# Patient Record
Sex: Male | Born: 1935 | Race: White | Hispanic: No | Smoking: Former smoker
Health system: Southern US, Community
[De-identification: ages and names within clinical notes are randomized; demographics above are authoritative.]

## PROBLEM LIST (undated history)

## (undated) DIAGNOSIS — IMO0002 Reserved for concepts with insufficient information to code with codable children: Secondary | ICD-10-CM

## (undated) DIAGNOSIS — I714 Abdominal aortic aneurysm, without rupture, unspecified: Secondary | ICD-10-CM

## (undated) DIAGNOSIS — C679 Malignant neoplasm of bladder, unspecified: Secondary | ICD-10-CM

## (undated) DIAGNOSIS — M7550 Bursitis of unspecified shoulder: Secondary | ICD-10-CM

## (undated) DIAGNOSIS — E785 Hyperlipidemia, unspecified: Secondary | ICD-10-CM

## (undated) DIAGNOSIS — R0609 Other forms of dyspnea: Secondary | ICD-10-CM

## (undated) DIAGNOSIS — R058 Other specified cough: Secondary | ICD-10-CM

## (undated) DIAGNOSIS — Z8719 Personal history of other diseases of the digestive system: Secondary | ICD-10-CM

## (undated) DIAGNOSIS — J479 Bronchiectasis, uncomplicated: Secondary | ICD-10-CM

## (undated) DIAGNOSIS — J449 Chronic obstructive pulmonary disease, unspecified: Secondary | ICD-10-CM

## (undated) DIAGNOSIS — R05 Cough: Secondary | ICD-10-CM

## (undated) DIAGNOSIS — E119 Type 2 diabetes mellitus without complications: Secondary | ICD-10-CM

## (undated) DIAGNOSIS — R351 Nocturia: Secondary | ICD-10-CM

## (undated) DIAGNOSIS — R0600 Dyspnea, unspecified: Secondary | ICD-10-CM

## (undated) DIAGNOSIS — R350 Frequency of micturition: Secondary | ICD-10-CM

## (undated) DIAGNOSIS — Z8711 Personal history of peptic ulcer disease: Secondary | ICD-10-CM

## (undated) DIAGNOSIS — M1990 Unspecified osteoarthritis, unspecified site: Secondary | ICD-10-CM

## (undated) DIAGNOSIS — Z973 Presence of spectacles and contact lenses: Secondary | ICD-10-CM

## (undated) DIAGNOSIS — R3915 Urgency of urination: Secondary | ICD-10-CM

## (undated) DIAGNOSIS — K219 Gastro-esophageal reflux disease without esophagitis: Secondary | ICD-10-CM

## (undated) DIAGNOSIS — L97529 Non-pressure chronic ulcer of other part of left foot with unspecified severity: Secondary | ICD-10-CM

## (undated) DIAGNOSIS — Z8679 Personal history of other diseases of the circulatory system: Secondary | ICD-10-CM

## (undated) HISTORY — PX: CATARACT EXTRACTION W/ INTRAOCULAR LENS  IMPLANT, BILATERAL: SHX1307

## (undated) HISTORY — DX: Chronic obstructive pulmonary disease, unspecified: J44.9

## (undated) HISTORY — PX: APPENDECTOMY: SHX54

---

## 1969-03-19 HISTORY — PX: OTHER SURGICAL HISTORY: SHX169

## 1979-03-20 HISTORY — PX: RETINAL DETACHMENT SURGERY: SHX105

## 1999-05-29 ENCOUNTER — Ambulatory Visit (HOSPITAL_COMMUNITY): Admission: RE | Admit: 1999-05-29 | Discharge: 1999-05-29 | Payer: Self-pay | Admitting: Gastroenterology

## 2000-01-22 ENCOUNTER — Encounter: Payer: Self-pay | Admitting: Family Medicine

## 2000-01-22 ENCOUNTER — Encounter: Admission: RE | Admit: 2000-01-22 | Discharge: 2000-01-22 | Payer: Self-pay | Admitting: Family Medicine

## 2000-06-01 ENCOUNTER — Encounter: Admission: RE | Admit: 2000-06-01 | Discharge: 2000-06-01 | Payer: Self-pay | Admitting: Family Medicine

## 2000-06-01 ENCOUNTER — Encounter: Payer: Self-pay | Admitting: Family Medicine

## 2001-07-28 ENCOUNTER — Emergency Department (HOSPITAL_COMMUNITY): Admission: EM | Admit: 2001-07-28 | Discharge: 2001-07-29 | Payer: Self-pay | Admitting: Emergency Medicine

## 2001-10-27 ENCOUNTER — Encounter: Admission: RE | Admit: 2001-10-27 | Discharge: 2001-10-27 | Payer: Self-pay | Admitting: Family Medicine

## 2001-10-27 ENCOUNTER — Encounter: Payer: Self-pay | Admitting: Family Medicine

## 2002-11-27 ENCOUNTER — Encounter: Payer: Self-pay | Admitting: Specialist

## 2002-12-05 ENCOUNTER — Inpatient Hospital Stay (HOSPITAL_COMMUNITY): Admission: RE | Admit: 2002-12-05 | Discharge: 2002-12-11 | Payer: Self-pay | Admitting: Specialist

## 2002-12-05 HISTORY — PX: TOTAL KNEE ARTHROPLASTY: SHX125

## 2002-12-07 ENCOUNTER — Encounter: Payer: Self-pay | Admitting: Specialist

## 2002-12-07 ENCOUNTER — Encounter: Payer: Self-pay | Admitting: Cardiology

## 2002-12-07 ENCOUNTER — Encounter: Payer: Self-pay | Admitting: Internal Medicine

## 2002-12-07 HISTORY — PX: TRANSTHORACIC ECHOCARDIOGRAM: SHX275

## 2002-12-11 ENCOUNTER — Inpatient Hospital Stay (HOSPITAL_COMMUNITY)
Admission: RE | Admit: 2002-12-11 | Discharge: 2002-12-19 | Payer: Self-pay | Admitting: Physical Medicine & Rehabilitation

## 2003-05-29 ENCOUNTER — Observation Stay (HOSPITAL_COMMUNITY): Admission: RE | Admit: 2003-05-29 | Discharge: 2003-05-30 | Payer: Self-pay | Admitting: Orthopedic Surgery

## 2003-05-29 ENCOUNTER — Encounter (INDEPENDENT_AMBULATORY_CARE_PROVIDER_SITE_OTHER): Payer: Self-pay | Admitting: Specialist

## 2003-05-29 HISTORY — PX: OTHER SURGICAL HISTORY: SHX169

## 2005-02-15 ENCOUNTER — Encounter: Admission: RE | Admit: 2005-02-15 | Discharge: 2005-02-15 | Payer: Self-pay | Admitting: Family Medicine

## 2005-03-02 ENCOUNTER — Ambulatory Visit (HOSPITAL_COMMUNITY): Admission: RE | Admit: 2005-03-02 | Discharge: 2005-03-03 | Payer: Self-pay | Admitting: Orthopedic Surgery

## 2005-03-02 HISTORY — PX: SHOULDER ARTHROSCOPY WITH ROTATOR CUFF REPAIR AND SUBACROMIAL DECOMPRESSION: SHX5686

## 2005-03-05 ENCOUNTER — Inpatient Hospital Stay (HOSPITAL_COMMUNITY): Admission: EM | Admit: 2005-03-05 | Discharge: 2005-03-12 | Payer: Self-pay | Admitting: Emergency Medicine

## 2005-07-15 ENCOUNTER — Encounter: Admission: RE | Admit: 2005-07-15 | Discharge: 2005-07-15 | Payer: Self-pay | Admitting: Family Medicine

## 2007-01-30 ENCOUNTER — Encounter: Admission: RE | Admit: 2007-01-30 | Discharge: 2007-01-30 | Payer: Self-pay | Admitting: Family Medicine

## 2007-05-04 ENCOUNTER — Encounter: Admission: RE | Admit: 2007-05-04 | Discharge: 2007-05-04 | Payer: Self-pay | Admitting: Family Medicine

## 2007-08-15 ENCOUNTER — Ambulatory Visit: Payer: Self-pay | Admitting: Vascular Surgery

## 2008-05-21 ENCOUNTER — Ambulatory Visit: Payer: Self-pay | Admitting: Vascular Surgery

## 2009-06-10 ENCOUNTER — Ambulatory Visit: Payer: Self-pay | Admitting: Vascular Surgery

## 2009-10-10 ENCOUNTER — Emergency Department (HOSPITAL_COMMUNITY): Admission: EM | Admit: 2009-10-10 | Discharge: 2009-10-10 | Payer: Self-pay | Admitting: Emergency Medicine

## 2010-01-23 ENCOUNTER — Ambulatory Visit (HOSPITAL_COMMUNITY): Admission: RE | Admit: 2010-01-23 | Discharge: 2010-01-23 | Payer: Self-pay | Admitting: Orthopedic Surgery

## 2010-06-22 ENCOUNTER — Ambulatory Visit: Payer: Self-pay | Admitting: Vascular Surgery

## 2010-08-09 ENCOUNTER — Encounter: Payer: Self-pay | Admitting: Orthopedic Surgery

## 2010-10-11 LAB — CBC
Hemoglobin: 14.6 g/dL (ref 13.0–17.0)
MCV: 95.4 fL (ref 78.0–100.0)
RBC: 4.51 MIL/uL (ref 4.22–5.81)
RDW: 13.9 % (ref 11.5–15.5)

## 2010-10-11 LAB — COMPREHENSIVE METABOLIC PANEL
AST: 36 U/L (ref 0–37)
Alkaline Phosphatase: 61 U/L (ref 39–117)
CO2: 28 mEq/L (ref 19–32)
Calcium: 9.7 mg/dL (ref 8.4–10.5)
GFR calc non Af Amer: >60 mL/min (ref >60)
Glucose, Bld: 122 mg/dL — ABNORMAL HIGH (ref 70–99)
Potassium: 4.6 mEq/L (ref 3.5–5.1)
Total Protein: 6.9 g/dL (ref 6.0–8.3)

## 2010-10-11 LAB — PROTIME-INR: Prothrombin Time: 14 seconds (ref 11.6–15.2)

## 2010-10-11 LAB — APTT: aPTT: 28 seconds (ref 24–37)

## 2010-11-19 ENCOUNTER — Other Ambulatory Visit: Payer: Self-pay | Admitting: Gastroenterology

## 2010-12-01 NOTE — Procedures (Signed)
DUPLEX ULTRASOUND OF ABDOMINAL AORTA   INDICATION:  Followup, abdominal aortic aneurysm.   HISTORY:  Diabetes:  No.  Cardiac:  Arrhythmia.  Hypertension:  No.  Smoking:  Quit about 10 years ago.  Connective Tissue Disorder:  Family History:  Previous Surgery:   DUPLEX EXAM:         AP (cm)                   TRANSVERSE (cm)  Proximal             2.93 cm                   2.95 cm  Mid                  3.37 cm                   3.51 cm  Distal               2.60 cm                   2.38 cm  Right Iliac          1.54 cm                   1.50 cm  Left Iliac           1.41 cm                   1.52 cm   PREVIOUS:  Date:  AP:  3.36  TRANSVERSE:  3.28   IMPRESSION:  Abdominal aortic aneurysm noted with the largest  measurement of (3.37 cm X 3.51 cm).   ___________________________________________  Quita Skye Hart Rochester, M.D.   MG/MEDQ  D:  08/15/2007  T:  08/16/2007  Job:  [id number]

## 2010-12-01 NOTE — Assessment & Plan Note (Signed)
OFFICE VISIT   Patrick Lane, Patrick Lane  DOB:  05/07/1928                                       05/21/2008  [id number]#:[id number]   The patient returns today for continued followup regarding his abdominal  aortic aneurysm which was discovered by Dr. Lajoyce Corners a few years ago.  He has had no abdominal or back symptoms and the duplex scan in our  office today reveals the aneurysm to be 3.6 x 3.5 cm maximum diameter  which is only slightly larger than the previous study done in January of  2009.  He continues to have no active cardiac symptoms but does have  emphysema which is a chronic problem.  He has a history of cardiac  arrhythmias in 2004 after his right knee replacement but this has not  recurred.  He denies any active chest pain at this point.  He does not  ambulate long distances because of his right knee but denies  claudication or rest pain.   PHYSICAL EXAMINATION:  Vital signs:  On physical exam today his blood  pressure is 163/77, heart rate 92, respirations 14.  Carotid pulses 3+,  no audible bruits.  Neurologic:  Exam is normal.  No palpable adenopathy  in the neck.  Chest:  Reveals some expiratory wheezing.  Cardiovascular:  Regular rhythm with no murmurs.  Abdomen:  Soft, nontender with no  pulsatile mass noted on exam today.  He has 3+ femoral pulses  bilaterally with well-perfused lower extremities.   I reassured him regarding these findings and we will follow him on an  annual basis on the aneurysm protocol to be certain that this does not  enlarge.  If he has any abdominal or back symptoms he will be in touch  with [place].   Quita Skye Hart Rochester, M.D.  Electronically Signed   JDL/MEDQ  D:  05/21/2008  T:  05/22/2008  Job:  [id number]

## 2010-12-01 NOTE — Procedures (Signed)
DUPLEX ULTRASOUND OF ABDOMINAL AORTA   INDICATION:  Abdominal aortic aneurysm.   HISTORY:  Diabetes:  No.  Cardiac:  Arrhythmia.  Hypertension:  No.  Smoking:  Previous.  Connective Tissue Disorder:  Family History:  No.  Previous Surgery:  No.   DUPLEX EXAM:         AP (cm)                   TRANSVERSE (cm)  Proximal             2.6 cm                    2.6 cm  Mid                  2.1 cm                    2.0 cm  Distal               3.7 cm                    3.7 cm  Right Iliac          1.4 cm                    1.5 cm  Left Iliac           1.5 cm                    1.8 cm   PREVIOUS:  Date:  06/10/2009  AP:  3.7  TRANSVERSE:  3.6   IMPRESSION:  Aneurysmal dilatation of the mid to distal abdominal aorta  with no significant change in the maximal diameter when compared to the  previous exam.  Decreased visualization of the bilateral common iliac  arteries noted due to overlying bowel gas patterns and patient body  habitus.   ___________________________________________  Patrick Lane, M.D.   CH/MEDQ  D:  06/22/2010  T:  06/22/2010  Job:  [id number]

## 2010-12-01 NOTE — Procedures (Signed)
DUPLEX ULTRASOUND OF ABDOMINAL AORTA   INDICATION:  Follow up abdominal aortic aneurysm.   HISTORY:  Diabetes:  No.  Cardiac:  Arrhythmia.  Hypertension:  No.  Smoking:  Previous.  Connective Tissue Disorder:  Family History:  No.  Previous Surgery:  No.   DUPLEX EXAM:         AP (cm)                   TRANSVERSE (cm)  Proximal             2.9 cm                    2.95 cm  Mid                  3.6 cm                    3.5 cm  Distal               2.6 cm                    2.8 cm  Right Iliac          1.3 cm                    1.4 cm  Left Iliac           1.5 cm                    1.5 cm   PREVIOUS:  Date: 08/15/2007  AP:  3.37  TRANSVERSE:  3.51   IMPRESSION:  Aneurysm of the mid to distal abdominal aorta with no  significant change in the maximum diameter measurement noted when  compared to the previous exam.   ___________________________________________  Quita Skye. Hart Rochester, M.D.   CH/MEDQ  D:  05/21/2008  T:  05/21/2008  Job:  [id number]

## 2010-12-01 NOTE — Procedures (Signed)
DUPLEX ULTRASOUND OF ABDOMINAL AORTA   INDICATION:  Followup of abdominal aortic aneurysm.   HISTORY:  Diabetes:  no  Cardiac:  Arrhythmia  Hypertension:  no  Smoking:  previous  Connective Tissue Disorder:  Family History:  no  Previous Surgery:  No   DUPLEX EXAM:         AP (cm)                   TRANSVERSE (cm)  Proximal             3.7 cm                    3.6 cm  Mid                  3.5 cm                    3.5 cm  Distal               2.4 cm                    2.5 cm  Right Iliac          1.5 cm                    1.5 cm  Left Iliac           1.02 cm                   1.6 cm   PREVIOUS:  Date:  AP:  3.6  TRANSVERSE:  3.5   IMPRESSION:  1. Abdominal aortic aneurysm with largest measurement of 3.5 x 3.5 cm.  2. Abdominal aortic aneurysm remaining stable with previous studies.   ___________________________________________  Quita Skye Hart Rochester, M.D.   CJ/MEDQ  D:  06/10/2009  T:  06/10/2009  Job:  [id number]

## 2010-12-04 NOTE — Discharge Summary (Signed)
NAME:  Patrick Lane, Patrick Lane NO.:  [id number]   MEDICAL RECORD NO.:  [id number]                   PATIENT TYPE:  IPS   LOCATION:  4145                                 FACILITY:  [facility]   PHYSICIAN:  Ranelle Oyster, M.D.             DATE OF BIRTH:  08/26/1927   DATE OF ADMISSION:  12/11/2002  DATE OF DISCHARGE:  12/19/2002                                 DISCHARGE SUMMARY   DISCHARGE DIAGNOSES:  1. Status post right total knee arthroplasty secondary to degenerative joint     disease.  2. History of chronic obstructive pulmonary disease.  3. History of new diagnosis of atrial fibrillation.  4. Anemia.  5. Insomnia.   HISTORY OF PRESENT ILLNESS:  The patient is a 77 year old white male with a  past medical history of severe right knee pain and emphysema, admitted on  Dec 05, 2002, for right total knee arthroplasty secondary to degenerative  joint disease by Dr. Ronnell Guadalajara.  The patient was placed on Lovenox for  deep venous thrombosis prophylaxis.  Physical therapy report at this time  states that the patient is ambulating moderate assistance 10 feet with a  rolling walker, is touchdown weightbearing, and has a knee immobilizer.   Hospital course was significant for anemia, chronic obstructive pulmonary  disease exacerbation, newly diagnosed atrial fibrillation, and Coumadin for  deep venous thrombosis prophylaxis.  The patient was placed on Coumadin for  deep venous thrombosis prophylaxis and cerebrovascular accident prophylaxis,  started on prednisone for chronic obstructive pulmonary disease  exacerbation.  The patient was transferred to the [facility]  Department on Dec 11, 2002.   PAST MEDICAL HISTORY:  Significant for as above.  Denies any coronary artery  disease, hypertension, cerebrovascular accident, diabetes mellitus.   ALLERGIES:  CELEBREX.   REVIEW OF SYMPTOMS:  Significant for chest pain and shortness of breath.   PAST SURGICAL HISTORY:  1. Bilateral cataracts.  2. Appendectomy.  3. Detached retina.   PRIMARY CARE PHYSICIAN:  L. Lupe Carney, M.D.   ADMISSION MEDICATIONS:  1. Neurontin 400 mg at bedtime.  2. Lodine 40 mg b.i.d.   SOCIAL HISTORY:  The patient lives with wife in an one level home in  [place], with 3-4 steps at entry, independent prior to admission,  still driving.  Wife able to assist.  Quit smoking.  Retired Psychologist, forensic.  Occasional alcohol use.   HOSPITAL COURSE:  Patrick Lane was admitted to [facility] Department on Dec 11, 2002, for a comprehensive inpatient  rehabilitation where he received more then three hours of therapy daily.  Hospital course was significant for the following:   Problem #1.  Status post right total knee arthroplasty:  Overall, Patrick Lane  made great progress during his eight day stay in rehabilitation.  He  remained touchdown weightbearing throughout his entire stay, and Dr. Montez Morita  advised the patient to  keep the knee immobilizer on at all times with no  active range of motion, and to follow up with him.  The patient's pain was  controlled on OxyContin with oxycodone.  The surgical incision healed well.  Staples were removed prior to discharge, and showed no signs of infection.   The patient was discharged at modified independent level, was ambulating 100  feet with standard walker.  The patient was overall able to tolerate therapy  very well and made good progress.   Problem #2.  Histor of chronic obstructive pulmonary disease with recent  exacerbation:  The patient was continued on his prednisone taper and, on  admission, was started on Atrovent, albuterol, as well as Humibid LA.  The  patient had significant shortness of breath and coughing while in  rehabilitation.  Atrovent and albuterol were discontinued on Dec 13, 2002,  and he was started on Combivent two puffs t.i.d.  Humibid was also   discontinued on Dec 17, 2002.   Problem #3.  Deep venous thrombosis/cerebrovascular accident prophylaxis:  The patient remained on Coumadin throughout his entire stay in  rehabilitation.  Unsure if the patient will need to be on Coumadin  indefinitely or for a long period of time due to new diagnosis of atrial  fibrillation.  Dr. Clovis Riley can made a decision on how long the patient  should be on Coumadin or if the patient is to be on Coumadin chronically.   Problem #4.  Anemia:  The patient remained on Trinsicon one tablet p.o.  b.i.d.  The patient had an initial hemoglobin of 10.8, and hematocrit of  31.2.  No bleeding complications were noted while the patient was on  Coumadin.   Problem #5.  New diagnosis of atrial fibrillation:  The patient's heart  remained in regular rate and rhythm.  He remained on Cardizem 180 mg p.o.  daily.  The patient is to follow up with Dr. Lupe Carney at time of  discharge.   Problem #6.  Insomnia:  The patient did complain of inability to sleep.  He  remained on Desyrel 100 mg p.o. at bedtime throughout his entire stay in  rehabilitation.  The patient was able to sleep after Desyrel was increased  from 50 mg to 100 mg.   Of note, the patient did receive Kayexalate by accident on December 18, 2002.  Stat potassium was performed on December 18, 2002, and was 4.5.  The patient did  not experience any significant bowel movement or diarrhea.   LABORATORY DATA:  Latest laboratories indicate the patient's hemoglobin is  10.8, hematocrit 31.2, white blood cell count 9.7, platelet count 400.  Latest sodium 140, potassium 3.9, chloride 102, CO2 31, glucose 95, BUN 17,  creatinine 0.9.  The patient had an urine culture sent on Dec 11, 2002, no  growth x1 day.  The patient's latest AST was 22, ALT 29.   At time of discharge, all vitals were stable.  Physical therapy report  indicated that the patient was ambulating approximately 150 feet with modified independence  with a standard walker, he could transfer sit-to-stand  modified independent.  Bed mobility was modified independent.  The patient  could perform most activities of daily living modified independent.  The  patient is discharged home with his family.   DISCHARGE MEDICATIONS:  1. Cardizem 180 mg one tablet daily.  2. Neurontin 400 mg at bedtime.  3. Prednisone follow taper.  4. Trinsicon one tablet b.i.d.  5. Combivent two puffs t.i.d.  6. Desyrel 100 mg one tablet q.p.m.  7. OxyContin 10 mg q.12 h.  8. Oxycodone 5-10 mg 1-2 tablets q.4-6 h. p.r.n.  9. Coumadin 7.5 mg daily.  10.      No Lodine, no ice, no ibuprofen or Aleve while on Coumadin.   PAIN MANAGEMENT:  OxyContin, oxycodone, and Tylenol.   DISCHARGE INSTRUCTIONS:  No drinking alcohol, no smoking, no driving.   ACTIVITY:  He is to use a walker.  He is touchdown weightbearing.  Needs  knee immobilizer at all times.  The patient will have [facility]  for physical therapy and occupational therapy, R.N.  R.N. to monitor  Coumadin and INR, and to draw it on Thursday, December 21, 2002, and call results  to Dr. Lupe Carney.   FOLLOWUP:  1. He is to follow up with Dr. Lupe Carney in three weeks.  Call for an     appointment.  2. Follow up with Dr. Myrtie Neither this week.  3. Follow up with Dr. Riley Kill p.r.n.     Loyce L. Manson Passey, P.A.-C                    Ranelle Oyster, M.D.    Joya San  D:  12/19/2002  T:  12/19/2002  Job:  [id number]   cc:   L. Lupe Carney, M.D.  [address] [place]  [place] [address]  Fax: [phone]   Ronnell Guadalajara, M.D.  [address]  [place]  [place] [address]  Fax: [phone]

## 2010-12-04 NOTE — H&P (Signed)
NAME:  ADIT, RIDDLES NO.:  [id number]   MEDICAL RECORD NO.:  [id number]                   PATIENT TYPE:  INP   LOCATION:  NA                                   FACILITY:  [facility]   PHYSICIAN:  Ronnell Guadalajara, M.D.                DATE OF BIRTH:  1928/01/09   DATE OF ADMISSION:  12/05/2002  DATE OF DISCHARGE:                                HISTORY & PHYSICAL   CHIEF COMPLAINT:  Right knee pain.   HISTORY OF PRESENT ILLNESS:  The patient is a 77 year old male with a long  history of severe right knee pain.  He complains of popping and giving way  over the lateral joint and cannot hardly get around.  Back in the 1970s he  had a compound fracture of the distal femur treated by Dr. Fannie Knee.  He said  that the pin kept slipping in and out and eventually he had the pin taken  out.  He had a patellectomy on that side.  He is to the point now where the  pain is interfering with his daily life and he wishes to have something  definitively done.  Dr. Montez Morita felt it was best to go ahead and undergo  right total knee replacement.  Risks and benefits of the surgery are  discussed with the patient and the patient wishes to proceed.   PAST MEDICAL HISTORY:  Emphysema.   PAST SURGICAL HISTORY:  1. Right knee surgery.  2. Appendectomy.   ALLERGIES:  CELEBREX causes a rash.   SOCIAL HISTORY:  Quit smoking two years ago.  Positive social ETOH.  Positive social alcohol.  He is married and lives in a one story house with  three steps entering the house.   FAMILY HISTORY:  Mother deceased with breast cancer.  Father unremarkable.   REVIEW OF SYSTEMS:  GENERAL:  Denies fevers, chills, night sweats, bleeding  tendencies.  CNS:  Denies blurry or double vision, seizures, headaches,  paralysis.  RESPIRATORY:  Positive shortness of breath with exertion.  Denies productive cough, hemoptysis.  CARDIOVASCULAR:  Denies chest pain,  angina, or orthopnea.  GASTROINTESTINAL:  Denies  nausea, vomiting, diarrhea,  constipation, melena, bloody stools.  GENITOURINARY:  Denies dysuria or  discharge.  MUSCULOSKELETAL:  Pertinent to HPI.   PHYSICAL EXAMINATION:  VITAL SIGNS:  Blood pressure 150/70, pulse 96,  respirations 16.  GENERAL:  Well-developed, well-nourished, 77 year old male.  HEENT:  Normocephalic, atraumatic.  Pupils equal, round, and react to light.  NECK:  Supple.  No carotid bruit noted.  CHEST:  Equal breath sounds bilaterally.  Positive wheezes in lung bases.  HEART:  Regular rate and rhythm.  No murmurs, rubs, or gallops.  ABDOMEN:  Mild, nontender, nondistended.  Positive bowel sounds x4.  EXTREMITIES:  Tender to palpation in bilateral joint lines.  Decreased range  of motion.  Pain on range of motion.  Range of motion is from  0-9 degrees.  He is neurovascularly intact distally.  SKIN:  No rashes or lesions.  X-rays reveal severe bone-on-bone contact with  the right knee.    IMPRESSION:  1. Osteoarthritis, right knee.  2. Emphysema.   PLAN:  The patient will be admitted to the [facility] on Dec 05, 2002, and undergo right total knee arthroplasty by Dr. Debria Garret.        Clarene Reamer, P.A.-C.                   Ronnell Guadalajara, M.D.    SW/MEDQ  D:  11/29/2002  T:  11/29/2002  Job:  [id number]

## 2010-12-04 NOTE — Op Note (Signed)
NAME:  Patrick Lane, Patrick Lane NO.:  [id number]   MEDICAL RECORD NO.:  [id number]                   PATIENT TYPE:  AMB   LOCATION:  DAY                                  FACILITY:  [facility]   PHYSICIAN:  Madlyn Frankel. Charlann Boxer, M.D.               DATE OF BIRTH:  [birth year]   DATE OF PROCEDURE:  05/29/2003  DATE OF DISCHARGE:                                 OPERATIVE REPORT   PREOPERATIVE DIAGNOSES:  Arthrofibrosis, status post right total knee  replacement, associated with painful saphenous neuroma.   POSTOPERATIVE DIAGNOSES:  Arthrofibrosis, status post right total knee  replacement, associated with painful saphenous neuroma.   OPERATION/PROCEDURE:  1. Excision of right saphenous neuroma.  2. Arthroscopic lysis of adhesions with manipulation under anesthesia.   SURGEON:  Madlyn Frankel. Charlann Boxer, M.D.   ANESTHESIA:  Epidural plus MAC.   ESTIMATED BLOOD LOSS:  Minimal.   COMPLICATIONS:  None apparent.   DISPOSITION:  Stable to recovery room.   INDICATIONS FOR PROCEDURE:  Patrick Lane is a pleasant 77 year old gentleman  who is status post right total knee replacement in May 2004.  The patient  presented to the clinic after the procedure with complaints of stiff range  of motion and pain with maximum range of motion to 85 degrees of flexion and  pain over the medial femoral condyle.  Note that the patient's case is  interesting in that he has had a patellectomy performed in 1970.  He went on  to develop osteoarthritis and requested a total knee replacement by Dr.  Aneta Mins _____.  After discussing with the patient the fact that he has  probably reached maximum benefit and his maximum gained range of motion, his  major concern was the pain with the range of motion.  Based on this, it was  indicated to try to get some range of motion.  His preoperative range of  motion from his preoperative range of motion for the patient was about 120  degrees.  Notes from physical therapy  have revealed that he had maxed out at  90 degrees, but then slowly had been subsiding to about 80-85 degrees.   DESCRIPTION OF PROCEDURE:  The patient was brought to the operating theater.  Once adequate anesthesia and preoperative antibiotics were administered, the  patient was positioned supine on the operating table.  Right lower extremity  was then prepped and draped in the sterile fashion.   Attention was first directed to the neuroma.  He had a palpable nodule over  the medial femoral condyle and it looked grossly painful.  The patient  reported that prior to the surgical procedure, in that at night he had  intense throbbing pain right over this area.  Shelf dissection was carried  down and this nodule was identified and dissected back in its course.  It  was excised and then retipped and buried into the vastus medialis; umbilicus  was  present.  Following this, this wound was irrigated with normal saline  solution and wound was reapproximated with 3-0 Vicryl and 3-0 nylon.   At this point, an attempted at manipulation under anesthesia was carried  out.  The patient had pretty rigid block at 85 degrees of flexion.  Based on  this and his previous history of patellectomy, it was opted to do go  directly to arthroscopic lysis of adhesions.   Arthroscopic portals were created in a standard fashion with inferolateral  and inferolateral cannula and inflow and outflow superolateral and working  portal inferomedial.  Evaluation of the knee revealed not an overabundance  amount of scar tissue but enough present in the anterior and medial aspects  of the knee as well as in the suprapatellar pouch to warrant my debridement.  A _2 shaver was introduced to debride and lyse adhesions present in the  suprapatellar pouch.  The medial and lateral gutters as well as the anterior  aspect of the knee.  Following this careful debridement to avoid damage to  knee replacement component, a second  manipulation was carried out.  With  this manipulation, give of the knee with some lysis of further adhesions  allowing passive flexion to 110 degrees in the operating room.  With this to  prevent further damage or potential damage to the extension mechanism, the  wound was drained and the portal reapproximated using 3-0 nylon.  Following  this, the wounds dried and cleaned and dressed with Adaptic dressing,  dressing sponges, bulky dressing, and ice pack.  The patient was taken to  the recovery room.   PLAN:  The plan will require the patient to be on epidural anesthesia for  CPM for one to two days followed by discharge with CPM at home.  We will put  home health and outpatient physical therapy to try to improve his range of  motion, hopefully with excision of the neuroma, his pain on the medial side  will dissipate and we will be able to maximize his range of motion and be  satisfied. Given what was seen, it is very unlikely that he will get maximum  range of motion of this knee, but I feel that if we can eliminate his source  of pain, he would be satisfied.                                               Madlyn Frankel Charlann Boxer, M.D.    MDO/MEDQ  D:  05/29/2003  T:  05/29/2003  Job:  [id number]

## 2010-12-04 NOTE — H&P (Signed)
NAME:  Patrick Lane, Patrick Lane NO.:  [id number]   MEDICAL RECORD NO.:  [id number]          PATIENT TYPE:  INP   LOCATION:  1844                         FACILITY:  [facility]   PHYSICIAN:  Theone Stanley, MD   DATE OF BIRTH:  [birth year]   DATE OF ADMISSION:  03/05/2005  DATE OF DISCHARGE:                                HISTORY & PHYSICAL   CHIEF COMPLAINT:  Fever, chills, cough and chest tightness.   HISTORY OF PRESENT ILLNESS:  Patrick Lane is a very pleasant 77 year old  gentleman who recently had a left shoulder arthroscopy and debridement of  rotator cuff. Arthroscopic subthoracmal decompression and open resection  distal left clavicle by Dr. Simonne Come on March 02, 2005.  The patient went  through surgery without any difficulty. Postoperatively he had some  frequency and he was seen by Dr. Early Osmond. According to the wife and patient  he was actually doing quite well on his discharge. The patient normally does  have cough with some sputum, however since his discharge (which is the only  thing he noted), he had increased sputum production. Friday he had a sudden  change in his health including fever, chills, right leg numbness which has  resolved. A tight chest which when he coughs up his sputum he feels it  improves. The color of his sputum has now changed to a yellow-green and  there is increased sputum production. The patient was brought to the  emergency room and on evaluation a CT angiogram of the chest was performed  which did not show any pulmonary embolism. However it did show bilateral  pneumonia in the lower lobes with a possible cavitary component on the left.  Blood cultures were obtained and patient was started on antibiotics. At this  point in time the patient will be admitted.   PAST MEDICAL HISTORY:  1.  Significant for COPD. He normally only takes a Combivent inhaler at      home.  2.  Hypercholesterolemia.  3.  Recent diagnosis of benign prostatic  hypertrophy.   MEDICATIONS:  1.  Combivent.  2.  Lipitor 10 mg daily.  3.  Discharged on a multivitamin.  4.  Tylox.  5.  Robaxin.  6.  Flomax.   ALLERGIES:  CELEBREX - CAUSED HIVES AND ITCHING ABOUT 2 HOURS AFTER TAKING  THE MEDICATION.   PAST SURGICAL HISTORY:  A total knee in 2004 which is followed by Dr. Charlann Boxer.  Recent rotator cuff surgery by Dr. Simonne Come.   FAMILY HISTORY:  None.   SOCIAL HISTORY:  The patient lives in [place]. He is married and they had  children but they died in an auto accident. The patient quit smoking 5 years  ago. Prior to that he smoked one pack per day for about 50 years. He  occasionally drinks alcohol. No illicit drug use.   REVIEW OF SYSTEMS:  Please see HPI. In addition patient said his right ankle  is causing him pain. He has had this before, many years ago and was given  Prednisone and it resolved.   PHYSICAL EXAMINATION:  VITAL SIGNS:  Original temperature  of 102.9, repeat  99.6. Blood pressure originally was 150/70, repeat 132/62, pulse of 140,  repeat 107, respirations 22, repeat of 18. Saturating 92% on 2 liters.  HEENT:  Of note, the patient had some ptosis on the right side, however,  this is now new. 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 His pupil was irregular and nonreactive, however, I think this is  secondary to surgery. The left eye was equal, reactive to light and 3 mm.  Extraocular movements are intact. Ears without discharge. Throat clear,  mucosa appeared slightly dry.  NECK:  Supple, no lymphadenopathy.  HEART:  Regular rate and rhythm, no murmurs or gallops appreciated.  LUNGS:  Rhonchi bilaterally. Upper respiratory sounds.  ABDOMEN:  Soft, nontender, nondistended.  EXTREMITIES:  Right ankle appeared slightly more swollen than the left, pain  on palpation and slightly warm to the touch, however was not erythematous  and without evidence of cellulitis.  NEURO:  The patient is alert and  oriented x3, nonfocal.  GU:  Deferred.   LABS/RADIOLOGY:  White count 9 thousand, hemoglobin 13, hematocrit 40,  platelets of 222,000. INR of 1.2. Sodium 138, potassium 4.2, chloride 99,  CO2 of 29, glucose 183, BUN 9, creatinine 1.  Cardiac enzymes x3 were negative. Urinalysis showed a specific gravity of  1.035, pH of 5.5, small amount of bilirubin, wbc's 0-2/hpf, few bacteria. CT  report as indicated in the HPI.   ASSESSMENT/PLAN:  1.  Bilateral pneumonia. There is a suspicion for aspiration. The patient      denies any cough or any evidence of aspiration. I suspect that this      could be secondary to status post surgery. However to be on the safe      side I have written for a speech therapy consult for aspiration. In the      meantime I will start him on Imipenem and azithromycin. Nebulizers,      oxygen and flutter-valve. I emphasized both to the wife and the patient      that it would be very important to use his flutter-valve and try to      bring up as much sputum as possible. Blood culture have been sent, this      will need to be followed up on.   1.  Status post left rotator cuff surgery. The patient will be started on MS      Contin to provide a longer acting pain control and morphine p.r.n. Will      continue the Robaxin 500 q.6h p.r.n. and will try to inform Dr.      Simonne Come of patient's admission out of courtesy.   1.  Benign prostatic hypertrophy. I suspect this is what the patient had on      his last admission. He was started on Flomax and currently his frequency      problems have resolved. Will continue this while he is here in the      hospital.   1.  Chronic obstructive pulmonary disease. Continue on his nebulizers,      oxygen p.r.n.   1.  Hyperlipidemia. Continue on his Lipitor 10 mg one p.o. q.h.s.   1.  For prophylaxis the patient will be started on Protonix and Lovenox      subcutaneously.      Theone Stanley, MD  Electronically  Signed     AEJ/MEDQ  D:  03/05/2005  T:  03/05/2005  Job:  [id number]   cc:   L. Lupe Carney, M.D.  [address] [place]  [place] [address]  Fax: [phone]   Marlowe Kays, M.D.  [address]  [place]  [place] [address]  Fax: [phone]

## 2010-12-04 NOTE — Op Note (Signed)
NAME:  Patrick Lane, Patrick Lane NO.:  [id number]   MEDICAL RECORD NO.:  [id number]          PATIENT TYPE:  AMB   LOCATION:  DAY                          FACILITY:  [facility]   PHYSICIAN:  Marlowe Kays, M.D.  DATE OF BIRTH:  10-Oct-1927   DATE OF PROCEDURE:  03/02/2005  DATE OF DISCHARGE:                                 OPERATIVE REPORT   PREOPERATIVE DIAGNOSES:  1.  Degenerative arthritis acromioclavicular joint.  2.  Chronic impingement syndrome with rotator cuff tendinopathy, left      shoulder.   POSTOPERATIVE DIAGNOSIS:  1.  Degenerative arthritis acromioclavicular joint.  2.  Chronic impingement syndrome with rotator cuff tendinopathy, left      shoulder.   OPERATION:  1.  Left shoulder arthroscopy with debridement of rotator cuff, glenoid      labrum.  2.  Arthroscopic subacromial decompression.  3.  Open resection distal left clavicle.   SURGEON:  Marlowe Kays, M.D.   ASSISTANTDruscilla Brownie. Idolina Primer, P.A.-C.   ANESTHESIA:  General.   JUSTIFICATION FOR PROCEDURE:  He is having severe pain and both shoulders,  the left worse than the right with an MRI of the left shoulder on February 24, 2005, demonstrating tendinopathy of the biceps, subscapularis and rotator  cuff tendons with a joint effusion, subacromial bursitis and a fairly  extensive AC joint degenerative arthritis, which was visible on x-ray.  This  was also associated type 2 acromion.   DESCRIPTION OF PROCEDURE:  Prophylactic antibiotics.  He has a knee  replacement.  Satisfactory general anesthesia, beach-chair position on this  spine frame.  Left shoulder girdle was prepped with DuraPrep and draped in  sterile field.  The anatomy of the shoulder joint was marked out and  prospective incision for the distal clavicle excision, the lateral posterior  portal sites and subacromial decompression were all infiltrated with 0.5%  Marcaine with Adrenaline.  Through the posterior soft spot portal, I  atraumatically entered glenohumeral joint.  On  inspection there was some  fraying of the rotator cuff with a disruption of the labrum, particularly  the biceps tendon attachment and what appeared to be a piece of suture  material in the joint.  This was very thin and probably was several  centimeters long.  He has never had a prior shoulder surgery.  We documented  this with pictures.  I then advanced the scope between the biceps and  subscapularis anteriorly.  I made an anterior incision over a switching  stick and then placed a metal cannula followed by 4.2 shaver which I  debrided down the rotator cuff, the labrum, and we searched extensively for  this foreign body, what appeared to be foreign body material, and the joint  appeared to be free, so we assumed that it had irrigated out or evacuated  with the suction shaver.  Then we redirected the scope in the subacromial  area anterolateral portal.  I placed a 4.2 shaver. He had a very extensive  subacromial bursitis and after cleaning this out I was able to get enough  visualization to bring in the 90  degree [employer] vaporizer, and I then  began removing some soft tissue from these undersurface of the acromion.  I  then brought in the 4 mm oval bur and began burring down the acromion, and I  went back and forth between these three instruments until we had a wide  decompression.  This was documented with pictures with his arm to his side  and arm abducted.  I then made an open incision over the distal clavicle.  The [facility] joint was badly arthritic.  I measured off the distal 1.5 cm, marked  the clavicle at this point and then protecting the underneath clavicle with  Baby Bennett's, used a micro saw to amputate the clavicle.  I then removed  the cut fragment with towel clip and cautery.  I made sure there was no  remaining bony spicules on the parent clavicle which I then covered with  bone wax.  The wound was irrigated well with sterile  saline and the gap  filled with Gelfoam.  I then closed the interval with interrupted #1 Vicryl  and the fascia with 2-0 Vicryl and the subcu tissue, Steri-Strips on the  skin and 4-0 nylon in the portals.  All of portals in the incision and  subacromial space were likewise reinjected with 0.5% plain Marcaine,  Marcaine with Adrenaline prior to closure.  Betadine and  Adaptic were  applied over the portals. Dry sterile dressing over the clavicle excision  site.  Shoulder immobilizer applied.  He tolerated the procedure well and  was taken to the recovery room in satisfactory condition with no known  complications.           ______________________________  Marlowe Kays, M.D.     JA/MEDQ  D:  03/02/2005  T:  03/02/2005  Job:  [id number]

## 2010-12-04 NOTE — Discharge Summary (Signed)
NAME:  Patrick Lane, Patrick Lane NO.:  [id number]   MEDICAL RECORD NO.:  [id number]                   PATIENT TYPE:  INP   LOCATION:  0377                                 FACILITY:  [facility]   PHYSICIAN:  Ronnell Guadalajara, M.D.                DATE OF BIRTH:  1927-11-24   DATE OF ADMISSION:  12/05/2002  DATE OF DISCHARGE:  12/11/2002                                 DISCHARGE SUMMARY   ADMISSION DIAGNOSES:  1. Osteoarthritis right knee.  2. Emphysema.  3. Chronic obstructive pulmonary disease.   DISCHARGE DIAGNOSES:  1. Osteoarthritis right knee.  2. Emphysema.  3. Chronic obstructive pulmonary disease.  4. Mild postoperative anemia.  5. Hypoxic episode secondary to the chronic obstructive pulmonary disease     and emphysema postoperatively.  6. New onset atrial fibrillation, resolved.   OPERATION:  On Dec 05, 2002 the patient underwent right total knee  replacement arthroplasty with tibial tubercle osteotomy.   ASSISTANT:  Dr. Simonne Come assisted.   CONSULTS:  1. Rehabilitation Medicine, [facility].  2. Dr. Greggory Stallion Osei-Bonsu of internal medicine for Dr. Lupe Carney.   BRIEF HISTORY:  This 77 year old white male with progressive problems  concerning his right knee.  He has popping, giving way of the joint.  He has  severe pain with any weightbearing and range of motion of the knee.  X-rays  have showed deteriorating joint cartilage as well as tricompartmental  arthritis of the knee.  After much discussion and taking into consideration  the patient's inability to really get about despite the fact that he is a  very active fellow it was felt that he would benefit with surgical  intervention and he was admitted for the above procedure.   HOSPITAL COURSE:  Problem 1.  The patient tolerated the surgical procedure  quite well.  Unfortunately he became hypoxic during the night.  We had to  keep his oxygen about 6 liters to keep his O2 sats at 94.  We asked  for and  received the medical consult and medical management from the hospitalists of  [facility] at [facility].  Dr. Julio Sicks continued to follow the  patient throughout this hospitalization and adjusting his medication.   Problem 2.  On the third post-op day the patient became quite uncomfortable,  difficult in maintaining a awareness.  Cardiology was asked to see the  patient.  Atrial fibrillation was noted on his electrocardiogram.  An echo  was done which was essentially normal.  The medical physicians adjusted his  medications and he eventually was able to become more alert and cooperative  and working with physical therapy.  It was discussed with the patient might  be able to go home with his home health however, with the respiratory  problems as well as the fact that he had missed a considerable amount of  postoperative physical therapy that he would need an inpatient  rehabilitation.  He was seen by Dr. Thomasena Edis of [facility] and felt  that he would be an excellent candidate for the program.  Arrangements were  made for that final transfer.   Problem 3.  Orthopedically the patient progressed very nicely up until his  episode with hypoxia, etcetera and atrial fib.  Once he was able to get back  on track as far as total knee protocol was concerned we began the CTM.  The  patient able to do minimal amount of straight leg raise.  The wound was dry.  Neurovascular was intact to the left lower extremity .   Problem 4.  Medical and cardiology felt the patient was stable enough for  transfer and arrangements were made for that inpatient status.   LABORATORY VALUES IN THE HOSPITAL:  Hematologic showed a CBC with  differential completely within normal limits preoperatively.  The patient's  final hemoglobin was 10.2, hematocrit was 29.7.  Blood chemistry remained  essentially normal other then a very minimally depressed sodium 134.  The  cardiac markers showed an  elevated troponin I, the highest on these records  at 0.13.  Urinalysis essentially negative for urinary tract infection when  repeated.   The echocardiogram showed normal left ventricle, overall left ventricle  systolic function was normal.  Left ventricular ejection fraction was  estimated between 55-65%.  The electrocardiogram preoperatively showed  normal sinus rhythm, left axis deviation however, on May 21 there was atrial  fibrillation with rapid ventricular response.  After reviewing these strips  while he was on monitoring it was a sinus rhythm was seen.   The spiral CT with contrast was negative for acute pulmonary embolus.   Chest x-ray on May 11 showed heart size is normal, central pulmonary artery  is prominent, hilar and mediastinal contours are otherwise unremarkable.  Lungs remained clear.  Degenerative changes were noted to the thoracic  spine.  The chest x-ray done during his hypoxic episode showed no acute  abnormalities.   CONDITION ON DISCHARGE:  Improved and stable.   PLAN:  The patient will be transferred to [facility] for an  intensive inpatient rehabilitation program, to continue with his total knee  protocol.  We will certainly continue to ask the hospitalists of the [facility] to follow along with this patient.  When transferred he  was awake, alert, sitting in the chair very conversant and fully oriented.  He was having no dyspnea at the time of transfer.  Recommend he continue  with the  protocol as outline above.  Weightbearing as tolerated.  Sutures out in two  and a half weeks.  Return to see Dr. Montez Morita after his rehab stay.  We will  certainly follow along with the other physicians during this patient's  hospitalization at [facility].      Dooley L. Cherlynn June.                 Ronnell Guadalajara, M.D.    DLU/MEDQ  D:  12/26/2002  T:  12/26/2002  Job:  [id number]   cc:   L. Lupe Carney, M.D. [address] [place]  [place] [address]  Fax: [phone]

## 2010-12-04 NOTE — Discharge Summary (Signed)
NAME:  RAMSAY, BOGNAR NO.:  [id number]   MEDICAL RECORD NO.:  [id number]          PATIENT TYPE:  INP   LOCATION:  3703                         FACILITY:  [facility]   PHYSICIAN:  Sherin Quarry, MD      DATE OF BIRTH:  10/10/1927   DATE OF ADMISSION:  03/05/2005  DATE OF DISCHARGE:  03/12/2005                                 DISCHARGE SUMMARY   Patrick Lane is a 77 year old man who had recently undergone left  shoulder arthroscopy and debridement of his rotator cuff. This procedure had  been perform on March 02, 2005. After his discharge, he began to experience  increased cough with sputum production and then on March 04, 2005 he began  to experience fever, chills, increase chest tightness, and a cough  productive of yellowish phlegm. The patient was brought to the emergency  room. In light of his recent surgery, he underwent a CT of the chest which  showed no signs of pulmonary embolus. However, the CT of the chest did show  evidence of bilateral pneumonia involving the lower lobes. For this reason,  it was felt prudent to admit the patient to the hospital for further  treatment.   Physical exam at time of admission as described by Dr. Theone Stanley.  Temperature was 102.9, blood pressure was 150/70, pulse was initially 140  but at the time that she saw the patient was 107, respirations were 22, O2  saturation was 92% 2 liters. HEENT exam was remarkable for previous retinal  detachment involving the right eye with irregular pupil. The chest was  notable for bibasilar rhonchi with upper respiratory mild expiratory  wheezing. Cardiovascular revealed normal S1-S2 and no rubs, murmurs or  gallops. The abdomen was soft. It was nontender. There was no guarding or  rebound. Neurologic testing was within normal limits. The patient was alert  and oriented. On examination of the extremities, the patient complained of  mild discomfort in his feet which were diffusely  somewhat sore to palpation.   On admission, his white count was 9000, hemoglobin 13, platelet count was  222,000. Sodium was 138, potassium 4.2, glucose was 183, creatinine is 1,  BUN was 9. Serial cardiac enzymes were negative.   On admission, Dr. Jomarie Longs placed the patient on imipenem 500 milligrams every  6 hours and azithromycin 500 milligrams IV daily. This combination of  antibiotics was chosen because of concern about possible aspiration.  Subsequently, on March 06, 2005, Dr. Nehemiah Settle changed the patient's  antibiotic therapy to Avelox 400 milligrams IV daily and clindamycin 60  milligrams IV every 6 hours. The patient did remarkably well during his  hospitalization from the standpoint of his respiratory status. His breathing  improved and productive cough gradually resolved. He was having no  respiratory difficulty during the latter part of hospitalization.   His main problem was persistent increased foot pain. Foot pain gradually  became so severe that he had difficulty walking. There was no evidence  impairment of circulation. The foot pain was initially confined to the right  foot then the pain in the right foot resolved  and the patient complained of  pain in his left foot. Relevant laboratory studies obtained included a uric  acid level 5.2. Sedimentation rate of 94. I initially tried to treat the  patient's foot pain symptomatically; however, because the pain was extremely  troublesome and disabling, on March 10, 2005, I elected to a treated  empirically as if it represented gout even though the uric acid level was  normal. I gave him prednisone 30 milligrams x1 and then 20 milligrams the  next day. I also placed him on colchicine 0.6 milligrams b.i.d. On this  regimen, his foot pain rapidly got better. By March 11, 2005, he was  walking just fine and had very minimal pain. He wanted very strongly to go  home and therefore on March 11, 2005 the patient was discharged.    DISCHARGE DIAGNOSES:  1.  Postoperative pneumonia, possibly secondary to aspiration.  2.  Recent history of left rotator cuff surgery.  3.  Benign prostatic hypertrophy.  4.  Chronic obstructive pulmonary disease.  5.  Hyperlipidemia.  6.  Migratory foot pain possibly representing gout or pseudogout.   On discharge, the patient will continue his usual medicines, i.e. Lipitor 10  milligrams daily and a Combivent inhaler. He will also take Avelox 400  milligrams daily for five additional days. He is given prednisone 10  milligrams x 1 day, 5 milligrams x 1 day and then stop. He is also given  colchicine 0.6 milligrams b.i.d. with instructions to take this medicine for  a total of 7 days. I advised him to follow up with Dr. Lupe Carney in his  office when it was convenient next week. At the time of the patient's  discharge, he had very minimal foot pain and no objective joint swelling in  the feet. Condition at time of discharge was fair.           ______________________________  Sherin Quarry, MD     SY/MEDQ  D:  03/12/2005  T:  03/13/2005  Job:  [id number]   cc:   L. Lupe Carney, M.D.  [address] [place]  [place] [address]  Fax: [phone]   Marlowe Kays, M.D.  [address]  [place]  [place] [address]  Fax: [phone]

## 2010-12-04 NOTE — Consult Note (Signed)
NAME:  Patrick Lane, Patrick Lane NO.:  [id number]   MEDICAL RECORD NO.:  [id number]          PATIENT TYPE:  OIB   LOCATION:  1510                         FACILITY:  [facility]   PHYSICIAN:  Valetta Fuller, M.D.  DATE OF BIRTH:  [birth year]   DATE OF CONSULTATION:  03/03/2005  DATE OF DISCHARGE:                                   CONSULTATION   REASON FOR CONSULTATION:  Increased voiding symptoms x1 month.   HISTORY OF PRESENT ILLNESS:  Patrick Lane is a 77 year old male. He denies any  previous urologic history or urologic evaluation. The patient tells me that  for about 4-6 weeks he has had some increasing voiding symptoms. The patient  as a baseline appeared to have some mild to moderate obstructive and  irritative voiding symptoms but nothing terribly problematic. Approximately  a month ago he began noticing increased voiding symptoms. He currently  complains of nocturia three to four times per evening with daytime frequency  every 1-2 hours. He has urinary urgency with occasional urge incontinence,  increased hesitancy with a very weak and dribbling, stream, and some  questionable incomplete bladder emptying. He denies any dysuria or pain with  voiding and denies any hematuria. He has had no abdominal or flank pain.  Yesterday, the patient underwent a left shoulder arthroscopy with some  debridement of the rotator cuff. He apparently has done reasonably well from  an orthopedic standpoint and is to be discharged later this afternoon.  Because of these voiding symptoms, consultation was requested. The patient  is uncertain whether he has had routine PSA testing through his primary care  doctor or not.   PAST MEDICAL HISTORY:  Apparently significant for hyperlipidemia for which  he takes Lipitor. There is a question of some cardiac issues in the past  with possible atrial fibrillation, although he is on no current cardiac  medications. Apparently he did have atrial fibrillation  with a normal  echocardiogram and that was a previous problem that apparently has resolved.  The patient does have an allergy to CELEBREX. He does have a very minimal  previous tobacco use history but quit in the very distant past.   FAMILY HISTORY:  Is negative for prostate cancer.   PHYSICAL EXAMINATION TODAY:  GENERAL:  He is a somewhat elderly-appearing  male. He is alert and oriented. He is lying in bed with a left sling for his  upper extremity.  VITAL SIGNS:  He is afebrile with a blood pressure of 151/78, pulse of 90.  Respiratory effort is normal.  ABDOMEN:  Soft and nontender without obvious hepatosplenomegaly. There are  no obvious masses or tenderness. ladder was not grossly distended. There is  no evidence of obvious hernia.  GENITOURINARY:  Penis shows normal meatus. Testes are slightly atrophic  bilaterally with normal adnexal structures. There are no perineal or anal  abnormalities. Sphincter tone was normal. Rectum showed no obvious masses.  Prostate was 1-2+ with smooth landmarks. There were o other rectal masses  and seminal vesicles were nonpalpable.  EXTREMITIES:  Did not show any significant edema.   DATA:  Systemic renal  function was normal with a creatinine of 0.9. I did  not find any urinalysis data on the patient.   ASSESSMENT:  Longstanding mild voiding symptoms, now worse for the last 4-6  weeks. He has no dysuria or hematuria and does not appear to have clinical  evidence of obvious prostatitis or cystitis. A urine will be checked today,  however, as well as a culture to rule out those possibilities. I suspect he  probably has some outlet obstruction which has worsened. We will empirically  give him a dose of Flomax today and I have written him a prescription for  him to stay on medication. We will establish follow-up for him in a week in  our office. If things are markedly better will make sure his PSA is normal  and continue him on medication. If his  urination has not improved  significantly then he will potentially need cystoscopy, flow rate, and  additional evaluation, also pending his urinalysis results.           ______________________________  Valetta Fuller, M.D.     DSG/MEDQ  D:  03/03/2005  T:  03/03/2005  Job:  [id number]   cc:   L. Lupe Carney, M.D.  [address] [place]  [place] [address]  Fax: [phone]

## 2010-12-04 NOTE — Op Note (Signed)
NAME:  Patrick Lane, Patrick Lane NO.:  [id number]   MEDICAL RECORD NO.:  [id number]                   PATIENT TYPE:  INP   LOCATION:  Z610                                 FACILITY:  [facility]   PHYSICIAN:  Ronnell Guadalajara, M.D.                DATE OF BIRTH:  13-May-1928   DATE OF PROCEDURE:  12/05/2002  DATE OF DISCHARGE:                                 OPERATIVE REPORT   PREOPERATIVE DIAGNOSIS:  Degenerative arthritis, right knee.   POSTOPERATIVE DIAGNOSIS:  Degenerative arthritis, right knee.   PROCEDURE:  Right total knee replacement arthroplasty using an [employer]  system with a size 11 posterior cruciate-sacrificing femur, a size 11  standard tibial tray with a 10 mm insert.   HISTORY:  The man had a past history of fracture about the knee and  patellectomy about 20 years ago.  He had a long lateral incision proximally,  going transversely across the knee and then medially along the inside with a  second incision over that area as well.  There was more wear on the valgus  side of the knee.  He had a slight flexion contracture, and he had about 90  degrees of flexion lax.  There was more wear on the lateral side than on the  medial.   DESCRIPTION OF PROCEDURE:  These previous skin incisions were marked out.  The area was then prepped and draped routinely and after exsanguination,  upper thigh tourniquet inflated to 350 mmHg.  We elected to use a midline  incision, angling it more toward the medial side to stay away from the upper  lateral incision and extending it over to the tibial tubercle.  We elected  to use a sub-vastus approach to stay out of the quadriceps.  We opened the  capsule medially and then freed up the vastus at its posterior border.  A  nice thick flap was created even though the man has no subcutaneous tissue  with the fascia, and a bit undermined laterally.  We attempted to cut the  femur with visualization without taking loose the tibial  tubercle and it  just did not seem adequate, so we went onto a tibial tubercle osteotomy from  medial to lateral with the lateral side attached, and this facilitated  exposure.  We then did a central hole, guide pin, elected to cut 12 mm off  the distal femur with a 5 degree valgus cut, and that was accomplished.  We  then removed some of the menisci and the spine of the tibia and spurs within  the central area, and sized the femur at a size 11 using a new sizing guide  set at about 4 degrees of external rotation and lined up with the  epicondyles.  The cuts were made anterior and posterior and chamfering for  the size 11 femur, after which we cut off the remaining bits of cruciate and  fascia  menisci, brought the tibia forward.  A size 11 looked good.  I  drilled a central hole, elected to take 4 mm off the lateral side, which was  10 mm off the medial, dropped the line to the center of the ankle.  Even  though we were going to do a 0 degree cut, in anticipation we had to go back  and slope it 5 degrees.  The tibial cut was made, after which we went to the  back and came out the back of the femoral areas on both medial and lateral  side with the aid of a lamina spreader.  The remaining cruciates were  resected and bovied.  Significant bony overgrowth and spurs on the mid  central area were removed.  The femur was put on.  A trial was made.  It was  too tight.  Elected to cut 2 more of the tibia.  It was still a bit tight  and cut 2 more after that.  It came out nicely into full extension and just  fine in flexion without any lift-off.  The tibial tray was marked.  This  lined up just medial to the tibial tubercle and was stable in both flexion  and extension; however, the guide pin showed that we were way medial to the  medial malleolus.   On placing the intramedullary guide, we ran into a good stop on the tibia  about three-quarters of the way down.  I did not appreciate that he had a   tibial fracture as part of his overall injury preoperatively as it was all  the upper thigh and a knee injury, but it was felt that this stop and this  abnormality was the result of an old fracture there and that he was lined up  perfectly with the tibial tubercle and stable in both flexion and extension.  We elected to lock the tray and use that as our guide and cut the flanges  for the tibia.  We then used the water pic on everything and cement was  mixed, and then cemented the tibia first and the femur second and after the  cement was set we removed the tray, got a little bit of cement off the back  of one femur, let the tourniquet down, this was at an hour and a half, and  there was no major bleeding posteriorly.  We then inserted the 10 mm regular  tray.  After doing that, two Techmatic staples were used to reattach the  tibial tubercle.  The capsule was closed with interrupted #1 PDS suture.  The vastus medialis was not sutured as it just flopped back into position.  No lateral release was felt to be necessary.  A longer incision than usual  was done to free up proximally in hopes of not doing a tibial tubercle and  then a longer one distally so we could have a good tibial tubercle  osteotomy.  Staples were used to close that incision after the subcu tissue  was closed meticulously.  One drain was left in place.  Marcaine 0.5% was  injected into the tissues about the knee.  Ice, compression dressing, and he  goes to recovery in good condition.  Ronnell Guadalajara, M.D.    PC/MEDQ  D:  12/05/2002  T:  12/05/2002  Job:  [id number]

## 2011-10-29 ENCOUNTER — Ambulatory Visit (INDEPENDENT_AMBULATORY_CARE_PROVIDER_SITE_OTHER)
Admission: RE | Admit: 2011-10-29 | Discharge: 2011-10-29 | Disposition: A | Discharge status: Home / Self Care | Payer: Medicare Other | Source: Ambulatory Visit | Attending: Pulmonary Disease | Admitting: Pulmonary Disease

## 2011-10-29 ENCOUNTER — Ambulatory Visit (INDEPENDENT_AMBULATORY_CARE_PROVIDER_SITE_OTHER): Payer: Medicare Other | Admitting: Pulmonary Disease

## 2011-10-29 ENCOUNTER — Encounter: Payer: Self-pay | Admitting: Pulmonary Disease

## 2011-10-29 VITALS — BP 130/80 | HR 105 | Temp 98.1°F | Ht 73.0 in | Wt 216.8 lb

## 2011-10-29 DIAGNOSIS — J449 Chronic obstructive pulmonary disease, unspecified: Secondary | ICD-10-CM

## 2011-10-29 DIAGNOSIS — J479 Bronchiectasis, uncomplicated: Secondary | ICD-10-CM

## 2011-10-29 MED ORDER — MOXIFLOXACIN HCL 400 MG PO TABS: 400.0000 mg | ORAL_TABLET | Freq: Every day | ORAL | Status: AC

## 2011-10-29 NOTE — Patient Instructions (Addendum)
You have COPD - lung capacity is at 47% Trial of symbicort 160 2 puffs twice daily - RINSE MOUTH after use Chest xray shows changes of COPD OK to take cough syrup -DELSYM 2 tsp thre time daily as needed Take ZYRTEC once daily for allergies Pl also make appt for your wife with TP or me in 1- 2 weeks Avelox x 7 days

## 2011-10-29 NOTE — Progress Notes (Signed)
  Subjective:    Patient ID: Patrick Lane, male    DOB: [birth year], 78 y.o.   MRN: [id number]  HPI PCP - Clovis Riley  78 year old heavy ex-smoker, presents for evaluation of shortness breathing and cough. He smoked a pack and a half per day for 60 years before quitting in 2001. His wife sleeps in a hospital bed and he sleeps in the recliner. He reports dyspnea on walking up a hill or climbing stairs. He reports a chronic cough which is worse with green phlegm or the last 4 weeks. He's tried Mucinex DM and an over-the-counter cough syrup. He denies nocturnal wheezing, orthopnea or paroxysmal nocturnal dyspnea or pedal edema. CXR showed hyperinflation & bibasal opacities Ct chest from 5/06 on my review shows bibasal pneumonia & some evidence of bronchiectasis. SPirometry showed FEv1 of 47, FVC 61% & ratio of 57 c/w moderate-severe airway obstruction.  Past Medical History  Diagnosis Date  . COPD (chronic obstructive pulmonary disease)   . Bursitis   . High cholesterol   . Diabetes mellitus       Review of Systems  Constitutional: Negative for fever and unexpected weight change.  HENT: Positive for congestion and trouble swallowing. Negative for ear pain, nosebleeds, sore throat, rhinorrhea, sneezing, dental problem, postnasal drip and sinus pressure.   Eyes: Negative for redness and itching.  Respiratory: Positive for cough and shortness of breath. Negative for chest tightness and wheezing.   Cardiovascular: Negative for palpitations and leg swelling.  Gastrointestinal: Negative for nausea and vomiting.  Genitourinary: Negative for dysuria.  Musculoskeletal: Positive for joint swelling.  Skin: Negative for rash.  Neurological: Negative for headaches.  Hematological: Does not bruise/bleed easily.  Psychiatric/Behavioral: Negative for dysphoric mood. The patient is not nervous/anxious.        Objective:   Physical Exam  Gen. Pleasant, well-nourished, in no distress, normal  affect ENT - no lesions, no post nasal drip Neck: No JVD, no thyromegaly, no carotid bruits Lungs: no use of accessory muscles, no dullness to percussion, bibasal rales or rhonchi  Cardiovascular: Rhythm regular, heart sounds  normal, no murmurs or gallops, no peripheral edema Abdomen: soft and non-tender, no hepatosplenomegaly, BS normal. Musculoskeletal: No deformities, no cyanosis or clubbing Neuro:  alert, non focal       Assessment & Plan:

## 2011-11-05 DIAGNOSIS — J479 Bronchiectasis, uncomplicated: Secondary | ICD-10-CM | POA: Insufficient documentation

## 2011-11-05 NOTE — Assessment & Plan Note (Signed)
Trial of symbicort 160 2 puffs twice daily - RINSE MOUTH after use Chest xray shows changes of COPD OK to take cough syrup -DELSYM 2 tsp thre time daily as needed Take ZYRTEC once daily for allergies

## 2011-11-05 NOTE — Assessment & Plan Note (Signed)
Avelox x 7 days for bronchitic flare

## 2011-11-29 ENCOUNTER — Ambulatory Visit: Payer: Medicare Other | Admitting: Adult Health

## 2011-12-06 ENCOUNTER — Other Ambulatory Visit: Payer: Self-pay | Admitting: Family Medicine

## 2012-04-06 ENCOUNTER — Telehealth: Payer: Self-pay | Admitting: Pulmonary Disease

## 2012-04-06 MED ORDER — AZITHROMYCIN 250 MG PO TABS: ORAL_TABLET | ORAL | Status: DC

## 2012-04-06 NOTE — Telephone Encounter (Signed)
Spoke with pt. He is c/o prod cough with large amounts of yellow sputum, increased SOB, and wheezing x 2 days. Denies any CP, chest tightness, f/c/s. I offered appt and he declined b/c he has to stay home and care for his spouse. He states she is also a patient here and RA prescribed her azithromycin and she is doing so much better he would like this called in for him.  RA, please advise, thanks!

## 2012-04-06 NOTE — Telephone Encounter (Signed)
zpak ok 

## 2012-04-06 NOTE — Telephone Encounter (Signed)
Pt aware of RA recs. Needed nothing further was needed

## 2012-07-24 ENCOUNTER — Encounter: Payer: Self-pay | Admitting: Vascular Surgery

## 2013-04-27 ENCOUNTER — Other Ambulatory Visit: Payer: Self-pay | Admitting: Urology

## 2013-05-07 ENCOUNTER — Encounter (HOSPITAL_BASED_OUTPATIENT_CLINIC_OR_DEPARTMENT_OTHER): Payer: Self-pay | Admitting: *Deleted

## 2013-05-08 ENCOUNTER — Encounter (HOSPITAL_BASED_OUTPATIENT_CLINIC_OR_DEPARTMENT_OTHER): Payer: Self-pay | Admitting: *Deleted

## 2013-05-08 NOTE — Progress Notes (Addendum)
NPO AFTER MN. ARRIVE AT 1610. NEEDS ISTAT, EKG, AND CXR. WILL TAKE PREDNISONE AM DOS W/ SIPS OF WATER. REVIEWED [facility] GUIDELINES, WILL BRING MEDS AND INHALER.  REVIEWED CHART W/ DR FORTUNE MDA, DUE TO COPD , PFT'S MOD.-SEV. OBSTRUCTIVE AIRWAY, AND SOB W/ EXCERTION AND WHEN TALKING VIA PHONE W/ ME.  DR FORTUNE STATES HAVE PT DO COMBIVENT INHALER OVER THE WEEKEND BID.  SPOKE W/ PT ABOUT DOING COMBIVENT INHALER STARTING TONIGHT  AND BID SAT. AND SUN.  PT VERBALIZED UNDERSTANDING.

## 2013-05-08 NOTE — H&P (Signed)
History of Present Illness  Patrick Lane was sent to me recently to discuss the possibility of penile implant. We felt there were other issues. The patient was noted to have considerable microhematuria. He also a considerable phimosis with a number of voiding complaints. There was no evidence of cystitis or prostatitis. We did do urine testing with NMP 22 and that was negative. He presents today for renal ultrasound and flexible cystoscopy to further assess these issues. Voiding much improved on Rapaflo samples.   Urinalysis today shows a combination of pyuria and microhematuria.   Past Medical History Problems  1. History of  Hypercholesterolemia 272.0 2. Tobacco Use 305.1  Surgical History Problems  1. History of  Knee Surgery  Current Meds 1. Avodart 0.5 MG Oral Capsule; Therapy: 06Aug2013 to 2. MetFORMIN HCl ER 500 MG Oral Tablet Extended Release 24 Hour; Therapy: 23Jul2014 to 3. Mucinex 600 MG TBCR; Therapy: (Recorded:11Sep2014) to  Allergies Medication  1. CeleBREX CAPS  Family History Problems  1. Family history of  Family Health Status Number Of Children no children 2. Family history of  Father Deceased At Age ____ 3. Family history of  Mother Deceased At Age ____  Social History Problems  1. Caffeine Use 2 2. Occupation: Retired Denied  3. History of  Alcohol Use  Review of Systems Genitourinary, constitutional, skin, eye, otolaryngeal, hematologic/lymphatic, cardiovascular, pulmonary, endocrine, musculoskeletal, gastrointestinal, neurological and psychiatric system(s) were reviewed and pertinent findings if present are noted.  Genitourinary: urinary frequency, feelings of urinary urgency, nocturia and incontinence, but no dysuria and urinary stream does not start and stop.  Constitutional: feeling tired (fatigue).  Respiratory: shortness of breath and cough.  Musculoskeletal: joint pain.    Results/Data  renal ultrasound: Right kidney 10.6 cm. Left kidney also  10.6 cm. No evidence of hydronephrosis no evidence of solid renal mass stones or significant cysts. Postvoid residual was negligible. Normal renal ultrasound.  Physical exam  Multiple well-nourished male in no acute distress Respiratory: Normal effort Cardiac: Regular rate and rhythm Abdomen: Soft nontender no palpable masses Genitalia: Normal external genitalia Extremities: No tenderness or edema   Procedure  Procedure: Cystoscopy  Chaperone Present: Patrick Lane.  Indication: Hematuria. Lower Urinary Tract Symptoms.  Informed Consent: Risks, benefits, and potential adverse events were discussed and informed consent was obtained from the patient . Specific risks including, but not limited to bleeding, infection, pain, allergic reaction etc. were explained.  Prep: The patient was prepped with betadine.  Anesthesia:. Local anesthesia was administered intraurethrally with 2% lidocaine jelly.  Procedure Note:  Urethral meatus:. A stricture was present at the urethral meatus and was dilated.  Anterior urethra: No abnormalities.  Prostatic urethra:. There was visual obstruction of the prostatic urethra. The lateral and median prostatic lobes were enlarged.  Bladder: Visulization was clear. A solitary tumor was visualized in the bladder. A nodular tumor was seen in the bladder measuring approximately 5 cm in size. This tumor was located on the left side of the bladder. The patient tolerated the procedure well.  Complications: None.    Assessment Assessed  1. Benign Prostatic Hypertrophy With Urinary Obstruction 600.01 2. Microscopic Hematuria 599.72 3. 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 Bladder Cancer 188.9 5. Meatal Stenosis 598.9  Plan Health Maintenance (V70.0)  1. UA With REFLEX  Done: 10Oct2014 09:23AM Male Erectile Disorder Due To Physical Condition (607.84)  2. Rapaflo 8 MG Oral Capsule; TAKE 1 CAPSULE Daily; Therapy: 10Oct2014 to  (Evaluate:08May2015)   Requested for: 10Oct2014; Last Rx:10Oct2014;  Edited 3. Follow-up Month x 6 Office  Follow-up  Requested for: 10Apr2015 Microscopic Hematuria (599.72)  4. Cysto 5. Cysto w/Dilation  Done: 10Oct2014 6. Follow-up Schedule Surgery Office  Follow-up  Done: 10Oct2014  Discussion/Summary  There're number significant issues with Patrick Lane. Most importantly on cystoscopy today he did have what appears to be a nodular growth on the lateral wall of his bladder. While this may be focal inflammatory changes extremely likely to be a poorly differentiated and probably invasive transitional cell carcinoma. He deathly requires resection/biopsy. Inserted the resectoscope may be difficult on him given the degree of phimosis and meatal stenosis he'll certainly require dilation in the operating room.  Before putting him to sleep for that it may make sense to do a limited circumcision to try to improve this tissue since he really does have severe phimosis. The erectile dysfunction issues are obviously on the back burner. His voiding seems to be markedly better on alpha-blocker therapy and hopefully that in combination with the meatal dilation were really helped his urination.     Amendment  CC Patrick Lane, M.D.   Signatures Electronically signed by : Patrick Lane, M.D.; Apr 30 2013  1:24PM

## 2013-05-14 ENCOUNTER — Ambulatory Visit (HOSPITAL_COMMUNITY): Payer: Medicare Other

## 2013-05-14 ENCOUNTER — Encounter (HOSPITAL_BASED_OUTPATIENT_CLINIC_OR_DEPARTMENT_OTHER): Payer: Self-pay | Admitting: Anesthesiology

## 2013-05-14 ENCOUNTER — Encounter (HOSPITAL_BASED_OUTPATIENT_CLINIC_OR_DEPARTMENT_OTHER)
Admission: RE | Disposition: A | Discharge status: Home / Self Care | Payer: Self-pay | Source: Ambulatory Visit | Attending: Urology

## 2013-05-14 ENCOUNTER — Encounter (HOSPITAL_BASED_OUTPATIENT_CLINIC_OR_DEPARTMENT_OTHER): Payer: Medicare Other | Admitting: Anesthesiology

## 2013-05-14 ENCOUNTER — Ambulatory Visit (HOSPITAL_BASED_OUTPATIENT_CLINIC_OR_DEPARTMENT_OTHER)
Admission: RE | Admit: 2013-05-14 | Discharge: 2013-05-15 | Disposition: A | Discharge status: Home / Self Care | Payer: Medicare Other | Source: Ambulatory Visit | Attending: Urology | Admitting: Urology

## 2013-05-14 ENCOUNTER — Ambulatory Visit (HOSPITAL_BASED_OUTPATIENT_CLINIC_OR_DEPARTMENT_OTHER): Payer: Medicare Other | Admitting: Anesthesiology

## 2013-05-14 DIAGNOSIS — Z87891 Personal history of nicotine dependence: Secondary | ICD-10-CM | POA: Insufficient documentation

## 2013-05-14 DIAGNOSIS — Z79899 Other long term (current) drug therapy: Secondary | ICD-10-CM | POA: Insufficient documentation

## 2013-05-14 DIAGNOSIS — N139 Obstructive and reflux uropathy, unspecified: Secondary | ICD-10-CM | POA: Insufficient documentation

## 2013-05-14 DIAGNOSIS — N35919 Unspecified urethral stricture, male, unspecified site: Secondary | ICD-10-CM | POA: Insufficient documentation

## 2013-05-14 DIAGNOSIS — E119 Type 2 diabetes mellitus without complications: Secondary | ICD-10-CM | POA: Insufficient documentation

## 2013-05-14 DIAGNOSIS — I714 Abdominal aortic aneurysm, without rupture, unspecified: Secondary | ICD-10-CM | POA: Insufficient documentation

## 2013-05-14 DIAGNOSIS — N138 Other obstructive and reflux uropathy: Secondary | ICD-10-CM | POA: Insufficient documentation

## 2013-05-14 DIAGNOSIS — N471 Phimosis: Secondary | ICD-10-CM

## 2013-05-14 DIAGNOSIS — E7800 Pure hypercholesterolemia, unspecified: Secondary | ICD-10-CM | POA: Insufficient documentation

## 2013-05-14 DIAGNOSIS — C679 Malignant neoplasm of bladder, unspecified: Secondary | ICD-10-CM | POA: Insufficient documentation

## 2013-05-14 DIAGNOSIS — N529 Male erectile dysfunction, unspecified: Secondary | ICD-10-CM | POA: Insufficient documentation

## 2013-05-14 DIAGNOSIS — N401 Enlarged prostate with lower urinary tract symptoms: Secondary | ICD-10-CM | POA: Insufficient documentation

## 2013-05-14 DIAGNOSIS — N478 Other disorders of prepuce: Secondary | ICD-10-CM | POA: Insufficient documentation

## 2013-05-14 DIAGNOSIS — I739 Peripheral vascular disease, unspecified: Secondary | ICD-10-CM | POA: Insufficient documentation

## 2013-05-14 DIAGNOSIS — J449 Chronic obstructive pulmonary disease, unspecified: Secondary | ICD-10-CM | POA: Insufficient documentation

## 2013-05-14 DIAGNOSIS — C672 Malignant neoplasm of lateral wall of bladder: Secondary | ICD-10-CM

## 2013-05-14 DIAGNOSIS — J4489 Other specified chronic obstructive pulmonary disease: Secondary | ICD-10-CM | POA: Insufficient documentation

## 2013-05-14 HISTORY — PX: CIRCUMCISION: SHX1350

## 2013-05-14 HISTORY — DX: Dyspnea, unspecified: R06.00

## 2013-05-14 HISTORY — DX: Cough: R05

## 2013-05-14 HISTORY — DX: Hyperlipidemia, unspecified: E78.5

## 2013-05-14 HISTORY — DX: Personal history of peptic ulcer disease: Z87.11

## 2013-05-14 HISTORY — DX: Abdominal aortic aneurysm, without rupture: I71.4

## 2013-05-14 HISTORY — DX: Unspecified osteoarthritis, unspecified site: M19.90

## 2013-05-14 HISTORY — DX: Other specified cough: R05.8

## 2013-05-14 HISTORY — PX: CYSTOSCOPY WITH URETHRAL DILATATION: SHX5125

## 2013-05-14 HISTORY — DX: Non-pressure chronic ulcer of other part of left foot with unspecified severity: L97.529

## 2013-05-14 HISTORY — DX: Personal history of other diseases of the digestive system: Z87.19

## 2013-05-14 HISTORY — DX: Bursitis of unspecified shoulder: M75.50

## 2013-05-14 HISTORY — DX: Reserved for concepts with insufficient information to code with codable children: IMO0002

## 2013-05-14 HISTORY — DX: Gastro-esophageal reflux disease without esophagitis: K21.9

## 2013-05-14 HISTORY — DX: Frequency of micturition: R35.0

## 2013-05-14 HISTORY — DX: Nocturia: R35.1

## 2013-05-14 HISTORY — DX: Other forms of dyspnea: R06.09

## 2013-05-14 HISTORY — DX: Bronchiectasis, uncomplicated: J47.9

## 2013-05-14 HISTORY — DX: Type 2 diabetes mellitus without complications: E11.9

## 2013-05-14 HISTORY — DX: Personal history of other diseases of the circulatory system: Z86.79

## 2013-05-14 HISTORY — DX: Presence of spectacles and contact lenses: Z97.3

## 2013-05-14 HISTORY — PX: TRANSURETHRAL RESECTION OF BLADDER TUMOR: SHX2575

## 2013-05-14 HISTORY — DX: Urgency of urination: R39.15

## 2013-05-14 HISTORY — DX: Malignant neoplasm of bladder, unspecified: C67.9

## 2013-05-14 HISTORY — DX: Abdominal aortic aneurysm, without rupture, unspecified: I71.40

## 2013-05-14 LAB — GLUCOSE, CAPILLARY: Glucose-Capillary: 113 mg/dL — ABNORMAL HIGH (ref 70–99)

## 2013-05-14 LAB — POCT I-STAT 4, (NA,K, GLUC, HGB,HCT)
Glucose, Bld: 132 mg/dL — ABNORMAL HIGH (ref 70–99)
HCT: 42 % (ref 39.0–52.0)
Hemoglobin: 14.3 g/dL (ref 13.0–17.0)
Potassium: 4.5 mEq/L (ref 3.5–5.1)
Sodium: 142 mEq/L (ref 135–145)

## 2013-05-14 SURGERY — CYSTOSCOPY, WITH URETHRAL DILATION
Anesthesia: General | Site: Ureter | Wound class: Clean Contaminated

## 2013-05-14 MED ORDER — PROMETHAZINE HCL 25 MG/ML IJ SOLN
6.2500 mg | INTRAMUSCULAR | Status: DC | PRN
  Filled 2013-05-14: qty 1

## 2013-05-14 MED ORDER — IPRATROPIUM-ALBUTEROL 20-100 MCG/ACT IN AERS
2.0000 | INHALATION_SPRAY | Freq: Four times a day (QID) | RESPIRATORY_TRACT | Status: DC | PRN
  Filled 2013-05-14: qty 4

## 2013-05-14 MED ORDER — KETOROLAC TROMETHAMINE 30 MG/ML IJ SOLN
INTRAMUSCULAR | Status: DC | PRN
  Administered 2013-05-14: 15 mg via INTRAVENOUS

## 2013-05-14 MED ORDER — LIDOCAINE HCL (CARDIAC) 20 MG/ML IV SOLN
INTRAVENOUS | Status: DC | PRN
  Administered 2013-05-14: 60 mg via INTRAVENOUS

## 2013-05-14 MED ORDER — PREDNISONE 20 MG PO TABS
20.0000 mg | ORAL_TABLET | Freq: Every morning | ORAL | Status: DC
  Filled 2013-05-14: qty 1

## 2013-05-14 MED ORDER — HYDROCODONE-ACETAMINOPHEN 5-325 MG PO TABS
1.0000 | ORAL_TABLET | ORAL | Status: DC | PRN
  Administered 2013-05-14 – 2013-05-15 (×5): 1 via ORAL
  Filled 2013-05-14: qty 2

## 2013-05-14 MED ORDER — KCL IN DEXTROSE-NACL 20-5-0.45 MEQ/L-%-% IV SOLN
INTRAVENOUS | Status: DC
  Administered 2013-05-14: 13:00:00 via INTRAVENOUS
  Filled 2013-05-14: qty 1000

## 2013-05-14 MED ORDER — LACTATED RINGERS IV SOLN
INTRAVENOUS | Status: DC
  Administered 2013-05-14: 09:00:00 via INTRAVENOUS
  Filled 2013-05-14: qty 1000

## 2013-05-14 MED ORDER — BACITRACIN ZINC 500 UNIT/GM EX OINT
TOPICAL_OINTMENT | CUTANEOUS | Status: DC | PRN
  Administered 2013-05-14: 1 via TOPICAL

## 2013-05-14 MED ORDER — CEFAZOLIN SODIUM 1-5 GM-% IV SOLN
INTRAVENOUS | Status: DC | PRN
  Administered 2013-05-14: 1 g via INTRAVENOUS

## 2013-05-14 MED ORDER — OXYBUTYNIN CHLORIDE 5 MG PO TABS
5.0000 mg | ORAL_TABLET | Freq: Three times a day (TID) | ORAL | Status: DC | PRN
  Administered 2013-05-14: 5 mg via ORAL
  Filled 2013-05-14: qty 1

## 2013-05-14 MED ORDER — MORPHINE SULFATE 2 MG/ML IJ SOLN
2.0000 mg | INTRAMUSCULAR | Status: DC | PRN
  Filled 2013-05-14: qty 2

## 2013-05-14 MED ORDER — FENTANYL CITRATE 0.05 MG/ML IJ SOLN
25.0000 ug | INTRAMUSCULAR | Status: DC | PRN
  Filled 2013-05-14: qty 1

## 2013-05-14 MED ORDER — FENTANYL CITRATE 0.05 MG/ML IJ SOLN
INTRAMUSCULAR | Status: DC | PRN
  Administered 2013-05-14 (×3): 12.5 ug via INTRAVENOUS
  Administered 2013-05-14: 25 ug via INTRAVENOUS
  Administered 2013-05-14 (×3): 12.5 ug via INTRAVENOUS

## 2013-05-14 MED ORDER — ONDANSETRON HCL 4 MG/2ML IJ SOLN
4.0000 mg | INTRAMUSCULAR | Status: DC | PRN
  Filled 2013-05-14: qty 2

## 2013-05-14 MED ORDER — CIPROFLOXACIN IN D5W 400 MG/200ML IV SOLN
400.0000 mg | INTRAVENOUS | Status: AC
  Administered 2013-05-14: 400 mg via INTRAVENOUS
  Filled 2013-05-14: qty 200

## 2013-05-14 MED ORDER — LACTATED RINGERS IV SOLN
INTRAVENOUS | Status: DC | PRN
  Administered 2013-05-14 (×2): via INTRAVENOUS

## 2013-05-14 MED ORDER — LIDOCAINE HCL 2 % EX GEL
CUTANEOUS | Status: DC | PRN
  Administered 2013-05-14: 1 via URETHRAL

## 2013-05-14 MED ORDER — METFORMIN HCL 500 MG PO TABS
1000.0000 mg | ORAL_TABLET | Freq: Two times a day (BID) | ORAL | Status: DC
  Filled 2013-05-14: qty 2

## 2013-05-14 MED ORDER — EPHEDRINE SULFATE 50 MG/ML IJ SOLN
INTRAMUSCULAR | Status: DC | PRN
  Administered 2013-05-14 (×2): 10 mg via INTRAVENOUS

## 2013-05-14 MED ORDER — SUCCINYLCHOLINE CHLORIDE 20 MG/ML IJ SOLN
INTRAMUSCULAR | Status: DC | PRN
  Administered 2013-05-14: 60 mg via INTRAVENOUS

## 2013-05-14 MED ORDER — DM-GUAIFENESIN ER 30-600 MG PO TB12
3.0000 | ORAL_TABLET | Freq: Every day | ORAL | Status: DC
  Filled 2013-05-14: qty 3

## 2013-05-14 MED ORDER — BUPIVACAINE HCL (PF) 0.25 % IJ SOLN
INTRAMUSCULAR | Status: DC | PRN
  Administered 2013-05-14: 10 mL

## 2013-05-14 MED ORDER — PROPOFOL 10 MG/ML IV BOLUS
INTRAVENOUS | Status: DC | PRN
  Administered 2013-05-14: 20 mg via INTRAVENOUS
  Administered 2013-05-14: 180 mg via INTRAVENOUS

## 2013-05-14 MED ORDER — ONDANSETRON HCL 4 MG/2ML IJ SOLN
INTRAMUSCULAR | Status: DC | PRN
  Administered 2013-05-14: 4 mg via INTRAVENOUS

## 2013-05-14 SURGICAL SUPPLY — 72 items
BAG DRAIN URO-CYSTO SKYTR STRL (DRAIN) ×4 IMPLANT
BAG DRN ANRFLXCHMBR STRAP LEK (BAG)
BAG DRN UROCATH (DRAIN) ×3
BAG URINE DRAINAGE (UROLOGICAL SUPPLIES) ×1 IMPLANT
BAG URINE LEG 19OZ MD ST LTX (BAG) IMPLANT
BAG URINE LEG 500ML (DRAIN) IMPLANT
BALLN NEPHROSTOMY (BALLOONS) ×4
BALLOON NEPHROSTOMY (BALLOONS) ×3 IMPLANT
BANDAGE COBAN STERILE 2 (GAUZE/BANDAGES/DRESSINGS) ×4 IMPLANT
BLADE SURG 15 STRL LF DISP TIS (BLADE) ×3 IMPLANT
BLADE SURG 15 STRL SS (BLADE) ×4
CANISTER SUCT LVC 12 LTR MEDI- (MISCELLANEOUS) ×2 IMPLANT
CATH FOLEY 2WAY SLVR  5CC 16FR (CATHETERS)
CATH FOLEY 2WAY SLVR  5CC 18FR (CATHETERS)
CATH FOLEY 2WAY SLVR  5CC 20FR (CATHETERS)
CATH FOLEY 2WAY SLVR  5CC 22FR (CATHETERS)
CATH FOLEY 2WAY SLVR 5CC 16FR (CATHETERS) IMPLANT
CATH FOLEY 2WAY SLVR 5CC 18FR (CATHETERS) IMPLANT
CATH FOLEY 2WAY SLVR 5CC 20FR (CATHETERS) IMPLANT
CATH FOLEY 2WAY SLVR 5CC 22FR (CATHETERS) IMPLANT
CATH FOLEY 3WAY 30CC 22F (CATHETERS) ×1 IMPLANT
CLOTH BEACON ORANGE TIMEOUT ST (SAFETY) ×4 IMPLANT
COVER MAYO STAND STRL (DRAPES) ×1 IMPLANT
COVER TABLE BACK 60X90 (DRAPES) IMPLANT
DRAPE CAMERA CLOSED 9X96 (DRAPES) ×4 IMPLANT
DRAPE PED LAPAROTOMY (DRAPES) ×4 IMPLANT
DRSG TEGADERM 2-3/8X2-3/4 SM (GAUZE/BANDAGES/DRESSINGS) IMPLANT
DRSG VASELINE 3X18 (GAUZE/BANDAGES/DRESSINGS) ×4 IMPLANT
ELECT BUTTON BIOP 24F 90D PLAS (MISCELLANEOUS) IMPLANT
ELECT LOOP HF 26F 30D .35MM (CUTTING LOOP) IMPLANT
ELECT NDL TIP 2.8 STRL (NEEDLE) ×3 IMPLANT
ELECT NEEDLE 45D HF 24-28F 12D (CUTTING LOOP) IMPLANT
ELECT NEEDLE TIP 2.8 STRL (NEEDLE) ×4 IMPLANT
ELECT REM PT RETURN 9FT ADLT (ELECTROSURGICAL) ×4
ELECTRODE REM PT RTRN 9FT ADLT (ELECTROSURGICAL) ×3 IMPLANT
EVACUATOR MICROVAS BLADDER (UROLOGICAL SUPPLIES) IMPLANT
GAUZE SPONGE 4X4 12PLY STRL LF (GAUZE/BANDAGES/DRESSINGS) IMPLANT
GAUZE VASELINE 1X8 (GAUZE/BANDAGES/DRESSINGS) ×1 IMPLANT
GLOVE BIO SURGEON STRL SZ7.5 (GLOVE) ×4 IMPLANT
GLOVE BIOGEL M 6.5 STRL (GLOVE) ×1 IMPLANT
GLOVE BIOGEL M STER SZ 6 (GLOVE) ×1 IMPLANT
GLOVE BIOGEL PI IND STRL 7.5 (GLOVE) IMPLANT
GLOVE BIOGEL PI INDICATOR 7.5 (GLOVE) ×2
GOWN STRL REIN XL XLG (GOWN DISPOSABLE) ×5 IMPLANT
GUIDEWIRE STR DUAL SENSOR (WIRE) IMPLANT
HOLDER FOLEY CATH W/STRAP (MISCELLANEOUS) IMPLANT
IV NS IRRIG 3000ML ARTHROMATIC (IV SOLUTION) ×2 IMPLANT
KIT ASPIRATION TUBING (SET/KITS/TRAYS/PACK) IMPLANT
LOOP CUTTING 24FR OLYMPUS (CUTTING LOOP) IMPLANT
NDL HYPO 25X1 1.5 SAFETY (NEEDLE) ×6 IMPLANT
NDL SAFETY ECLIPSE 18X1.5 (NEEDLE) IMPLANT
NEEDLE HYPO 18GX1.5 SHARP (NEEDLE)
NEEDLE HYPO 22GX1.5 SAFETY (NEEDLE) IMPLANT
NEEDLE HYPO 25X1 1.5 SAFETY (NEEDLE) ×8 IMPLANT
NS IRRIG 500ML POUR BTL (IV SOLUTION) ×1 IMPLANT
PACK BASIN DAY SURGERY FS (CUSTOM PROCEDURE TRAY) ×4 IMPLANT
PACK CYSTOSCOPY (CUSTOM PROCEDURE TRAY) ×4 IMPLANT
PENCIL BUTTON HOLSTER BLD 10FT (ELECTRODE) ×4 IMPLANT
PLUG CATH AND CAP STER (CATHETERS) IMPLANT
SET ASPIRATION TUBING (TUBING) IMPLANT
SPONGE GAUZE 4X4 12PLY (GAUZE/BANDAGES/DRESSINGS) ×1 IMPLANT
SUT VIC AB 5-0 P-3 18X BRD (SUTURE) IMPLANT
SUT VIC AB 5-0 P3 18 (SUTURE)
SUT VICRYL 4-0 PS2 18IN ABS (SUTURE) ×11 IMPLANT
SYR 20CC LL (SYRINGE) IMPLANT
SYR 30ML LL (SYRINGE) ×1 IMPLANT
SYR CONTROL 10ML LL (SYRINGE) ×8 IMPLANT
SYRINGE IRR TOOMEY STRL 70CC (SYRINGE) ×1 IMPLANT
TOWEL OR 17X24 6PK STRL BLUE (TOWEL DISPOSABLE) ×8 IMPLANT
TRAY DSU PREP LF (CUSTOM PROCEDURE TRAY) ×4 IMPLANT
WATER STERILE IRR 3000ML UROMA (IV SOLUTION) ×3 IMPLANT
WATER STERILE IRR 500ML POUR (IV SOLUTION) ×1 IMPLANT

## 2013-05-14 NOTE — Transfer of Care (Signed)
Immediate Anesthesia Transfer of Care Note  Patient: Patrick Lane  Procedure(s) Performed: Procedure(s) (LRB): CYSTOSCOPY WITH MEATAL DILATION  (N/A) CIRCUMCISION ADULT (N/A) TRANSURETHRAL RESECTION OF BLADDER TUMOR (TURBT) (N/A)  Patient Location: PACU  Anesthesia Type: General  Level of Consciousness: awake, sedated, patient cooperative and responds to stimulation  Airway & Oxygen Therapy: Patient Spontanous Breathing and Patient connected to face mask oxygen  Post-op Assessment: Report given to PACU RN, Post -op Vital signs reviewed and stable and Patient moving all extremities  Post vital signs: Reviewed and stable  Complications: No apparent anesthesia complications

## 2013-05-14 NOTE — Anesthesia Preprocedure Evaluation (Addendum)
Anesthesia Evaluation  Patient identified by MRN, date of birth, ID band Patient awake    Reviewed: Allergy & Precautions, H&P , NPO status , Patient's Chart, lab work & pertinent test results  Airway Mallampati: II TM Distance: >3 FB Neck ROM: Limited    Dental no notable dental hx.    Pulmonary shortness of breath and with exertion, COPD breath sounds clear to auscultation  + decreased breath sounds      Cardiovascular + Peripheral Vascular Disease Rhythm:Regular Rate:Tachycardia  AAA (abdominal aortic aneurysm)   LAST DUPLEX ULTRASOUND 06-22-2010  3.7x3.6    Neuro/Psych negative neurological ROS  negative psych ROS   GI/Hepatic negative GI ROS, Neg liver ROS,   Endo/Other  diabetes, Oral Hypoglycemic Agents  Renal/GU negative Renal ROS  negative genitourinary   Musculoskeletal negative musculoskeletal ROS (+)   Abdominal   Peds negative pediatric ROS (+)  Hematology negative hematology ROS (+)   Anesthesia Other Findings   Reproductive/Obstetrics negative OB ROS                         Anesthesia Physical Anesthesia Plan  ASA: III  Anesthesia Plan: General   Post-op Pain Management:    Induction: Intravenous  Airway Management Planned: LMA  Additional Equipment:   Intra-op Plan:   Post-operative Plan:   Informed Consent: I have reviewed the patients History and Physical, chart, labs and discussed the procedure including the risks, benefits and alternatives for the proposed anesthesia with the patient or authorized representative who has indicated his/her understanding and acceptance.   Dental advisory given  Plan Discussed with: CRNA and Surgeon  Anesthesia Plan Comments:         Anesthesia Quick Evaluation

## 2013-05-14 NOTE — Op Note (Signed)
Preoperative diagnosis: Bladder tumor, meatal stenosis, phimosis Postoperative diagnosis: Same  Procedure: Circumcision, meatal dilation, cystoscopy, TURBT   Surgeon: Valetta Fuller M.D.  Anesthesia: Gen.  Indications: [name] is currently 79 years of age. He actually presented with issues with regard to erectile dysfunction but I found that he had considerable microhematuria. On clinical exam the patient had significant phimosis with meatal stenosis. Cystoscopically the patient was felt to have a 5 cm tumor on the left side of his bladder. He presents now for resection of the tumor. Given the severity of his phimosis we feel that a limited circumcision along with meatal dilation will be required.     Technique and findings: Patient was brought to the operating room where he had successful induction general anesthesia. He was initially placed in supine position and prepped and draped in usual manner. He received both ciprofloxacin as well some Ancef. Attention was initially turned towards circumcision. We did not fill we would be able to place a cyst scope/resectoscope given the severity of phimosis until completion of the circumcision. Appropriate surgical timeout was performed. A circumferential incision was made behind the glans penis. A dorsal slit was made to allow for retraction of the foreskin and a separate incision in the mucosal collar was then performed. The thickened and fibrotic foreskin was then removed with electrocautery. Skin edges were reapproximated with 3-0 Vicryl suture. This was done in interrupted manner.   Meatal dilation was then performed given the stenosis from approximately 18-30 [place]. A 20 French continuous resectoscope was then inserted. Saline was used as irrigant and we used gyrus instrumentation. The bladder was again carefully panendoscope. The left lateral wall the bladder contained a erythematous and thickened mass consistent with an infiltrative invasive  transitional cell carcinoma. A focal inflammatory process is a possibility but unlikely. The area of involvement was approximately 7 x 4 x 5 cm. The majority of this area was resected but we did not feel that we would be a we'll to safely and completely resect all abnormality today. He wanted to certainly debulked the majority of the tumor and obtain adequate specimen to see if indeed this is a transitional cell carcinoma and 2-gauge the depth of invasion. At completion of the procedure the bladder wall specimen was sent for permanent pathologic analysis. A 22 French three-way Foley catheter was inserted with continuous bladder irrigation. A bacitracin soaked gauze was placed around the circumferential penile incision and very light placement Coban dressing was utilized. Patient was brought to recovery room stable condition having had no obvious complications or problems.

## 2013-05-14 NOTE — Interval H&P Note (Signed)
History and Physical Interval Note:  05/14/2013 9:57 AM  Patrick Lane  has presented today for surgery, with the diagnosis of BLADDER CANCER, MEATAL STENOSIS  The various methods of treatment have been discussed with the patient and family. After consideration of risks, benefits and other options for treatment, the patient has consented to  Procedure(s): CYSTOSCOPY WITH MEATAL DILATION  (N/A) CIRCUMCISION ADULT (N/A) TRANSURETHRAL RESECTION OF BLADDER TUMOR (TURBT) (N/A) as a surgical intervention .  The patient's history has been reviewed, patient examined, no change in status, stable for surgery.  I have reviewed the patient's chart and labs.  Questions were answered to the patient's satisfaction.     [name]

## 2013-05-15 ENCOUNTER — Encounter (HOSPITAL_BASED_OUTPATIENT_CLINIC_OR_DEPARTMENT_OTHER): Payer: Self-pay | Admitting: Urology

## 2013-05-15 LAB — BASIC METABOLIC PANEL
BUN: 11 mg/dL (ref 6–23)
Calcium: 9.2 mg/dL (ref 8.4–10.5)
GFR calc non Af Amer: 74 mL/min — ABNORMAL LOW (ref >90)
Glucose, Bld: 122 mg/dL — ABNORMAL HIGH (ref 70–99)
Potassium: 4.5 mEq/L (ref 3.5–5.1)

## 2013-05-15 MED ORDER — CIPROFLOXACIN HCL 250 MG PO TABS: 250.0000 mg | ORAL_TABLET | Freq: Two times a day (BID) | ORAL | Status: DC

## 2013-05-15 MED ORDER — HYDROCODONE-ACETAMINOPHEN 5-325 MG PO TABS: 1.0000 | ORAL_TABLET | Freq: Four times a day (QID) | ORAL | Status: DC | PRN

## 2013-05-15 NOTE — Discharge Summary (Signed)
Patient ID: Patrick Lane MRN: [id number] DOB/AGE: 03/14/1928 79 y.o.  Admit date: 05/14/2013 Discharge date: 05/15/2013    Discharge Diagnoses:   Present on Admission:  **None**  Consults:  None    Discharge Medications:   Medication List    STOP taking these medications       predniSONE 20 MG tablet  Commonly known as:  DELTASONE      TAKE these medications       AVODART 0.5 MG capsule  Generic drug:  dutasteride  Take 1 tablet by mouth Daily.     ciprofloxacin 250 MG tablet  Commonly known as:  CIPRO  Take 1 tablet (250 mg total) by mouth 2 (two) times daily.     COMBIVENT RESPIMAT 20-100 MCG/ACT Aers respimat  Generic drug:  Ipratropium-Albuterol  Take 2 puffs by mouth Every 4 hours as needed.     dextromethorphan-guaiFENesin 30-600 MG per 12 hr tablet  Commonly known as:  MUCINEX DM  Take 3 tablets by mouth daily.     HYDROcodone-acetaminophen 5-500 MG per tablet  Commonly known as:  VICODIN  Take 1 tablet by mouth every 6 (six) hours as needed.     HYDROcodone-acetaminophen 5-325 MG per tablet  Commonly known as:  NORCO/VICODIN  Take 1-2 tablets by mouth every 6 (six) hours as needed for pain.     metFORMIN 500 MG tablet  Commonly known as:  GLUCOPHAGE  Take 1,000 mg by mouth 2 (two) times daily with a meal.     multivitamin tablet  Take 1 tablet by mouth daily.         Significant Diagnostic Studies:  Dg Chest 2 View  05/14/2013   CLINICAL DATA:  Preoperative evaluation for bladder surgery  EXAM: CHEST  2 VIEW  COMPARISON:  05/24/2012  FINDINGS: The cardiac shadow is stable. The lungs are clear bilaterally. Mild degenerative changes of the thoracic spine are seen.  IMPRESSION: No acute abnormality noted.   Electronically Signed   By: Alcide Clever M.D.   On: 05/14/2013 08:45      Hospital Course:  Active Problems:   Bladder cancer   Phimosis  patient underwent a circumcision with meatal dilation and TURBT of bladder tumor. The  patient was kept overnight. He had uneventful night with no complications. Urine remained a light to medium pink color. We'll be discharged with his Foley catheter for an additional 24-48 hours.  Day of Discharge BP 133/68  Pulse 96  Temp(Src) 98.6 F (37 C) (Oral)  Resp 20  Ht 6\' 1"  (1.854 m)  Wt 95.255 kg (210 lb)  BMI 27.71 kg/m2  SpO2 94%  Multiple well-nourished male in no acute distress. Respiratory effort is normal. Abdomen soft and nontender. Genitourinary exam reveals some moderate penile edema status post circumcision. Foley catheter indwelling. Urine medium pink color. Extremities without tenderness or edema. Condition on discharge good.  Results for orders placed during the hospital encounter of 05/14/13 (from the past 24 hour(s))  POCT I-STAT 4, (NA,K, GLUC, HGB,HCT)     Status: Abnormal   Collection Time    05/14/13  9:55 AM      Result Value Range   Sodium 143  135 - 145 mEq/L   Potassium 4.2  3.5 - 5.1 mEq/L   Glucose, Bld 112 (*) 70 - 99 mg/dL   HCT 40.9 (*) 81.1 - 91.4 %   Hemoglobin 24.5 (*) 13.0 - 17.0 g/dL   Comment NOTIFIED PHYSICIAN    POCT I-STAT 4, (NA,K,  GLUC, HGB,HCT)     Status: Abnormal   Collection Time    05/14/13 10:08 AM      Result Value Range   Sodium 142  135 - 145 mEq/L   Potassium 4.5  3.5 - 5.1 mEq/L   Glucose, Bld 132 (*) 70 - 99 mg/dL   HCT 16.1  09.6 - 04.5 %   Hemoglobin 14.3  13.0 - 17.0 g/dL  GLUCOSE, CAPILLARY     Status: Abnormal   Collection Time    05/14/13 11:37 AM      Result Value Range   Glucose-Capillary 104 (*) 70 - 99 mg/dL  GLUCOSE, CAPILLARY     Status: Abnormal   Collection Time    05/14/13  4:34 PM      Result Value Range   Glucose-Capillary 113 (*) 70 - 99 mg/dL   Comment 1 Documented in Chart     Comment 2 Notify RN    GLUCOSE, CAPILLARY     Status: Abnormal   Collection Time    05/14/13  9:22 PM      Result Value Range   Glucose-Capillary 112 (*) 70 - 99 mg/dL   Comment 1 Documented in Chart      Comment 2 Notify RN    HEMOGLOBIN AND HEMATOCRIT, BLOOD     Status: Abnormal   Collection Time    05/15/13  5:38 AM      Result Value Range   Hemoglobin 12.0 (*) 13.0 - 17.0 g/dL   HCT 40.9 (*) 81.1 - 91.4 %

## 2013-05-16 LAB — POCT I-STAT 4, (NA,K, GLUC, HGB,HCT)
Glucose, Bld: 112 mg/dL — ABNORMAL HIGH (ref 70–99)
HCT: 72 % — ABNORMAL HIGH (ref 39.0–52.0)
Hemoglobin: 24.5 g/dL (ref 13.0–17.0)
Sodium: 143 mEq/L (ref 135–145)

## 2013-05-18 NOTE — Anesthesia Postprocedure Evaluation (Signed)
  Anesthesia Post-op Note  Patient: Patrick Lane  Procedure(s) Performed: Procedure(s) (LRB): CYSTOSCOPY WITH MEATAL DILATION  (N/A) CIRCUMCISION ADULT (N/A) TRANSURETHRAL RESECTION OF BLADDER TUMOR (TURBT) (N/A)  Patient Location: PACU  Anesthesia Type: General  Level of Consciousness: awake and alert   Airway and Oxygen Therapy: Patient Spontanous Breathing  Post-op Pain: mild  Post-op Assessment: Post-op Vital signs reviewed, Patient's Cardiovascular Status Stable, Respiratory Function Stable, Patent Airway and No signs of Nausea or vomiting  Last Vitals:  Filed Vitals:   05/15/13 0817  BP: 148/72  Pulse: 112  Temp: 37.1 C  Resp: 18    Post-op Vital Signs: stable   Complications: No apparent anesthesia complications

## 2013-06-01 ENCOUNTER — Encounter: Payer: Self-pay | Admitting: Radiation Oncology

## 2013-06-01 NOTE — Progress Notes (Signed)
GU Location of Tumor / Histology: high-grade muscle invasive bladder cancer  Patient presented originally with erectile dysfunction.   Biopsies of resected left lateral wall of his bladder (if applicable) revealed: high grade muscle invasive bladder cancer  Past/Anticipated interventions by urology, if any: TURBT with concomitant circumcision and meatal dilation with resection of left lateral wall bladder tumor  Past/Anticipated interventions by medical oncology, if any: plus or minus some radiosensitizing chemotherapy  Weight changes, if any: None noted  Bowel/Bladder complaints, if any: urinary frequency, feelings of urinary urgency, nocturia, incontinence, pelvic pain, suprapubic pain, penile and scrotal pain but, no dysuria and urinary stream does not start and stop.   Nausea/Vomiting, if any: None noted  Pain issues, if any:  Joint pain  SAFETY ISSUES:  Prior radiation? NO  Pacemaker/ICD? NO  Possible current pregnancy? NO  Is the patient on methotrexate? NO  Current Complaints / other details:  79 year old male.

## 2013-06-06 ENCOUNTER — Ambulatory Visit
Admission: RE | Admit: 2013-06-06 | Discharge: 2013-06-06 | Disposition: A | Discharge status: Home / Self Care | Payer: Medicare Other | Source: Ambulatory Visit | Attending: Radiation Oncology | Admitting: Radiation Oncology

## 2013-06-06 ENCOUNTER — Telehealth: Payer: Self-pay | Admitting: Oncology

## 2013-06-06 ENCOUNTER — Encounter: Payer: Self-pay | Admitting: Radiation Oncology

## 2013-06-06 VITALS — BP 121/55 | HR 115 | Temp 97.8°F | Resp 20 | Ht 73.0 in | Wt 208.2 lb

## 2013-06-06 DIAGNOSIS — C679 Malignant neoplasm of bladder, unspecified: Secondary | ICD-10-CM | POA: Insufficient documentation

## 2013-06-06 DIAGNOSIS — M810 Age-related osteoporosis without current pathological fracture: Secondary | ICD-10-CM | POA: Insufficient documentation

## 2013-06-06 DIAGNOSIS — E119 Type 2 diabetes mellitus without complications: Secondary | ICD-10-CM | POA: Insufficient documentation

## 2013-06-06 DIAGNOSIS — I2510 Atherosclerotic heart disease of native coronary artery without angina pectoris: Secondary | ICD-10-CM | POA: Insufficient documentation

## 2013-06-06 DIAGNOSIS — J4489 Other specified chronic obstructive pulmonary disease: Secondary | ICD-10-CM | POA: Insufficient documentation

## 2013-06-06 DIAGNOSIS — J449 Chronic obstructive pulmonary disease, unspecified: Secondary | ICD-10-CM | POA: Insufficient documentation

## 2013-06-06 DIAGNOSIS — I714 Abdominal aortic aneurysm, without rupture, unspecified: Secondary | ICD-10-CM | POA: Insufficient documentation

## 2013-06-06 DIAGNOSIS — I700 Atherosclerosis of aorta: Secondary | ICD-10-CM | POA: Insufficient documentation

## 2013-06-06 DIAGNOSIS — Z87891 Personal history of nicotine dependence: Secondary | ICD-10-CM | POA: Insufficient documentation

## 2013-06-06 NOTE — Progress Notes (Signed)
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 male who was referred to Dr. Isabel Caprice for evaluation regarding erectile dysfunction.  On presentation, the patient also described dysuria and inability to retract foreskin.  Also on routine urinalysis, he was noted to have 21-50 red blood cells per high power field reflecting significant microhematuria.  On exam, he was noted to have significant phimosis.  Consequently, he underwent renal ultrasound and in-office cystoscopy on 04/27/13.  Ultrasound was negative, but, on cystoscopy, Dr. Isabel Caprice described a 5 cm nodular tumor in the left side of the bladder.  On 05/14/13, the patient proceeded to the OR for circumcision to facilitate cystoscopic transurethral resection of bladder tumor.  Intraoperatively, the patient was noted to have a left lateral wall the bladder erythematous and thickened mass consistent with an infiltrative invasive transitional cell carcinoma.  The area of involvement was approximately 7 x 4 x 5 cm. The majority of this area was resected but Dr. Isabel Caprice did not feel that he would be able to safely and completely resect all abnormality. He debulked the majority of the tumor and obtain adequate specimen for pathology.  Pathology showed: -  INVASIVE HIGH GRADE UROTHELIAL CELL CARCINOMA. - TUMOR INVADES INTO MUSCULARIS PROPRIA. Staging CT of the abdomen and pelvis at [facility] showed thickening of the left bladder, but no lymphadenopathy or distant disease.  The patient is not felt to be a candidate for radical cystectomy and has kindly been referred today for  consideration of radiation therapy.  PREVIOUS RADIATION THERAPY: No  PAST MEDICAL HISTORY:  has a past medical history of COPD (chronic obstructive pulmonary disease); Hyperlipidemia; Type 2 diabetes mellitus; Adult bronchiectasis; Bladder cancer; Meatal stenosis; History of atrial fibrillation without current medication; AAA (abdominal aortic aneurysm); Productive cough; Dyspnea on exertion; GERD (gastroesophageal reflux disease); History of gastric ulcer; Foot ulcer, left; Arthritis; Bursitis of shoulder; Frequency of urination; Urgency of urination; Nocturia; and Wears glasses.    PAST SURGICAL HISTORY: Past Surgical History  Procedure Laterality Date  . Total knee arthroplasty Right 12-05-2002  . Excision right saphenous neuroma/ right knee arthroscopic lysis adhesions and manipulation  05-29-2003  . Shoulder arthroscopy with rotator cuff repair and subacromial decompression Left 03-02-2005    RESECTION OPEN DISTAL CLAVICLE   . Orif compound right femur fx  1970'S    AND RIGHT PATELLECTOMY /  HARDWARE REMOVED LATER  . Transthoracic echocardiogram  12-07-2002    NORMAL LV/ EF 55-65%  . Appendectomy  AGE 18  . Cataract extraction w/ intraocular lens  implant, bilateral    . Retinal detachment surgery Right 1980'S  . Cystoscopy with urethral dilatation N/A 05/14/2013    Procedure: CYSTOSCOPY WITH MEATAL DILATION ;  Surgeon: Valetta Fuller, MD;  [facility]  Service: Urology;  Laterality: N/A;  . Circumcision N/A 05/14/2013    Procedure: CIRCUMCISION ADULT;  Surgeon: Valetta Fuller, MD;  Location: [facility];  Service: Urology;  Laterality: N/A;  . Transurethral resection of bladder tumor N/A 05/14/2013    Procedure: TRANSURETHRAL RESECTION OF BLADDER TUMOR (TURBT);  Surgeon: Valetta Fuller, MD;  Location: [facility];  Service: Urology;  Laterality: N/A;    FAMILY HISTORY: family history includes Cancer in his mother; Heart disease  in his mother.  SOCIAL HISTORY:  reports that he quit smoking about 13 years ago. His smoking use included Cigarettes. He has a 105 pack-year smoking history. He has quit using smokeless tobacco. His smokeless tobacco use included Chew. He reports that he does not drink alcohol or use illicit drugs.  ALLERGIES: Celebrex  MEDICATIONS:  Current Outpatient Prescriptions  Medication Sig Dispense Refill  . AVODART 0.5 MG capsule Take 1 tablet by mouth Daily.      . COMBIVENT RESPIMAT 20-100 MCG/ACT AERS Take 2 puffs by mouth Every 4 hours as needed.      Marland Kitchen dextromethorphan-guaiFENesin (MUCINEX DM) 30-600 MG per 12 hr tablet Take 3 tablets by mouth daily.       Marland Kitchen HYDROcodone-acetaminophen (NORCO/VICODIN) 5-325 MG per tablet Take 1-2 tablets by mouth every 6 (six) hours as needed for pain.  20 tablet  0  . metFORMIN (GLUCOPHAGE) 500 MG tablet Take 1,000 mg by mouth 2 (two) times daily with a meal.      . Multiple Vitamin (MULTIVITAMIN) tablet Take 1 tablet by mouth daily.       No current facility-administered medications for this encounter.    REVIEW OF SYSTEMS:  A 15 point review of systems is documented in the electronic medical record. This was obtained by the nursing staff. However, I reviewed this with the patient to discuss relevant findings and make appropriate changes.  Pertinent items are noted in HPI.   PHYSICAL EXAM:  height is 6\' 1"  (1.854 m) and weight is 208 lb 3.2 oz (94.439 kg). His temperature is 97.8 F (36.6 C). His blood pressure is 121/55 and his pulse is 115. His respiration is 20 and oxygen saturation is 97%.   Patient was in no acute distress today. Is alert and oriented. According to Dr. Isabel Caprice, well-nourished male in no acute distress Respiratory: Normal effort Cardiac: Regular rate and rhythm Abdomen: Soft nontender no palpable masses  Genitalia: Normal external genitalia Extremities: No tenderness or edema  KPS = 90  LABORATORY DATA:  Lab Results  Component Value  Date   WBC 9.8 10/10/2009   HGB 12.0* 05/15/2013   HCT 37.3* 05/15/2013   MCV 95.4 10/10/2009   PLT 214 10/10/2009   Lab Results  Component Value Date   NA 136 05/15/2013   K 4.5 05/15/2013   CL 100 05/15/2013   CO2 29 05/15/2013   Lab Results  Component Value Date   ALT 31 10/10/2009   AST 36 10/10/2009   ALKPHOS 61 10/10/2009   BILITOT 0.7 10/10/2009     RADIOGRAPHY:  Diagnostic report text  CLINICAL DATA: Bladder cancer.  EXAM: CT ABDOMEN AND PELVIS WITH CONTRAST  TECHNIQUE: Multidetector CT imaging of the abdomen and pelvis was performed using the standard protocol following bolus administration of intravenous contrast.  CONTRAST: 100 cc Isovue-300  COMPARISON: None.  FINDINGS: The lung bases demonstrate bibasilar scarring changes. No pleural effusion or worrisome pulmonary nodule. The heart is normal in size. Dense coronary artery calcifications are noted. There is also advanced atherosclerotic calcification involving the distal descending thoracic aorta. The distal esophagus is grossly normal.  The liver is unremarkable. No focal hepatic lesions or intrahepatic ductal dilatation. The gallbladder is normal. No common bile duct dilatation. The pancreas is normal. Small celiac axis and periportal lymph nodes are noted. The spleen is normal in size.  No focal lesions. The adrenal glands and kidneys are normal. No renal mass, renal calculi or hydronephrosis. Both ureters are normal. No collecting system abnormalities are demonstrated. There is a small left-sided bladder mass and asymmetric wall thickening in the upper aspect of the bladder. This measures a maximum of 2.7 cm. No pelvic lymphadenopathy. The prostate gland and seminal vesicles are unremarkable. No inguinal mass or adenopathy.  The stomach, duodenum, small bowel and colon are unremarkable. No inflammatory changes, mass lesions or obstructive findings. Moderate diverticulosis and wall thickening  involving the sigmoid colon.  An infrarenal abdominal aortic aneurysm is noted. Maximum measurements are 4.2 x 3.5 cm. No dissection. This ends above the iliac artery bifurcation. Advanced atherosclerotic changes involving the aorta and branch vessels. No mesenteric or retroperitoneal mass or adenopathy.  The bony structures are intact. No destructive bone lesions. Moderate osteoporosis.  IMPRESSION: 2.7 cm left-sided bladder mass. No pelvic or abdominal lymphadenopathy or metastatic disease. Borderline celiac axis and periportal lymph nodes, likely benign but attention on future scans is suggested.  4.2 x 3.5 cm infrarenal abdominal aortic aneurysm and advanced atherosclerotic changes involving the aorta and branch vessels.   Electronically Signed By: Loralie Champagne M.D. 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  He is not a good candidate for cystectomy.  He would be a good candidate for definitive radiotherapy with or without radiosensitizing chemotherapy.  PLAN:Today, I talked to the patient and family about the findings and work-up thus far.  We discussed the natural history of disease and general treatment, highlighting the role or radiotherapy in the management.  We discussed the available radiation techniques, and focused on the details of logistics and delivery.  We reviewed the anticipated acute and late sequelae associated with radiation in this setting.  The patient was encouraged to ask questions that I answered to the best of my ability.  I filled out a patient counseling form during our discussion including treatment diagrams.  We retained a copy for our records.  The patient would like to proceed with radiation and will be scheduled for CT simulation.  Prior to starting radiation I will: 1. Refer to Dr. Clelia Croft about radiosensitizing chemo 2. Discuss repeat  TURBT for maximal resection with Dr. Isabel Caprice before radiation.  I spent 60 minutes minutes face to face with the patient and more than 50% of that time was spent in counseling and/or coordination of care.    ------------------------------------------------  Artist Pais. Kathrynn Running, M.D.

## 2013-06-06 NOTE — Telephone Encounter (Signed)
S/w pt and gve np appt 12/03 @ 10:30 w/Dr. Clelia Croft Referring Dr. Kathrynn Running  Dx-Opinion regarding radiosensitizing  Calendar mailed.

## 2013-06-06 NOTE — Progress Notes (Signed)
Patrick Lane here today for [place] visit for invasive bladder cancer.  He denies any pain, nor hematuria, but reports nocturia 1-1 1/2 hours.  He does not void this frequently during the day.  He also reports diarrhea at least 4-5 times daily ( large amounts)intermittently for the past 2-3 months. He denies any dizziness upon standing, but has an unsteady gait due to a prior injury of his right leg.  Orthostatic pressures obtained and his systolic change was 155 to 121 and Pulse change was 101 to 115 sitting to standing.   Reviewed change in diet to manage diarrhea - avoidance of high fiber foods, fatty/greasy foods, and caffeine with increase in water and gatorade.  Given information sheet on managing diarrhea.  He is currently using Pepto -Bismal to control diarrhea.  He admits to not having "much of an appetite".  He has family lives near him and check on often.

## 2013-06-07 ENCOUNTER — Telehealth: Payer: Self-pay | Admitting: Oncology

## 2013-06-07 NOTE — Telephone Encounter (Signed)
C/D 06/07/13 for appt. 06/20/13

## 2013-06-15 ENCOUNTER — Other Ambulatory Visit: Payer: Self-pay | Admitting: Oncology

## 2013-06-15 DIAGNOSIS — C679 Malignant neoplasm of bladder, unspecified: Secondary | ICD-10-CM

## 2013-06-20 ENCOUNTER — Telehealth: Payer: Self-pay | Admitting: Oncology

## 2013-06-20 ENCOUNTER — Encounter: Payer: Self-pay | Admitting: Oncology

## 2013-06-20 ENCOUNTER — Ambulatory Visit (HOSPITAL_BASED_OUTPATIENT_CLINIC_OR_DEPARTMENT_OTHER): Payer: Medicare Other | Admitting: Oncology

## 2013-06-20 ENCOUNTER — Other Ambulatory Visit (HOSPITAL_BASED_OUTPATIENT_CLINIC_OR_DEPARTMENT_OTHER): Payer: Medicare Other | Admitting: Lab

## 2013-06-20 ENCOUNTER — Other Ambulatory Visit: Payer: Self-pay | Admitting: Urology

## 2013-06-20 ENCOUNTER — Ambulatory Visit: Payer: Medicare Other

## 2013-06-20 VITALS — BP 129/62 | HR 112 | Temp 97.6°F | Resp 18 | Ht 73.0 in | Wt 205.9 lb

## 2013-06-20 DIAGNOSIS — C679 Malignant neoplasm of bladder, unspecified: Secondary | ICD-10-CM

## 2013-06-20 LAB — CBC WITH DIFFERENTIAL/PLATELET
Basophils Absolute: 0 10*3/uL (ref 0.0–0.1)
EOS%: 7 % (ref 0.0–7.0)
HCT: 43.5 % (ref 38.4–49.9)
HGB: 14.1 g/dL (ref 13.0–17.1)
MCH: 31.3 pg (ref 27.2–33.4)
MCV: 96.2 fL (ref 79.3–98.0)
NEUT%: 58 % (ref 39.0–75.0)
WBC: 8.9 10*3/uL (ref 4.0–10.3)
lymph#: 2.5 10*3/uL (ref 0.9–3.3)

## 2013-06-20 LAB — COMPREHENSIVE METABOLIC PANEL (CC13)
AST: 24 U/L (ref 5–34)
Anion Gap: 13 mEq/L — ABNORMAL HIGH (ref 3–11)
BUN: 12.6 mg/dL (ref 7.0–26.0)
CO2: 25 mEq/L (ref 22–29)
Calcium: 10.1 mg/dL (ref 8.4–10.4)
Chloride: 103 mEq/L (ref 98–109)
Creatinine: 1 mg/dL (ref 0.7–1.3)
Glucose: 127 mg/dl (ref 70–140)
Potassium: 4.5 mEq/L (ref 3.5–5.1)
Sodium: 141 mEq/L (ref 136–145)
Total Protein: 7.7 g/dL (ref 6.4–8.3)

## 2013-06-20 MED ORDER — CAPECITABINE 500 MG PO TABS: ORAL_TABLET | ORAL | Status: DC

## 2013-06-20 NOTE — Consult Note (Signed)
Reason for Referral: Bladder cancer.   HPI: This is a pleasant 79 year old gentleman currently of [place] referred to me for the evaluation of bladder cancer. He was initially complaining ofdysuria and inability to retract foreskin. He was referred to Dr. Isabel Lane for evaluation regarding erectile dysfunction. on routine urinalysis, he was noted to have 21-50 red blood cells per high power field reflecting significant microhematuria. On exam, he was noted to have significant phimosis. Consequently, he underwent renal ultrasound and in-office cystoscopy on 04/27/13. Ultrasound was negative, but, on cystoscopy, Dr. Isabel Lane described a 5 cm nodular tumor in the left side of the bladder. On 05/14/13, the patient proceeded to the OR for circumcision to facilitate cystoscopic transurethral resection of bladder tumor. Intraoperatively, the patient was noted to have a left lateral wall the bladder erythematous and thickened mass consistent with an infiltrative invasive transitional cell carcinoma. The area of involvement was approximately 7 x 4 x 5 cm. The majority of this area was resected but Dr. Isabel Lane did not feel that he would be able to safely and completely resect all abnormality. He debulked the majority of the tumor and obtain adequate specimen for pathology.   The Pathology showed:  - INVASIVE HIGH GRADE UROTHELIAL CELL CARCINOMA.  - TUMOR INVADES INTO MUSCULARIS PROPRIA.   Staging CT of the abdomen and pelvis at [facility] showed thickening of the left bladder, but no lymphadenopathy or distant disease.   He was not a candidate for radical cystectomy and was evaluated for combined modality treatment. He was evaluated by Dr. Kathrynn Lane on 06/06/2013 for radiation therapy and he is here to discuss the role of chemotherapy with radiation therapy.  Clinically, he is reporting little symptoms at this point. He does not report any abdominal pain or pelvic pain or hematuria. He does report loose stools  at times as well as diffuse arthralgias. He was then put any headaches blurred vision double vision. Did not report any seizure activity alteration of mental status her psychiatric issues. Does not report any fevers or chills or sweats or weight loss. No reports of  any chest pain shortness of breath cough hemoptysis or hematemesis. He does not report any nausea or vomiting abdominal pain but reports some occasional loose stools. Does not report any frequency urgency or hesitancy. Does report erectile dysfunction but no reports of arthralgias or bleeding. Does not report any thrombosis or heat or cold intolerance.    Past Medical History  Diagnosis Date  . COPD (chronic obstructive pulmonary disease)   . Hyperlipidemia   . Type 2 diabetes mellitus   . Adult bronchiectasis   . Bladder cancer   . Meatal stenosis   . History of atrial fibrillation without current medication     POST OP EPISODE 2004   . AAA (abdominal aortic aneurysm)     LAST DUPLEX ULTRASOUND 06-22-2010  3.7x3.6  . Productive cough   . Dyspnea on exertion   . GERD (gastroesophageal reflux disease)   . History of gastric ulcer   . Foot ulcer, left     BOTTOM OF LITTLE TOE , MONITORED BY PCP  DSG DAILY W/ ANTIBIOTIC OINTMENT AND EPSOM SALT SOAKS  . Arthritis   . Bursitis of shoulder     BOTH  . Frequency of urination   . Urgency of urination   . Nocturia   . Wears glasses   :  Past Surgical History  Procedure Laterality Date  . Total knee arthroplasty Right 12-05-2002  . Excision right saphenous neuroma/  right knee arthroscopic lysis adhesions and manipulation  05-29-2003  . Shoulder arthroscopy with rotator cuff repair and subacromial decompression Left 03-02-2005    RESECTION OPEN DISTAL CLAVICLE   . Orif compound right femur fx  1970'S    AND RIGHT PATELLECTOMY /  HARDWARE REMOVED LATER  . Transthoracic echocardiogram  12-07-2002    NORMAL LV/ EF 55-65%  . Appendectomy  AGE 27  . Cataract extraction w/  intraocular lens  implant, bilateral    . Retinal detachment surgery Right 1980'S  . Cystoscopy with urethral dilatation N/A 05/14/2013    Procedure: CYSTOSCOPY WITH MEATAL DILATION ;  Surgeon: Patrick Fuller, MD;  Location: [facility];  Service: Urology;  Laterality: N/A;  . Circumcision N/A 05/14/2013    Procedure: CIRCUMCISION ADULT;  Surgeon: Patrick Fuller, MD;  Location: [facility];  Service: Urology;  Laterality: N/A;  . Transurethral resection of bladder tumor N/A 05/14/2013    Procedure: TRANSURETHRAL RESECTION OF BLADDER TUMOR (TURBT);  Surgeon: Patrick Fuller, MD;  Location: [facility];  Service: Urology;  Laterality: N/A;  :  Current Outpatient Prescriptions  Medication Sig Dispense Refill  . AVODART 0.5 MG capsule Take 1 tablet by mouth Daily.      . COMBIVENT RESPIMAT 20-100 MCG/ACT AERS Take 2 puffs by mouth Every 4 hours as needed.      Marland Kitchen dextromethorphan-guaiFENesin (MUCINEX DM) 30-600 MG per 12 hr tablet Take 3 tablets by mouth daily.       Marland Kitchen HYDROcodone-acetaminophen (NORCO/VICODIN) 5-325 MG per tablet Take 1-2 tablets by mouth every 6 (six) hours as needed for pain.  20 tablet  0  . metFORMIN (GLUCOPHAGE) 500 MG tablet Take 1,000 mg by mouth 2 (two) times daily with a meal.      . Multiple Vitamin (MULTIVITAMIN) tablet Take 1 tablet by mouth daily.      Marland Kitchen     0   No current facility-administered medications for this visit.       Allergies  Allergen Reactions  . Celebrex [Celecoxib] Hives  :  Family History  Problem Relation Age of Onset  . Heart disease Mother   . Cancer Mother     and 3 sisters and 2 brothers but unsure of type.  :  History   Social History  . Marital Status: Widowed    Spouse Name: N/A    Number of Children: N/A  . Years of Education: N/A   Occupational History  . retired    Social History Main Topics  . Smoking status: Former Smoker -- 1.50 packs/day for 70 years    Types:  Cigarettes    Quit date: 07/20/1999  . Smokeless tobacco: Former Neurosurgeon    Types: Chew  . Alcohol Use: No  . Drug Use: No  . Sexual Activity: Not on file   Other Topics Concern  . Not on file   Social History Narrative  . No narrative on file  :  Pertinent items are noted in HPI.  Exam: Blood pressure 129/62, pulse 112, temperature 97.6 F (36.4 C), temperature source Oral, resp. rate 18, height 6\' 1"  (1.854 m), weight 205 lb 14.4 oz (93.396 kg). General appearance: alert, cooperative and appears stated age Head: Normocephalic, without obvious abnormality, atraumatic Eyes: conjunctivae/corneas clear. PERRL, EOM's intact. Fundi benign. Nose: Nares normal. Septum midline. Mucosa normal. No drainage or sinus tenderness. Throat: lips, mucosa, and tongue normal; teeth and gums normal Neck: no adenopathy, no carotid bruit, no  JVD, supple, symmetrical, trachea midline and thyroid not enlarged, symmetric, no tenderness/mass/nodules Resp: clear to auscultation bilaterally Chest wall: no tenderness Cardio: regular rate and rhythm, S1, S2 normal, no murmur, click, rub or gallop GI: soft, non-tender; bowel sounds normal; no masses,  no organomegaly Extremities: extremities normal, atraumatic, no cyanosis or edema Pulses: 2+ and symmetric Skin: Skin color, texture, turgor normal. No rashes or lesions Lymph nodes: Cervical, supraclavicular, and axillary nodes normal.   Recent Labs  06/20/13 1046  WBC 8.9  HGB 14.1  HCT 43.5  PLT 282        Assessment and Plan:   This is an 79 year old gentleman with the following issues:  1. 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 He does not have any evidence to suggest metastatic disease and multiple comorbid conditions to make him not a candidate for a radical cystectomy. I do agree with Dr. Kathrynn Lane and his assessment of the role of radiation therapy and  chemotherapy as a radiosensitizer. Options of treatments were discussed today with Patrick Lane these would include using 5-FU or platinum-based agents. I discussed with him the role of Xeloda as a possible oral 5-FU agents versus using single agent IV carboplatin. Risks and benefits of these approaches were discussed. Complications associated were both were discussed and he is favoring using Xeloda. Risks and benefits of this drug as well as complications that includes nausea, vomiting, hand foot syndrome, diarrhea and myelosuppression as were discussed and he is willing to proceed with this medicine if his insurance covers it. We are planning to give him a total of 1000 mg twice a day Monday to Friday with radiation therapy only. If we are and successful of obtaining this drug for him, he is willing to proceed with carboplatin IV. All his questions were answered today and I will also refer him for chemotherapy education class.  2. Possible residual tumor in the bladder: I do agree with another debulking surgery which is planned in the near future.

## 2013-06-20 NOTE — Progress Notes (Signed)
Please see consult note.  

## 2013-06-20 NOTE — Progress Notes (Signed)
Per dr Clelia Croft, Script for xeloda given to Axel Filler in managed care for financial assistance.

## 2013-06-20 NOTE — Progress Notes (Signed)
Checked in new pt with no financial concerns. °

## 2013-06-20 NOTE — Telephone Encounter (Signed)
appts made per 12/3 POF AVS and CAL given shh °

## 2013-06-22 ENCOUNTER — Encounter (HOSPITAL_BASED_OUTPATIENT_CLINIC_OR_DEPARTMENT_OTHER): Payer: Self-pay | Admitting: *Deleted

## 2013-06-22 NOTE — Progress Notes (Signed)
NPO AFTER MN. ARRIVE AT 1100. NEEDS ISTAT.  CURRENT EKG AND CXR IN EPIC AND CHART. WILL DO MAGNESIUM CITRATE AT NOON DAY BEFORE DOS AND ONE FLEET ENEMA HS BEFORE DOS.

## 2013-06-25 ENCOUNTER — Ambulatory Visit (HOSPITAL_BASED_OUTPATIENT_CLINIC_OR_DEPARTMENT_OTHER)
Admission: RE | Admit: 2013-06-25 | Discharge: 2013-06-25 | Disposition: A | Discharge status: Home / Self Care | Payer: Medicare Other | Source: Ambulatory Visit | Attending: Urology | Admitting: Urology

## 2013-06-25 ENCOUNTER — Encounter (HOSPITAL_BASED_OUTPATIENT_CLINIC_OR_DEPARTMENT_OTHER)
Admission: RE | Disposition: A | Discharge status: Home / Self Care | Payer: Self-pay | Source: Ambulatory Visit | Attending: Urology

## 2013-06-25 ENCOUNTER — Encounter (HOSPITAL_BASED_OUTPATIENT_CLINIC_OR_DEPARTMENT_OTHER): Payer: Medicare Other | Admitting: Anesthesiology

## 2013-06-25 ENCOUNTER — Encounter (HOSPITAL_BASED_OUTPATIENT_CLINIC_OR_DEPARTMENT_OTHER): Payer: Self-pay | Admitting: *Deleted

## 2013-06-25 ENCOUNTER — Ambulatory Visit (HOSPITAL_BASED_OUTPATIENT_CLINIC_OR_DEPARTMENT_OTHER): Payer: Medicare Other | Admitting: Anesthesiology

## 2013-06-25 ENCOUNTER — Other Ambulatory Visit: Payer: Medicare Other

## 2013-06-25 DIAGNOSIS — N35919 Unspecified urethral stricture, male, unspecified site: Secondary | ICD-10-CM | POA: Insufficient documentation

## 2013-06-25 DIAGNOSIS — Z79899 Other long term (current) drug therapy: Secondary | ICD-10-CM | POA: Diagnosis not present

## 2013-06-25 DIAGNOSIS — F17200 Nicotine dependence, unspecified, uncomplicated: Secondary | ICD-10-CM | POA: Diagnosis not present

## 2013-06-25 DIAGNOSIS — E7800 Pure hypercholesterolemia, unspecified: Secondary | ICD-10-CM | POA: Diagnosis not present

## 2013-06-25 DIAGNOSIS — N529 Male erectile dysfunction, unspecified: Secondary | ICD-10-CM | POA: Insufficient documentation

## 2013-06-25 DIAGNOSIS — C672 Malignant neoplasm of lateral wall of bladder: Secondary | ICD-10-CM | POA: Insufficient documentation

## 2013-06-25 DIAGNOSIS — N138 Other obstructive and reflux uropathy: Secondary | ICD-10-CM | POA: Insufficient documentation

## 2013-06-25 DIAGNOSIS — N401 Enlarged prostate with lower urinary tract symptoms: Secondary | ICD-10-CM | POA: Diagnosis not present

## 2013-06-25 HISTORY — PX: TRANSURETHRAL RESECTION OF BLADDER TUMOR: SHX2575

## 2013-06-25 LAB — GLUCOSE, CAPILLARY: Glucose-Capillary: 108 mg/dL — ABNORMAL HIGH (ref 70–99)

## 2013-06-25 LAB — POCT I-STAT 4, (NA,K, GLUC, HGB,HCT)
Glucose, Bld: 135 mg/dL — ABNORMAL HIGH (ref 70–99)
HCT: 43 % (ref 39.0–52.0)
Hemoglobin: 14.6 g/dL (ref 13.0–17.0)
Potassium: 4.2 mEq/L (ref 3.5–5.1)

## 2013-06-25 SURGERY — TURBT (TRANSURETHRAL RESECTION OF BLADDER TUMOR)
Anesthesia: General | Site: Bladder

## 2013-06-25 MED ORDER — LIDOCAINE HCL (CARDIAC) 20 MG/ML IV SOLN
INTRAVENOUS | Status: DC | PRN
  Administered 2013-06-25: 60 mg via INTRAVENOUS

## 2013-06-25 MED ORDER — ONDANSETRON HCL 4 MG/2ML IJ SOLN
INTRAMUSCULAR | Status: DC | PRN
  Administered 2013-06-25: 4 mg via INTRAVENOUS

## 2013-06-25 MED ORDER — SODIUM CHLORIDE 0.9 % IR SOLN
Status: DC | PRN
  Administered 2013-06-25: 12000 mL via INTRAVESICAL

## 2013-06-25 MED ORDER — BELLADONNA ALKALOIDS-OPIUM 16.2-60 MG RE SUPP
RECTAL | Status: DC | PRN
  Administered 2013-06-25: 1 via RECTAL

## 2013-06-25 MED ORDER — FENTANYL CITRATE 0.05 MG/ML IJ SOLN
INTRAMUSCULAR | Status: DC | PRN
  Administered 2013-06-25: 50 ug via INTRAVENOUS
  Administered 2013-06-25 (×2): 25 ug via INTRAVENOUS

## 2013-06-25 MED ORDER — LACTATED RINGERS IV SOLN
INTRAVENOUS | Status: DC
  Administered 2013-06-25 (×2): via INTRAVENOUS
  Filled 2013-06-25: qty 1000

## 2013-06-25 MED ORDER — DEXAMETHASONE SODIUM PHOSPHATE 4 MG/ML IJ SOLN
INTRAMUSCULAR | Status: DC | PRN
  Administered 2013-06-25: 8 mg via INTRAVENOUS

## 2013-06-25 MED ORDER — MAGNESIUM CITRATE PO SOLN
1.0000 | Freq: Once | ORAL | Status: DC
  Filled 2013-06-25: qty 296

## 2013-06-25 MED ORDER — STERILE WATER FOR IRRIGATION IR SOLN
Status: DC | PRN
  Administered 2013-06-25: 500 mL

## 2013-06-25 MED ORDER — FLEET ENEMA 7-19 GM/118ML RE ENEM
1.0000 | ENEMA | Freq: Once | RECTAL | Status: DC
  Filled 2013-06-25: qty 1

## 2013-06-25 MED ORDER — NEOSTIGMINE METHYLSULFATE 1 MG/ML IJ SOLN
INTRAMUSCULAR | Status: DC | PRN
  Administered 2013-06-25: 3 mg via INTRAVENOUS

## 2013-06-25 MED ORDER — GLYCOPYRROLATE 0.2 MG/ML IJ SOLN
INTRAMUSCULAR | Status: DC | PRN
  Administered 2013-06-25: 0.4 mg via INTRAVENOUS

## 2013-06-25 MED ORDER — BELLADONNA ALKALOIDS-OPIUM 16.2-60 MG RE SUPP
RECTAL | Status: AC
  Filled 2013-06-25: qty 1

## 2013-06-25 MED ORDER — ROCURONIUM BROMIDE 100 MG/10ML IV SOLN
INTRAVENOUS | Status: DC | PRN
  Administered 2013-06-25: 30 mg via INTRAVENOUS

## 2013-06-25 MED ORDER — HYDROCODONE-ACETAMINOPHEN 5-325 MG PO TABS: 1.0000 | ORAL_TABLET | Freq: Four times a day (QID) | ORAL | Status: DC | PRN

## 2013-06-25 MED ORDER — FENTANYL CITRATE 0.05 MG/ML IJ SOLN
INTRAMUSCULAR | Status: AC
  Filled 2013-06-25: qty 4

## 2013-06-25 MED ORDER — OXYBUTYNIN CHLORIDE ER 10 MG PO TB24: 10.0000 mg | ORAL_TABLET | Freq: Every day | ORAL | Status: DC

## 2013-06-25 MED ORDER — LIDOCAINE HCL 2 % EX GEL
CUTANEOUS | Status: DC | PRN
  Administered 2013-06-25: 1

## 2013-06-25 MED ORDER — PROPOFOL 10 MG/ML IV BOLUS
INTRAVENOUS | Status: DC | PRN
  Administered 2013-06-25: 160 mg via INTRAVENOUS

## 2013-06-25 MED ORDER — CIPROFLOXACIN IN D5W 400 MG/200ML IV SOLN
400.0000 mg | INTRAVENOUS | Status: AC
  Administered 2013-06-25: 400 mg via INTRAVENOUS
  Filled 2013-06-25: qty 200

## 2013-06-25 MED ORDER — CIPROFLOXACIN IN D5W 400 MG/200ML IV SOLN
INTRAVENOUS | Status: AC
  Filled 2013-06-25: qty 200

## 2013-06-25 SURGICAL SUPPLY — 33 items
BAG DRAIN URO-CYSTO SKYTR STRL (DRAIN) ×2 IMPLANT
BAG DRN ANRFLXCHMBR STRAP LEK (BAG)
BAG DRN UROCATH (DRAIN) ×1
BAG URINE DRAINAGE (UROLOGICAL SUPPLIES) ×1 IMPLANT
BAG URINE LEG 19OZ MD ST LTX (BAG) IMPLANT
CANISTER SUCT LVC 12 LTR MEDI- (MISCELLANEOUS) ×2 IMPLANT
CATH FOLEY 2WAY SLVR  5CC 20FR (CATHETERS) ×1
CATH FOLEY 2WAY SLVR  5CC 22FR (CATHETERS)
CATH FOLEY 2WAY SLVR 5CC 20FR (CATHETERS) IMPLANT
CATH FOLEY 2WAY SLVR 5CC 22FR (CATHETERS) IMPLANT
CLOTH BEACON ORANGE TIMEOUT ST (SAFETY) ×2 IMPLANT
DRAPE CAMERA CLOSED 9X96 (DRAPES) ×2 IMPLANT
ELECT BUTTON BIOP 24F 90D PLAS (MISCELLANEOUS) IMPLANT
ELECT LOOP HF 26F 30D .35MM (CUTTING LOOP) IMPLANT
ELECT LOOP MED HF 24F 12D CBL (CLIP) ×1 IMPLANT
ELECT NEEDLE 45D HF 24-28F 12D (CUTTING LOOP) IMPLANT
ELECT REM PT RETURN 9FT ADLT (ELECTROSURGICAL) ×2
ELECTRODE REM PT RTRN 9FT ADLT (ELECTROSURGICAL) ×1 IMPLANT
EVACUATOR MICROVAS BLADDER (UROLOGICAL SUPPLIES) ×1 IMPLANT
GLOVE BIO SURGEON STRL SZ 6.5 (GLOVE) ×1 IMPLANT
GLOVE BIO SURGEON STRL SZ7.5 (GLOVE) ×2 IMPLANT
GLOVE INDICATOR 7.0 STRL GRN (GLOVE) ×1 IMPLANT
GOWN STRL NON-REIN LRG LVL3 (GOWN DISPOSABLE) ×1 IMPLANT
GOWN STRL REIN XL XLG (GOWN DISPOSABLE) ×2 IMPLANT
HOLDER FOLEY CATH W/STRAP (MISCELLANEOUS) ×1 IMPLANT
IV NS 1000ML (IV SOLUTION) ×18
IV NS 1000ML BAXH (IV SOLUTION) IMPLANT
IV NS IRRIG 3000ML ARTHROMATIC (IV SOLUTION) ×1 IMPLANT
KIT ASPIRATION TUBING (SET/KITS/TRAYS/PACK) IMPLANT
LOOP CUTTING 24FR OLYMPUS (CUTTING LOOP) IMPLANT
PACK CYSTOSCOPY (CUSTOM PROCEDURE TRAY) ×2 IMPLANT
PLUG CATH AND CAP STER (CATHETERS) IMPLANT
SET ASPIRATION TUBING (TUBING) ×1 IMPLANT

## 2013-06-25 NOTE — H&P (Signed)
History of Present Illness  Patrick Lane presents today for repeat TURBT for further treatment of his muscle invasive transitional cell carcinoma. The patient was originally sent to me for erectile dysfunction. We noted significant hematuria. We went ahead with an evaluation which revealed a large tumor involving the left lateral wall of his bladder. The patient subsequently underwent resection which showed a poorly differentiated muscle invasive bladder cancer. The patient was not felt to be a good candidate for cystectomy given his age and medical core morbidities. He has been seen by radiation as well as medical oncology with plans to go ahead with chemoradiation for his muscle invasive disease. We felt that at the time of his initial resection we had done a significant debulking of the tumor but it would be beneficial to the patient to go in for reresection to try to resect as much tumor as we felt we could safely do to reduce the overall tumor burden and to improve his response to the chemotherapy and radiation. He presents now for the second procedure. His original resection was 4-5 weeks ago. Past Medical History  Problems  1. History of Hypercholesterolemia 272.0  2. Tobacco Use 305.1  Surgical History  Problems  1. History of Knee Surgery  Current Meds  1. Avodart 0.5 MG Oral Capsule; Therapy: 06Aug2013 to  2. MetFORMIN HCl ER 500 MG Oral Tablet Extended Release 24 Hour; Therapy: 23Jul2014 to  3. Mucinex 600 MG TBCR; Therapy: (Recorded:11Sep2014) to  Allergies  Medication  1. CeleBREX CAPS  Family History  Problems  1. Family history of Family Health Status Number Of Children  no children  2. Family history of Father Deceased At Age ____  3. Family history of Mother Deceased At Age ____  Social History  Problems  1. Caffeine Use  2  2. Occupation: Retired  Denied  3. History of Alcohol Use  Review of Systems  Genitourinary, constitutional, skin, eye, otolaryngeal,  hematologic/lymphatic, cardiovascular, pulmonary, endocrine, musculoskeletal, gastrointestinal, neurological and psychiatric system(s) were reviewed and pertinent findings if present are noted.  Genitourinary: urinary frequency, feelings of urinary urgency, nocturia and incontinence, but no dysuria and urinary stream does not start and stop.  Constitutional: feeling tired (fatigue).  Respiratory: shortness of breath and cough.  Musculoskeletal: joint pain.  Results/Data  renal ultrasound: Right kidney 10.6 cm. Left kidney also 10.6 cm. No evidence of hydronephrosis no evidence of solid renal mass stones or significant cysts. Postvoid residual was negligible. Normal renal ultrasound.  Physical exam  Multiple well-nourished male in no acute distress  Respiratory: Normal effort  Cardiac: Regular rate and rhythm  Abdomen: Soft nontender no palpable masses  Genitalia: Normal external genitalia  Extremities: No tenderness or edema   Assessment  Assessed  1. Benign Prostatic Hypertrophy With Urinary Obstruction 600.01  2. Microscopic Hematuria 599.72  3. Male Erectile Disorder Due To Physical Condition 607.84  4. Bladder Cancer 188.9  5. Meatal Stenosis 598.9  Plan  Health Maintenance (V70.0)  1. UA With REFLEX Done: 10Oct2014 09:23AM  Male Erectile Disorder Due To Physical Condition (607.84)  2. Rapaflo 8 MG Oral Capsule; TAKE 1 CAPSULE Daily; Therapy: 10Oct2014 to  (Evaluate:08May2015) Requested for: 10Oct2014; Last Rx:10Oct2014; Edited  3. Follow-up Month x 6 Office Follow-up Requested for: 10Apr2015  Microscopic Hematuria (599.72)  4. Cysto  5. Cysto w/Dilation Done: 10Oct2014  6. Follow-up Schedule Surgery Office Follow-up Done: 10Oct2014  Discussion/Summary  Muscle invasive bladder cancer. Patient now presents for repeat endoscopic assessment and attempted repeat resection to  try to debulk/completely resect as much tumor as possible to further improve our chances of response to  chemoradiation.

## 2013-06-25 NOTE — Anesthesia Procedure Notes (Signed)
Procedure Name: LMA Insertion Date/Time: 06/25/2013 12:40 PM Performed by: Renella Cunas D Pre-anesthesia Checklist: Patient identified, Emergency Drugs available, Suction available and Patient being monitored Patient Re-evaluated:Patient Re-evaluated prior to inductionOxygen Delivery Method: Circle System Utilized Preoxygenation: Pre-oxygenation with 100% oxygen Intubation Type: IV induction Ventilation: Mask ventilation without difficulty LMA: LMA inserted LMA Size: 5.0 Number of attempts: 1 Airway Equipment and Method: bite block Placement Confirmation: positive ETCO2 Tube secured with: Tape Dental Injury: Teeth and Oropharynx as per pre-operative assessment

## 2013-06-25 NOTE — Op Note (Signed)
Preoperative diagnosis: History of muscle invasive transitional cell carcinoma the bladder Postoperative diagnosis: Same  Procedure: TURBT   Surgeon: Valetta Fuller M.D.  Anesthesia: Gen.  Indications: Patrick Lane is 79 years of age. He was noted recently to have significant microscopic hematuria and further assessment revealed an extensive poorly differentiated muscle invasive transitional cell carcinoma involving the majority of the left lateral wall of his bladder. He underwent a resection/debulking of the tumor. Muscle invasive disease was confirmed. CT imaging revealed bladder wall thickening but no evidence of locally extended or metastatic disease. The patient is not felt to be a candidate for cystectomy given his age and medical core morbidities. He has undergone consultation with regard to radiation therapy with chemotherapy. The plan is to further debulk/resect his tumor to minimize residual disease going into alternative therapies.     Technique and findings: Patient was brought the operating room where he had successful induction general anesthesia. He was placed in lithotomy position and prepped and draped in usual manner. Again a 20 [place] continuous flow resectoscope was inserted. There were marked changes on the left lateral wall of his bladder consistent with his previously diagnosed tumor as well as some postoperative changes. His original resection was approximately 4-6 weeks ago. A continuous flow resectoscope was utilized with a resecting loop to remove as much residual tumor that we felt we could safely resect. Hemostasis was obtained primarily with coagulation. There did not appear to be gross evidence of viable tumor. There was a markedly necrotic tissue. There was considerable bladder wall thickening and we did send additional muscle as well as some mucosal edges.  The area of repeat resection measured approximately 6 x 8 cm. The bladder wall tissue was sent for permanent analysis.  A 20 French Foley catheter was inserted and urine was light pain. The patient had no obvious complications or problems and was brought to recovery room stable condition.

## 2013-06-25 NOTE — Anesthesia Preprocedure Evaluation (Signed)
Anesthesia Evaluation  Patient identified by MRN, date of birth, ID band Patient awake    Reviewed: Allergy & Precautions, H&P , NPO status , Patient's Chart, lab work & pertinent test results  Airway Mallampati: II TM Distance: >3 FB Neck ROM: Limited    Dental no notable dental hx.    Pulmonary shortness of breath and with exertion, COPDformer smoker,  breath sounds clear to auscultation  + decreased breath sounds      Cardiovascular + Peripheral Vascular Disease Rhythm:Regular Rate:Tachycardia  AAA (abdominal aortic aneurysm)   LAST DUPLEX ULTRASOUND 06-22-2010  3.7x3.6    Neuro/Psych negative neurological ROS  negative psych ROS   GI/Hepatic Neg liver ROS, GERD-  ,  Endo/Other  diabetes, Oral Hypoglycemic Agents  Renal/GU negative Renal ROS  negative genitourinary   Musculoskeletal negative musculoskeletal ROS (+)   Abdominal   Peds negative pediatric ROS (+)  Hematology negative hematology ROS (+)   Anesthesia Other Findings   Reproductive/Obstetrics negative OB ROS                           Anesthesia Physical  Anesthesia Plan  ASA: III  Anesthesia Plan: General   Post-op Pain Management:    Induction: Intravenous  Airway Management Planned: LMA  Additional Equipment:   Intra-op Plan:   Post-operative Plan:   Informed Consent: I have reviewed the patients History and Physical, chart, labs and discussed the procedure including the risks, benefits and alternatives for the proposed anesthesia with the patient or authorized representative who has indicated his/her understanding and acceptance.   Dental advisory given  Plan Discussed with: CRNA and Surgeon  Anesthesia Plan Comments:         Anesthesia Quick Evaluation

## 2013-06-25 NOTE — Interval H&P Note (Signed)
History and Physical Interval Note:  06/25/2013 11:52 AM  Patrick Lane  has presented today for surgery, with the diagnosis of Bladder Cancer  The various methods of treatment have been discussed with the patient and family. After consideration of risks, benefits and other options for treatment, the patient has consented to  Procedure(s) with comments: REPEAT TRANSURETHRAL RESECTION OF BLADDER TUMOR (TURBT) (N/A) - POSSIBLE OUTPATIENT WITH OBSERVATION  REPEAT TURBT   as a surgical intervention .  The patient's history has been reviewed, patient examined, no change in status, stable for surgery.  I have reviewed the patient's chart and labs.  Questions were answered to the patient's satisfaction.     Roverto Bodmer S

## 2013-06-25 NOTE — Transfer of Care (Signed)
Immediate Anesthesia Transfer of Care Note  Patient: Patrick Lane  Procedure(s) Performed: Procedure(s) (LRB): REPEAT TRANSURETHRAL RESECTION OF BLADDER TUMOR (TURBT) (N/A)  Patient Location: PACU  Anesthesia Type: General  Level of Consciousness: awake, oriented, sedated and patient cooperative  Airway & Oxygen Therapy: Patient Spontanous Breathing and Patient connected to face mask oxygen  Post-op Assessment: Report given to PACU RN and Post -op Vital signs reviewed and stable  Post vital signs: Reviewed and stable  Complications: No apparent anesthesia complications

## 2013-06-26 ENCOUNTER — Encounter (HOSPITAL_BASED_OUTPATIENT_CLINIC_OR_DEPARTMENT_OTHER): Payer: Self-pay | Admitting: Urology

## 2013-06-26 NOTE — Anesthesia Postprocedure Evaluation (Signed)
Anesthesia Post Note  Patient: Patrick Lane  Procedure(s) Performed: Procedure(s) (LRB): REPEAT TRANSURETHRAL RESECTION OF BLADDER TUMOR (TURBT) (N/A)  Anesthesia type: General  Patient location: PACU  Post pain: Pain level controlled  Post assessment: Post-op Vital signs reviewed  Last Vitals: BP 129/70  Pulse 96  Temp(Src) 36.5 C (Oral)  Resp 16  Wt 200 lb (90.719 kg)  SpO2 96%  Post vital signs: Reviewed  Level of consciousness: sedated  Complications: No apparent anesthesia complications

## 2013-07-13 ENCOUNTER — Ambulatory Visit
Admission: RE | Admit: 2013-07-13 | Discharge: 2013-07-13 | Disposition: A | Discharge status: Home / Self Care | Payer: Medicare Other | Source: Ambulatory Visit | Attending: Radiation Oncology | Admitting: Radiation Oncology

## 2013-07-13 DIAGNOSIS — R197 Diarrhea, unspecified: Secondary | ICD-10-CM | POA: Insufficient documentation

## 2013-07-13 DIAGNOSIS — Z510 Encounter for antineoplastic radiation therapy: Secondary | ICD-10-CM | POA: Insufficient documentation

## 2013-07-13 DIAGNOSIS — C679 Malignant neoplasm of bladder, unspecified: Secondary | ICD-10-CM | POA: Insufficient documentation

## 2013-07-13 DIAGNOSIS — R350 Frequency of micturition: Secondary | ICD-10-CM | POA: Insufficient documentation

## 2013-07-13 NOTE — Progress Notes (Signed)
  Radiation Oncology         [phone]) [phone] ________________________________  Name: Patrick Lane MRN: [id number]  Date: 07/13/2013  DOB: 27-Aug-1927  SIMULATION AND TREATMENT PLANNING NOTE  DIAGNOSIS:  79 year old gentleman with muscle invasive transitional cell carcinoma of the bladder  NARRATIVE:  The patient was brought to the CT Simulation planning suite.  Identity was confirmed.  All relevant records and images related to the planned course of therapy were reviewed.  The patient freely provided informed written consent to proceed with treatment after reviewing the details related to the planned course of therapy. The consent form was witnessed and verified by the simulation staff.  Then, the patient was set-up in a stable reproducible supine position for radiation therapy.  A vacuum lock pillow device was custom fabricated to position his legs in a reproducible immobilized position.  Then, I performed a cystogram under sterile conditions to identify the bladder.  CT images were obtained.  Surface markings were placed.  The CT images were loaded into the planning software.  Then the prostate target and avoidance structures including the rectum, bladder, bowel and hips were contoured.  Treatment planning then occurred.  The radiation prescription was entered and confirmed.  A total of one complex treatment devices were fabricated. I have requested : 3D Simulation  I have requested a DVH of the following structures: bladder, rectum, bowel, and hips.  SPECIAL TREATMENT PROCEDURE:   This treatment constitutes a Special Treatment Procedure for the following reason: He will receive concurrent chemotherapy requiring careful monitoring for increased toxicities of treatment including weekly laboratory values.  The special nature of the planned course of radiotherapy will require increased physician supervision and oversight to ensure patient's safety with optimal treatment outcomes.  PLAN:  The patient  will receive 45 Gy in 25 fractions to the pelvis with a boost to 64.8 Gy to the tumor site.  ________________________________  Artist Pais. Kathrynn Running, M.D.

## 2013-07-18 ENCOUNTER — Telehealth: Payer: Self-pay | Admitting: *Deleted

## 2013-07-18 ENCOUNTER — Ambulatory Visit (HOSPITAL_BASED_OUTPATIENT_CLINIC_OR_DEPARTMENT_OTHER): Payer: Medicare Other | Admitting: Oncology

## 2013-07-18 ENCOUNTER — Other Ambulatory Visit (HOSPITAL_BASED_OUTPATIENT_CLINIC_OR_DEPARTMENT_OTHER): Payer: Medicare Other

## 2013-07-18 VITALS — BP 151/73 | HR 106 | Temp 98.1°F | Resp 17 | Ht 73.0 in | Wt 202.0 lb

## 2013-07-18 DIAGNOSIS — C679 Malignant neoplasm of bladder, unspecified: Secondary | ICD-10-CM

## 2013-07-18 DIAGNOSIS — C50919 Malignant neoplasm of unspecified site of unspecified female breast: Secondary | ICD-10-CM

## 2013-07-18 LAB — CBC WITH DIFFERENTIAL/PLATELET
Basophils Absolute: 0 10*3/uL (ref 0.0–0.1)
EOS%: 6 % (ref 0.0–7.0)
HCT: 42.3 % (ref 38.4–49.9)
HGB: 14 g/dL (ref 13.0–17.1)
MCH: 31.1 pg (ref 27.2–33.4)
MCV: 93.9 fL (ref 79.3–98.0)
MONO%: 7.8 % (ref 0.0–14.0)
NEUT%: 58.8 % (ref 39.0–75.0)
RBC: 4.5 10*6/uL (ref 4.20–5.82)
RDW: 14.5 % (ref 11.0–14.6)

## 2013-07-18 LAB — COMPREHENSIVE METABOLIC PANEL (CC13)
ALT: 13 U/L (ref 0–55)
AST: 18 U/L (ref 5–34)
Albumin: 3.8 g/dL (ref 3.5–5.0)
Alkaline Phosphatase: 70 U/L (ref 40–150)
BUN: 10.4 mg/dL (ref 7.0–26.0)
Calcium: 9.9 mg/dL (ref 8.4–10.4)
Creatinine: 1 mg/dL (ref 0.7–1.3)
Potassium: 4.8 mEq/L (ref 3.5–5.1)
Total Bilirubin: 0.52 mg/dL (ref 0.20–1.20)

## 2013-07-18 MED ORDER — PROCHLORPERAZINE MALEATE 10 MG PO TABS: 10.0000 mg | ORAL_TABLET | Freq: Four times a day (QID) | ORAL | Status: DC | PRN

## 2013-07-18 NOTE — Telephone Encounter (Signed)
Per staff message and POF I have scheduled appts.  JMW  

## 2013-07-18 NOTE — Telephone Encounter (Signed)
appts made and printed. Pt is aware that tx will be added. i emailed MW to add the tx...td 

## 2013-07-18 NOTE — Progress Notes (Signed)
Hematology and Oncology Follow Up Visit  Patrick Lane [id number] 03-31-1928 79 y.o. 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   Prior Therapy: He is status post a repeat TURBT on 06/25/2013 under the care of Dr. Isabel Caprice.   Current therapy: He is under consideration to start combined modality therapy radiation and chemotherapy. Chemotherapy will be given in the form of carboplatin AUC of 2 on a weekly basis with radiation to start on 07/27/2013.  Interim History:  Patrick Lane presents today for a followup visit. He is a pleasant gentleman with the above diagnosis presents today for evaluation before the start of his chemotherapy. He have tolerated the repeat TURBT without any complications. He did have a urinary tract infection currently recovering quite nicely from. Has not reported any other complaints including constitutional symptoms and weakness. He has not reported any nausea, vomiting or any GI complications.  Medications: I have reviewed the patient's current medications.  Current Outpatient Prescriptions  Medication Sig Dispense Refill  . AVODART 0.5 MG capsule Take 1 tablet by mouth Daily.      . capecitabine (XELODA) 500 MG tablet Take two tablets twice a day Monday to Friday with radiation.  128 tablet  0  . COMBIVENT RESPIMAT 20-100 MCG/ACT AERS Take 2 puffs by mouth Every 4 hours as needed.      Marland Kitchen dextromethorphan-guaiFENesin (MUCINEX DM) 30-600 MG per 12 hr tablet Take 3 tablets by mouth daily.       Marland Kitchen HYDROcodone-acetaminophen (NORCO/VICODIN) 5-325 MG per tablet Take 1-2 tablets by mouth every 6 (six) hours as needed for pain.  20 tablet  0  . HYDROcodone-acetaminophen (NORCO/VICODIN) 5-325 MG per tablet Take 1-2 tablets by mouth every 6 (six) hours as needed.  20 tablet  0  . metFORMIN  (GLUCOPHAGE) 500 MG tablet Take 1,000 mg by mouth 2 (two) times daily with a meal.      . Multiple Vitamin (MULTIVITAMIN) tablet Take 1 tablet by mouth daily.      Marland Kitchen oxybutynin (DITROPAN XL) 10 MG 24 hr tablet Take 1 tablet (10 mg total) by mouth daily.  20 tablet  0  . prochlorperazine (COMPAZINE) 10 MG tablet Take 1 tablet (10 mg total) by mouth every 6 (six) hours as needed for nausea or vomiting.  30 tablet  0   No current facility-administered medications for this visit.     Allergies:  Allergies  Allergen Reactions  . Celebrex [Celecoxib] Hives    Past Medical History, Surgical history, Social history, and Family History were reviewed and updated.  Review of Systems: Constitutional:  Negative for fever, chills, night sweats, anorexia, weight loss, pain. Cardiovascular: no chest pain or dyspnea on exertion Respiratory: no cough, shortness of breath, or wheezing Neurological: no TIA or stroke symptoms Dermatological: negative for acne ENT: negative Skin: Negative. Gastrointestinal: negative Genito-Urinary: no dysuria, trouble voiding, or hematuria Hematological and Lymphatic: negative Breast: negative Musculoskeletal: negative Remaining ROS negative. Physical Exam: Blood pressure 151/73, pulse 106, temperature 98.1 F (36.7 C), temperature source Oral, resp. rate 17, height 6\' 1"  (1.854 m), weight 202 lb (91.627 kg), SpO2 92.00%. ECOG: 1 General appearance: alert, cooperative and appears stated age Head: Normocephalic, without obvious abnormality, atraumatic Neck: no adenopathy, no carotid bruit, no JVD, supple, symmetrical, trachea midline and thyroid not enlarged, symmetric, no tenderness/mass/nodules Lymph nodes: Cervical, supraclavicular, and  axillary nodes normal. Heart:regular rate and rhythm, S1, S2 normal, no murmur, click, rub or gallop Lung:chest clear, no wheezing, rales, normal symmetric air entry Abdomin: soft, non-tender, without masses or  organomegaly EXT:no erythema, induration, or nodules   Lab Results: Lab Results  Component Value Date   WBC 9.1 07/18/2013   HGB 14.0 07/18/2013   HCT 42.3 07/18/2013   MCV 93.9 07/18/2013   PLT 266 07/18/2013     Chemistry      Component Value Date/Time   NA 139 06/25/2013 1211   NA 141 06/20/2013 1046   K 4.2 06/25/2013 1211   K 4.5 06/20/2013 1046   CL 100 05/15/2013 0538   CO2 25 06/20/2013 1046   CO2 29 05/15/2013 0538   BUN 12.6 06/20/2013 1046   BUN 11 05/15/2013 0538   CREATININE 1.0 06/20/2013 1046   CREATININE 0.99 05/15/2013 0538      Component Value Date/Time   CALCIUM 10.1 06/20/2013 1046   CALCIUM 9.2 05/15/2013 0538   ALKPHOS 70 06/20/2013 1046   ALKPHOS 61 10/10/2009 1829   AST 24 06/20/2013 1046   AST 36 10/10/2009 1829   ALT 17 06/20/2013 1046   ALT 31 10/10/2009 1829   BILITOT 0.49 06/20/2013 1046   BILITOT 0.7 10/10/2009 1829      Impression and Plan:  This is an 79 year old gentleman with the following issues:  1. 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 He is not a candidate for cystectomy and will receive combined modality with radiation therapy and chemotherapy. We were not able to get Xeloda approved for him, and we'll started on IV carboplatin instead starting on 07/27/2013. Risks and benefits were repeated today and he is agreeable to proceed. I anticipate he will likely need 6 or 7 weeks of treatment to coincide with radiation. I will also refer him to chemotherapy education class prior to start to chemotherapy.  2. Nausea prophylaxis: I have given him a prescription for Compazine.   [facility], MD 12/31/20142:27 PM

## 2013-07-24 ENCOUNTER — Other Ambulatory Visit: Payer: Medicare HMO

## 2013-07-24 ENCOUNTER — Encounter: Payer: Self-pay | Admitting: *Deleted

## 2013-07-25 ENCOUNTER — Ambulatory Visit
Admission: RE | Admit: 2013-07-25 | Discharge: 2013-07-25 | Disposition: A | Discharge status: Home / Self Care | Payer: Medicare Other | Source: Ambulatory Visit | Attending: Radiation Oncology | Admitting: Radiation Oncology

## 2013-07-25 DIAGNOSIS — C679 Malignant neoplasm of bladder, unspecified: Secondary | ICD-10-CM

## 2013-07-26 ENCOUNTER — Ambulatory Visit
Admission: RE | Admit: 2013-07-26 | Discharge: 2013-07-26 | Disposition: A | Discharge status: Home / Self Care | Payer: Medicare Other | Source: Ambulatory Visit | Attending: Radiation Oncology | Admitting: Radiation Oncology

## 2013-07-27 ENCOUNTER — Ambulatory Visit (HOSPITAL_BASED_OUTPATIENT_CLINIC_OR_DEPARTMENT_OTHER): Payer: Commercial Managed Care - HMO

## 2013-07-27 ENCOUNTER — Other Ambulatory Visit: Payer: Self-pay | Admitting: Oncology

## 2013-07-27 ENCOUNTER — Other Ambulatory Visit (HOSPITAL_BASED_OUTPATIENT_CLINIC_OR_DEPARTMENT_OTHER): Payer: Commercial Managed Care - HMO

## 2013-07-27 ENCOUNTER — Ambulatory Visit
Admission: RE | Admit: 2013-07-27 | Discharge: 2013-07-27 | Disposition: A | Discharge status: Home / Self Care | Payer: Medicare Other | Source: Ambulatory Visit | Attending: Radiation Oncology | Admitting: Radiation Oncology

## 2013-07-27 ENCOUNTER — Encounter: Payer: Self-pay | Admitting: Radiation Oncology

## 2013-07-27 VITALS — BP 132/71 | HR 105 | Resp 18 | Wt 205.2 lb

## 2013-07-27 DIAGNOSIS — C50919 Malignant neoplasm of unspecified site of unspecified female breast: Secondary | ICD-10-CM

## 2013-07-27 DIAGNOSIS — C779 Secondary and unspecified malignant neoplasm of lymph node, unspecified: Secondary | ICD-10-CM

## 2013-07-27 DIAGNOSIS — C679 Malignant neoplasm of bladder, unspecified: Secondary | ICD-10-CM

## 2013-07-27 DIAGNOSIS — Z5111 Encounter for antineoplastic chemotherapy: Secondary | ICD-10-CM

## 2013-07-27 LAB — CBC WITH DIFFERENTIAL/PLATELET
BASO%: 0.2 % (ref 0.0–2.0)
Basophils Absolute: 0 10*3/uL (ref 0.0–0.1)
EOS%: 7.2 % — AB (ref 0.0–7.0)
Eosinophils Absolute: 0.6 10*3/uL — ABNORMAL HIGH (ref 0.0–0.5)
HEMATOCRIT: 42.2 % (ref 38.4–49.9)
HGB: 13.7 g/dL (ref 13.0–17.1)
LYMPH#: 2.4 10*3/uL (ref 0.9–3.3)
LYMPH%: 27.9 % (ref 14.0–49.0)
MCH: 30.5 pg (ref 27.2–33.4)
MCHC: 32.5 g/dL (ref 32.0–36.0)
MCV: 94 fL (ref 79.3–98.0)
MONO#: 0.7 10*3/uL (ref 0.1–0.9)
MONO%: 8.5 % (ref 0.0–14.0)
NEUT#: 4.8 10*3/uL (ref 1.5–6.5)
NEUT%: 56.2 % (ref 39.0–75.0)
Platelets: 256 10*3/uL (ref 140–400)
RBC: 4.49 10*6/uL (ref 4.20–5.82)
RDW: 14.4 % (ref 11.0–14.6)
WBC: 8.6 10*3/uL (ref 4.0–10.3)
nRBC: 0 % (ref 0–0)

## 2013-07-27 LAB — COMPREHENSIVE METABOLIC PANEL (CC13)
ALK PHOS: 71 U/L (ref 40–150)
ALT: 19 U/L (ref 0–55)
AST: 23 U/L (ref 5–34)
Albumin: 3.7 g/dL (ref 3.5–5.0)
Anion Gap: 13 mEq/L — ABNORMAL HIGH (ref 3–11)
BUN: 11 mg/dL (ref 7.0–26.0)
CO2: 25 mEq/L (ref 22–29)
Calcium: 10 mg/dL (ref 8.4–10.4)
Chloride: 101 mEq/L (ref 98–109)
Creatinine: 0.9 mg/dL (ref 0.7–1.3)
Glucose: 101 mg/dl (ref 70–140)
POTASSIUM: 4.5 meq/L (ref 3.5–5.1)
Sodium: 139 mEq/L (ref 136–145)
Total Bilirubin: 0.63 mg/dL (ref 0.20–1.20)
Total Protein: 7.4 g/dL (ref 6.4–8.3)

## 2013-07-27 MED ORDER — SODIUM CHLORIDE 0.9 % IV SOLN
Freq: Once | INTRAVENOUS | Status: AC
  Administered 2013-07-27: 16:00:00 via INTRAVENOUS

## 2013-07-27 MED ORDER — ONDANSETRON 8 MG/50ML IVPB (CHCC)
8.0000 mg | Freq: Once | INTRAVENOUS | Status: AC
  Administered 2013-07-27: 8 mg via INTRAVENOUS

## 2013-07-27 MED ORDER — ONDANSETRON 8 MG/NS 50 ML IVPB
INTRAVENOUS | Status: AC
  Filled 2013-07-27: qty 8

## 2013-07-27 MED ORDER — SODIUM CHLORIDE 0.9 % IV SOLN
195.0000 mg | Freq: Once | INTRAVENOUS | Status: AC
  Administered 2013-07-27: 200 mg via INTRAVENOUS
  Filled 2013-07-27: qty 20

## 2013-07-27 MED ORDER — DEXAMETHASONE SODIUM PHOSPHATE 10 MG/ML IJ SOLN
INTRAMUSCULAR | Status: AC
Start: 2013-07-27 — End: 2013-07-27
  Filled 2013-07-27: qty 1

## 2013-07-27 MED ORDER — DEXAMETHASONE SODIUM PHOSPHATE 10 MG/ML IJ SOLN
10.0000 mg | Freq: Once | INTRAMUSCULAR | Status: AC
  Administered 2013-07-27: 10 mg via INTRAVENOUS

## 2013-07-27 NOTE — Progress Notes (Signed)
Patient concerned about numbness and tingling from his knee to his ankles. Patient is a diabetic. Encouraged patient to contact PCP reference this matter. Patient reports that he saw his PCP yesterday but, didn't mention this matter. Patient reports this sensation wake him up at night. Reports that he gets up every hour and a half to void during the night. Denies burning with urination. Reports urgency. Reports he occasionally dribbles urine therefore he wears depends. Patient scheduled for first chemo treatment today.

## 2013-07-27 NOTE — Patient Instructions (Addendum)
[facility] Discharge Instructions for Patients Receiving Chemotherapy  Today you received the following chemotherapy agent: Carboplatin  To help prevent nausea and vomiting after your treatment, we encourage you to take your nausea medication: compazine 10 mg every 6 hours as needed  If you develop nausea and vomiting that is not controlled by your nausea medication, call the clinic.   BELOW ARE SYMPTOMS THAT SHOULD BE REPORTED IMMEDIATELY:  *FEVER GREATER THAN 100.5 F  *CHILLS WITH OR WITHOUT FEVER  NAUSEA AND VOMITING THAT IS NOT CONTROLLED WITH YOUR NAUSEA MEDICATION  *UNUSUAL SHORTNESS OF BREATH  *UNUSUAL BRUISING OR BLEEDING  TENDERNESS IN MOUTH AND THROAT WITH OR WITHOUT PRESENCE OF ULCERS  *URINARY PROBLEMS  *BOWEL PROBLEMS  UNUSUAL RASH Items with * indicate a potential emergency and should be followed up as soon as possible.  Feel free to call the clinic should you have any questions or concerns. The clinic phone number is [phone].   It was my pleasure to take care of you today!  Leeanne Rio, RN

## 2013-07-27 NOTE — Progress Notes (Signed)
Educated patient Occupational hygienist and routine of the clinic. Provided patient with RADIATION THERAPY AND YOU handbook then, reviewed pertinent information. Educated patient reference potential side effects and management, such as urinary bladder changes and fatigue. All questions answered. Patient verbalized understanding of all reviewed.

## 2013-07-29 ENCOUNTER — Encounter: Payer: Self-pay | Admitting: Radiation Oncology

## 2013-07-29 NOTE — Progress Notes (Signed)
  Radiation Oncology         (336) [phone] ________________________________  Name: Patrick Lane MRN: [id number]  Date: 07/27/2013  DOB: 1928/01/21  Weekly Radiation Therapy Management  Current Dose: 5.4 Gy     Planned Dose:  64.8 Gy  Narrative . . . . . . . . The patient presents for routine under treatment assessment.                                   The patient is without complaint.                                 Set-up films were reviewed.                                 The chart was checked. Physical Findings. . .  weight is 205 lb 3.2 oz (93.078 kg). His blood pressure is 132/71 and his pulse is 105. His respiration is 18. . Weight essentially stable.  No significant changes. Impression . . . . . . . The patient is tolerating radiation. Plan . . . . . . . . . . . . Continue treatment as planned.  ________________________________  Sheral Apley. Tammi Klippel, M.D.

## 2013-07-30 ENCOUNTER — Telehealth: Payer: Self-pay | Admitting: *Deleted

## 2013-07-30 ENCOUNTER — Ambulatory Visit
Admission: RE | Admit: 2013-07-30 | Discharge: 2013-07-30 | Disposition: A | Discharge status: Home / Self Care | Payer: Medicare Other | Source: Ambulatory Visit | Attending: Radiation Oncology | Admitting: Radiation Oncology

## 2013-07-30 NOTE — Telephone Encounter (Signed)
PT. IS EATING AND FORCING FLUIDS. NO NAUSEA OR VOMITING. PT.'S STOOLS HAVE BEEN NORMAL FOR HIS SITUATION. NO PROBLEMS WITH HIS MOUTH. PT. HAS HIS CHEMOTHERAPY MEDICATION SHEET FOR A REFERENCE. PT. WILL CALL THIS OFFICE OR THE PHYSICIAN ON CALL WHEN THE OFFICE IS CLOSE IF THE NEED ARISES.

## 2013-07-31 ENCOUNTER — Ambulatory Visit
Admission: RE | Admit: 2013-07-31 | Discharge: 2013-07-31 | Disposition: A | Discharge status: Home / Self Care | Payer: Medicare Other | Source: Ambulatory Visit | Attending: Radiation Oncology | Admitting: Radiation Oncology

## 2013-08-01 ENCOUNTER — Ambulatory Visit
Admission: RE | Admit: 2013-08-01 | Discharge: 2013-08-01 | Disposition: A | Discharge status: Home / Self Care | Payer: Medicare Other | Source: Ambulatory Visit | Attending: Radiation Oncology | Admitting: Radiation Oncology

## 2013-08-01 ENCOUNTER — Encounter: Payer: Self-pay | Admitting: Radiation Oncology

## 2013-08-01 VITALS — BP 131/68 | HR 105 | Temp 97.9°F | Ht 73.0 in | Wt 206.5 lb

## 2013-08-01 DIAGNOSIS — C679 Malignant neoplasm of bladder, unspecified: Secondary | ICD-10-CM

## 2013-08-01 NOTE — Progress Notes (Signed)
  Radiation Oncology         (336) [phone] ________________________________  Name: Patrick Lane MRN: [id number]  Date: 08/01/2013  DOB: 1928/03/31  Weekly Radiation Therapy Management  Current Dose: 10.8 Gy     Planned Dose:  64.8 Gy  Narrative . . . . . . . . The patient presents for routine under treatment assessment.                                   The patient is without complaint.                                 Set-up films were reviewed.                                 The chart was checked. Physical Findings. . .  height is 6\' 1"  (1.854 m) and weight is 206 lb 8 oz (93.668 kg). His temperature is 97.9 F (36.6 C). His blood pressure is 131/68 and his pulse is 105. . Weight essentially stable.  No significant changes. Impression . . . . . . . The patient is tolerating radiation. Plan . . . . . . . . . . . . Continue treatment as planned.  ________________________________  Sheral Apley. Tammi Klippel, M.D.

## 2013-08-01 NOTE — Progress Notes (Signed)
Mr. Perusse has received 6 fractions to his pelvis for bladder.  He denies any dysuria, but states that he feels his urinary urgency is getting worse since the start of radiation.  He also has an increase in episodes of voiding which he attributes to an increase in fluid intake as well as an  increase episodes of incontinence.  He now wears a depends more than his normal.  VSS.  Is on Avodart 0.5 mg po daily for BPH

## 2013-08-02 ENCOUNTER — Ambulatory Visit
Admission: RE | Admit: 2013-08-02 | Discharge: 2013-08-02 | Disposition: A | Discharge status: Home / Self Care | Payer: Medicare Other | Source: Ambulatory Visit | Attending: Radiation Oncology | Admitting: Radiation Oncology

## 2013-08-03 ENCOUNTER — Other Ambulatory Visit (HOSPITAL_BASED_OUTPATIENT_CLINIC_OR_DEPARTMENT_OTHER): Payer: Commercial Managed Care - HMO

## 2013-08-03 ENCOUNTER — Ambulatory Visit (HOSPITAL_BASED_OUTPATIENT_CLINIC_OR_DEPARTMENT_OTHER): Payer: Medicare HMO | Admitting: Oncology

## 2013-08-03 ENCOUNTER — Other Ambulatory Visit: Payer: Medicare Other

## 2013-08-03 ENCOUNTER — Telehealth: Payer: Self-pay | Admitting: *Deleted

## 2013-08-03 ENCOUNTER — Other Ambulatory Visit: Payer: Self-pay | Admitting: *Deleted

## 2013-08-03 ENCOUNTER — Ambulatory Visit
Admission: RE | Admit: 2013-08-03 | Discharge: 2013-08-03 | Disposition: A | Discharge status: Home / Self Care | Payer: Medicare Other | Source: Ambulatory Visit | Attending: Radiation Oncology | Admitting: Radiation Oncology

## 2013-08-03 ENCOUNTER — Ambulatory Visit (HOSPITAL_BASED_OUTPATIENT_CLINIC_OR_DEPARTMENT_OTHER): Payer: Commercial Managed Care - HMO

## 2013-08-03 VITALS — BP 148/77 | HR 108 | Temp 98.3°F | Resp 18 | Ht 73.0 in | Wt 203.0 lb

## 2013-08-03 DIAGNOSIS — R197 Diarrhea, unspecified: Secondary | ICD-10-CM

## 2013-08-03 DIAGNOSIS — C679 Malignant neoplasm of bladder, unspecified: Secondary | ICD-10-CM

## 2013-08-03 DIAGNOSIS — Z5111 Encounter for antineoplastic chemotherapy: Secondary | ICD-10-CM

## 2013-08-03 DIAGNOSIS — C779 Secondary and unspecified malignant neoplasm of lymph node, unspecified: Secondary | ICD-10-CM

## 2013-08-03 DIAGNOSIS — R110 Nausea: Secondary | ICD-10-CM

## 2013-08-03 DIAGNOSIS — C50919 Malignant neoplasm of unspecified site of unspecified female breast: Secondary | ICD-10-CM

## 2013-08-03 LAB — CBC WITH DIFFERENTIAL/PLATELET
BASO%: 0.2 % (ref 0.0–2.0)
BASOS ABS: 0 10*3/uL (ref 0.0–0.1)
EOS ABS: 0.3 10*3/uL (ref 0.0–0.5)
EOS%: 7 % (ref 0.0–7.0)
HCT: 39.8 % (ref 38.4–49.9)
HEMOGLOBIN: 13 g/dL (ref 13.0–17.1)
LYMPH%: 26.2 % (ref 14.0–49.0)
MCH: 30.4 pg (ref 27.2–33.4)
MCHC: 32.7 g/dL (ref 32.0–36.0)
MCV: 93 fL (ref 79.3–98.0)
MONO#: 0.4 10*3/uL (ref 0.1–0.9)
MONO%: 8.9 % (ref 0.0–14.0)
NEUT%: 57.7 % (ref 39.0–75.0)
NEUTROS ABS: 2.8 10*3/uL (ref 1.5–6.5)
PLATELETS: 220 10*3/uL (ref 140–400)
RBC: 4.28 10*6/uL (ref 4.20–5.82)
RDW: 14.6 % (ref 11.0–14.6)
WBC: 4.9 10*3/uL (ref 4.0–10.3)
lymph#: 1.3 10*3/uL (ref 0.9–3.3)
nRBC: 0 % (ref 0–0)

## 2013-08-03 LAB — COMPREHENSIVE METABOLIC PANEL (CC13)
ALT: 16 U/L (ref 0–55)
AST: 20 U/L (ref 5–34)
Albumin: 3.9 g/dL (ref 3.5–5.0)
Alkaline Phosphatase: 60 U/L (ref 40–150)
Anion Gap: 12 mEq/L — ABNORMAL HIGH (ref 3–11)
BILIRUBIN TOTAL: 0.57 mg/dL (ref 0.20–1.20)
BUN: 11.9 mg/dL (ref 7.0–26.0)
CO2: 27 meq/L (ref 22–29)
CREATININE: 0.8 mg/dL (ref 0.7–1.3)
Calcium: 9.9 mg/dL (ref 8.4–10.4)
Chloride: 103 mEq/L (ref 98–109)
GLUCOSE: 115 mg/dL (ref 70–140)
Potassium: 4.4 mEq/L (ref 3.5–5.1)
Sodium: 142 mEq/L (ref 136–145)
Total Protein: 7.6 g/dL (ref 6.4–8.3)

## 2013-08-03 MED ORDER — SODIUM CHLORIDE 0.9 % IV SOLN
Freq: Once | INTRAVENOUS | Status: AC
  Administered 2013-08-03: 13:00:00 via INTRAVENOUS

## 2013-08-03 MED ORDER — DEXAMETHASONE SODIUM PHOSPHATE 10 MG/ML IJ SOLN
INTRAMUSCULAR | Status: AC
  Filled 2013-08-03: qty 1

## 2013-08-03 MED ORDER — SODIUM CHLORIDE 0.9 % IV SOLN
200.0000 mg | Freq: Once | INTRAVENOUS | Status: AC
  Administered 2013-08-03: 200 mg via INTRAVENOUS
  Filled 2013-08-03: qty 20

## 2013-08-03 MED ORDER — DEXAMETHASONE SODIUM PHOSPHATE 10 MG/ML IJ SOLN
10.0000 mg | Freq: Once | INTRAMUSCULAR | Status: AC
  Administered 2013-08-03: 10 mg via INTRAVENOUS

## 2013-08-03 MED ORDER — ONDANSETRON 8 MG/50ML IVPB (CHCC)
8.0000 mg | Freq: Once | INTRAVENOUS | Status: AC
  Administered 2013-08-03: 8 mg via INTRAVENOUS

## 2013-08-03 MED ORDER — ONDANSETRON 8 MG/NS 50 ML IVPB
INTRAVENOUS | Status: AC
  Filled 2013-08-03: qty 8

## 2013-08-03 NOTE — Telephone Encounter (Signed)
appts made and printed...td 

## 2013-08-03 NOTE — Progress Notes (Signed)
Hematology and Oncology Follow Up Visit  Patrick Lane [id number] [birth year] 80 y.o. 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   Prior Therapy: He is status post a repeat TURBT on 06/25/2013 under the care of Dr. Risa Grill.   Current therapy: He is under consideration to start combined modality therapy radiation and chemotherapy. Chemotherapy will be given in the form of carboplatin AUC of 2 on a weekly basis with radiation started 07/27/2013.  Interim History:  [name] presents today for a followup visit. He is a pleasant gentleman with the above diagnosis presents today for evaluation before the next infusion of chemotherapy. He have tolerated the last chemotherapy without any complications. He has not reported any other complaints including constitutional symptoms and weakness. He has not reported any nausea, vomiting or any GI complications. He does report some loose bowel movements after week of radiation therapy. He has not reported any neuropathy or infusion-related problems for carboplatin.  Medications: I have reviewed the patient's current medications.  Current Outpatient Prescriptions  Medication Sig Dispense Refill  . AVODART 0.5 MG capsule Take 1 tablet by mouth Daily.      . COMBIVENT RESPIMAT 20-100 MCG/ACT AERS Take 2 puffs by mouth Every 4 hours as needed.      Marland Kitchen dextromethorphan-guaiFENesin (MUCINEX DM) 30-600 MG per 12 hr tablet Take 3 tablets by mouth daily.       Marland Kitchen HYDROcodone-acetaminophen (NORCO/VICODIN) 5-325 MG per tablet Take 1-2 tablets by mouth every 6 (six) hours as needed.  20 tablet  0  . metFORMIN (GLUCOPHAGE) 500 MG tablet Take 1,000 mg by mouth 2 (two) times daily with a meal.      . Multiple Vitamin (MULTIVITAMIN) tablet Take 1 tablet by mouth daily.      Marland Kitchen oxybutynin  (DITROPAN XL) 10 MG 24 hr tablet Take 1 tablet (10 mg total) by mouth daily.  20 tablet  0  . prochlorperazine (COMPAZINE) 10 MG tablet Take 1 tablet (10 mg total) by mouth every 6 (six) hours as needed for nausea or vomiting.  30 tablet  0   No current facility-administered medications for this visit.     Allergies:  Allergies  Allergen Reactions  . Celebrex [Celecoxib] Hives    Past Medical History, Surgical history, Social history, and Family History were reviewed and updated.  Review of Systems: Constitutional:  Negative for fever, chills, night sweats, anorexia, weight loss, pain.  Remaining ROS negative. Physical Exam: Blood pressure 148/77, pulse 108, temperature 98.3 F (36.8 C), temperature source Oral, resp. rate 18, height 6\' 1"  (1.854 m), weight 203 lb (92.08 kg). ECOG: 1 General appearance: alert, cooperative and appears stated age Head: Normocephalic, without obvious abnormality, atraumatic Neck: no adenopathy, no carotid bruit, no JVD, supple, symmetrical, trachea midline and thyroid not enlarged, symmetric, no tenderness/mass/nodules Lymph nodes: Cervical, supraclavicular, and axillary nodes normal. Heart:regular rate and rhythm, S1, S2 normal, no murmur, click, rub or gallop Lung:chest clear, no wheezing, rales, normal symmetric air entry Abdomin: soft, non-tender, without masses or organomegaly EXT:no erythema, induration, or nodules   Lab Results: Lab Results  Component Value Date   WBC 4.9 08/03/2013   HGB 13.0 08/03/2013   HCT 39.8 08/03/2013   MCV 93.0 08/03/2013   PLT 220 08/03/2013     Chemistry      Component Value Date/Time  NA 139 07/27/2013 1457   NA 139 06/25/2013 1211   K 4.5 07/27/2013 1457   K 4.2 06/25/2013 1211   CL 100 05/15/2013 0538   CO2 25 07/27/2013 1457   CO2 29 05/15/2013 0538   BUN 11.0 07/27/2013 1457   BUN 11 05/15/2013 0538   CREATININE 0.9 07/27/2013 1457   CREATININE 0.99 05/15/2013 0538      Component Value Date/Time   CALCIUM  10.0 07/27/2013 1457   CALCIUM 9.2 05/15/2013 0538   ALKPHOS 71 07/27/2013 1457   ALKPHOS 61 10/10/2009 1829   AST 23 07/27/2013 1457   AST 36 10/10/2009 1829   ALT 19 07/27/2013 1457   ALT 31 10/10/2009 1829   BILITOT 0.63 07/27/2013 1457   BILITOT 0.7 10/10/2009 1829      Impression and Plan:  This is an 80 year old gentleman with the following issues:  1. 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 He is not a candidate for cystectomy and will receive combined modality with radiation therapy and chemotherapy. He is on weekly carboplatin and with radiation therapy and have tolerated treatments well so far. The plan is to proceed with the second week of treatment today after reviewing his labs and clinical status. He will continue weekly carboplatin with radiation. 2. Nausea prophylaxis: I have given him a prescription for Compazine. 3. Diarrhea: I have instructed him to use over-the-counter Imodium at the time being. 4. Followup will be on 08/17/2013 for evaluation before his next chemotherapy than.   [facility], MD 1/16/201511:52 AM

## 2013-08-03 NOTE — Patient Instructions (Signed)
[facility] Discharge Instructions for Patients Receiving Chemotherapy  Today you received the following chemotherapy agents Carboplatin To help prevent nausea and vomiting after your treatment, we encourage you to take your nausea medication as prescribed.  If you develop nausea and vomiting that is not controlled by your nausea medication, call the clinic.   BELOW ARE SYMPTOMS THAT SHOULD BE REPORTED IMMEDIATELY:  *FEVER GREATER THAN 100.5 F  *CHILLS WITH OR WITHOUT FEVER  NAUSEA AND VOMITING THAT IS NOT CONTROLLED WITH YOUR NAUSEA MEDICATION  *UNUSUAL SHORTNESS OF BREATH  *UNUSUAL BRUISING OR BLEEDING  TENDERNESS IN MOUTH AND THROAT WITH OR WITHOUT PRESENCE OF ULCERS  *URINARY PROBLEMS  *BOWEL PROBLEMS  UNUSUAL RASH Items with * indicate a potential emergency and should be followed up as soon as possible.  Feel free to call the clinic you have any questions or concerns. The clinic phone number is [phone].    

## 2013-08-06 ENCOUNTER — Ambulatory Visit
Admission: RE | Admit: 2013-08-06 | Discharge: 2013-08-06 | Disposition: A | Discharge status: Home / Self Care | Payer: Medicare Other | Source: Ambulatory Visit | Attending: Radiation Oncology | Admitting: Radiation Oncology

## 2013-08-07 ENCOUNTER — Telehealth: Payer: Self-pay | Admitting: Oncology

## 2013-08-07 ENCOUNTER — Ambulatory Visit
Admission: RE | Admit: 2013-08-07 | Discharge: 2013-08-07 | Disposition: A | Discharge status: Home / Self Care | Payer: Medicare Other | Source: Ambulatory Visit | Attending: Radiation Oncology | Admitting: Radiation Oncology

## 2013-08-07 NOTE — Telephone Encounter (Signed)
s.w. pt and he requested mailed copy of sched...mailed pt appt sched, avs and letter

## 2013-08-08 ENCOUNTER — Encounter: Payer: Self-pay | Admitting: Radiation Oncology

## 2013-08-08 ENCOUNTER — Ambulatory Visit
Admission: RE | Admit: 2013-08-08 | Discharge: 2013-08-08 | Disposition: A | Discharge status: Home / Self Care | Payer: Medicare Other | Source: Ambulatory Visit | Attending: Radiation Oncology | Admitting: Radiation Oncology

## 2013-08-08 ENCOUNTER — Ambulatory Visit: Payer: Medicare Other | Admitting: Radiation Oncology

## 2013-08-08 NOTE — Progress Notes (Signed)
  Radiation Oncology         (336) [phone] ________________________________  Name: Patrick Lane MRN: [id number]  Date: 08/09/2013  DOB: 01/06/1928  Weekly Radiation Therapy Management  Current Dose: 21.6 Gy     Planned Dose:  64.8 Gy  Narrative . . . . . . . . The patient presents for routine under treatment assessment.                                   The patient is without complaint. Patient report urinary frequency. Explains he void every hour during the night and only approximately 3 tsp at a time. Denies hematuria or dysuria. Reports taking imodium two tab to control diarrhea. Reports fatigue due to lack of sleep r/t frequency. Denies pain at this time                                 Set-up films were reviewed.                                 The chart was checked. Physical Findings. . .  weight is 208 lb 12.8 oz (94.711 kg). His blood pressure is 150/77 and his pulse is 100. His respiration is 16 and oxygen saturation is 100%. . Weight essentially stable.  No significant changes. Impression . . . . . . . The patient is tolerating radiation. Plan . . . . . . . . . . . . Continue treatment as planned.  ________________________________  Sheral Apley. Tammi Klippel, M.D.

## 2013-08-09 ENCOUNTER — Ambulatory Visit
Admission: RE | Admit: 2013-08-09 | Discharge: 2013-08-09 | Disposition: A | Discharge status: Home / Self Care | Payer: Medicare Other | Source: Ambulatory Visit | Attending: Radiation Oncology | Admitting: Radiation Oncology

## 2013-08-09 ENCOUNTER — Encounter: Payer: Self-pay | Admitting: Radiation Oncology

## 2013-08-09 VITALS — BP 150/77 | HR 100 | Resp 16 | Wt 208.8 lb

## 2013-08-09 DIAGNOSIS — C679 Malignant neoplasm of bladder, unspecified: Secondary | ICD-10-CM

## 2013-08-09 NOTE — Progress Notes (Signed)
Patient report urinary frequency. Explains he void every hour during the night and only approximately 3 tsp at a time. Denies hematuria or dysuria. Reports taking imodium two tab to control diarrhea. Reports fatigue due to lack of sleep r/t frequency. Denies pain at this time.

## 2013-08-10 ENCOUNTER — Ambulatory Visit
Admission: RE | Admit: 2013-08-10 | Discharge: 2013-08-10 | Disposition: A | Discharge status: Home / Self Care | Payer: Medicare Other | Source: Ambulatory Visit | Attending: Radiation Oncology | Admitting: Radiation Oncology

## 2013-08-10 ENCOUNTER — Other Ambulatory Visit (HOSPITAL_BASED_OUTPATIENT_CLINIC_OR_DEPARTMENT_OTHER): Payer: Commercial Managed Care - HMO

## 2013-08-10 ENCOUNTER — Ambulatory Visit (HOSPITAL_BASED_OUTPATIENT_CLINIC_OR_DEPARTMENT_OTHER): Payer: Commercial Managed Care - HMO

## 2013-08-10 VITALS — BP 156/77 | HR 98 | Temp 98.4°F | Resp 18

## 2013-08-10 DIAGNOSIS — Z5111 Encounter for antineoplastic chemotherapy: Secondary | ICD-10-CM

## 2013-08-10 DIAGNOSIS — C50919 Malignant neoplasm of unspecified site of unspecified female breast: Secondary | ICD-10-CM

## 2013-08-10 DIAGNOSIS — C679 Malignant neoplasm of bladder, unspecified: Secondary | ICD-10-CM

## 2013-08-10 DIAGNOSIS — C779 Secondary and unspecified malignant neoplasm of lymph node, unspecified: Secondary | ICD-10-CM

## 2013-08-10 LAB — CBC WITH DIFFERENTIAL/PLATELET
BASO%: 0.2 % (ref 0.0–2.0)
BASOS ABS: 0 10*3/uL (ref 0.0–0.1)
EOS%: 7.6 % — ABNORMAL HIGH (ref 0.0–7.0)
Eosinophils Absolute: 0.4 10*3/uL (ref 0.0–0.5)
HEMATOCRIT: 39 % (ref 38.4–49.9)
HEMOGLOBIN: 12.9 g/dL — AB (ref 13.0–17.1)
LYMPH#: 1.1 10*3/uL (ref 0.9–3.3)
LYMPH%: 19.3 % (ref 14.0–49.0)
MCH: 30.7 pg (ref 27.2–33.4)
MCHC: 33.1 g/dL (ref 32.0–36.0)
MCV: 92.9 fL (ref 79.3–98.0)
MONO#: 0.5 10*3/uL (ref 0.1–0.9)
MONO%: 8.5 % (ref 0.0–14.0)
NEUT#: 3.5 10*3/uL (ref 1.5–6.5)
NEUT%: 64.4 % (ref 39.0–75.0)
Platelets: 169 10*3/uL (ref 140–400)
RBC: 4.2 10*6/uL (ref 4.20–5.82)
RDW: 15 % — ABNORMAL HIGH (ref 11.0–14.6)
WBC: 5.4 10*3/uL (ref 4.0–10.3)
nRBC: 0 % (ref 0–0)

## 2013-08-10 LAB — COMPREHENSIVE METABOLIC PANEL (CC13)
ALBUMIN: 3.8 g/dL (ref 3.5–5.0)
ALT: 18 U/L (ref 0–55)
AST: 21 U/L (ref 5–34)
Alkaline Phosphatase: 56 U/L (ref 40–150)
Anion Gap: 13 mEq/L — ABNORMAL HIGH (ref 3–11)
BUN: 11.3 mg/dL (ref 7.0–26.0)
CALCIUM: 9.8 mg/dL (ref 8.4–10.4)
CO2: 29 mEq/L (ref 22–29)
Chloride: 101 mEq/L (ref 98–109)
Creatinine: 0.8 mg/dL (ref 0.7–1.3)
Glucose: 110 mg/dl (ref 70–140)
POTASSIUM: 4.3 meq/L (ref 3.5–5.1)
Sodium: 142 mEq/L (ref 136–145)
Total Bilirubin: 0.58 mg/dL (ref 0.20–1.20)
Total Protein: 7.2 g/dL (ref 6.4–8.3)

## 2013-08-10 MED ORDER — DEXAMETHASONE SODIUM PHOSPHATE 10 MG/ML IJ SOLN
INTRAMUSCULAR | Status: AC
  Filled 2013-08-10: qty 1

## 2013-08-10 MED ORDER — ONDANSETRON 8 MG/50ML IVPB (CHCC)
8.0000 mg | Freq: Once | INTRAVENOUS | Status: AC
Start: 2013-08-10 — End: 2013-08-10
  Administered 2013-08-10: 8 mg via INTRAVENOUS

## 2013-08-10 MED ORDER — SODIUM CHLORIDE 0.9 % IV SOLN
Freq: Once | INTRAVENOUS | Status: AC
  Administered 2013-08-10: 20 mL via INTRAVENOUS

## 2013-08-10 MED ORDER — DEXAMETHASONE SODIUM PHOSPHATE 10 MG/ML IJ SOLN
10.0000 mg | Freq: Once | INTRAMUSCULAR | Status: AC
  Administered 2013-08-10: 10 mg via INTRAVENOUS

## 2013-08-10 MED ORDER — SODIUM CHLORIDE 0.9 % IV SOLN
200.0000 mg | Freq: Once | INTRAVENOUS | Status: AC
  Administered 2013-08-10: 200 mg via INTRAVENOUS
  Filled 2013-08-10: qty 20

## 2013-08-10 MED ORDER — ONDANSETRON 8 MG/NS 50 ML IVPB
INTRAVENOUS | Status: AC
  Filled 2013-08-10: qty 8

## 2013-08-13 ENCOUNTER — Ambulatory Visit
Admission: RE | Admit: 2013-08-13 | Discharge: 2013-08-13 | Disposition: A | Discharge status: Home / Self Care | Payer: Medicare Other | Source: Ambulatory Visit | Attending: Radiation Oncology | Admitting: Radiation Oncology

## 2013-08-14 ENCOUNTER — Ambulatory Visit
Admission: RE | Admit: 2013-08-14 | Discharge: 2013-08-14 | Disposition: A | Discharge status: Home / Self Care | Payer: Medicare Other | Source: Ambulatory Visit | Attending: Radiation Oncology | Admitting: Radiation Oncology

## 2013-08-15 ENCOUNTER — Ambulatory Visit
Admission: RE | Admit: 2013-08-15 | Discharge: 2013-08-15 | Disposition: A | Discharge status: Home / Self Care | Payer: Medicare Other | Source: Ambulatory Visit | Attending: Radiation Oncology | Admitting: Radiation Oncology

## 2013-08-16 ENCOUNTER — Ambulatory Visit
Admission: RE | Admit: 2013-08-16 | Discharge: 2013-08-16 | Disposition: A | Discharge status: Home / Self Care | Payer: Medicare Other | Source: Ambulatory Visit | Attending: Radiation Oncology | Admitting: Radiation Oncology

## 2013-08-17 ENCOUNTER — Ambulatory Visit
Admission: RE | Admit: 2013-08-17 | Discharge: 2013-08-17 | Disposition: A | Discharge status: Home / Self Care | Payer: Medicare Other | Source: Ambulatory Visit | Attending: Radiation Oncology | Admitting: Radiation Oncology

## 2013-08-17 ENCOUNTER — Ambulatory Visit (HOSPITAL_BASED_OUTPATIENT_CLINIC_OR_DEPARTMENT_OTHER): Payer: Commercial Managed Care - HMO | Admitting: Oncology

## 2013-08-17 ENCOUNTER — Encounter: Payer: Self-pay | Admitting: Oncology

## 2013-08-17 ENCOUNTER — Encounter: Payer: Self-pay | Admitting: Radiation Oncology

## 2013-08-17 ENCOUNTER — Ambulatory Visit (HOSPITAL_BASED_OUTPATIENT_CLINIC_OR_DEPARTMENT_OTHER): Payer: Commercial Managed Care - HMO

## 2013-08-17 ENCOUNTER — Other Ambulatory Visit (HOSPITAL_BASED_OUTPATIENT_CLINIC_OR_DEPARTMENT_OTHER): Payer: Medicare HMO

## 2013-08-17 VITALS — BP 133/72 | HR 110 | Temp 97.7°F | Ht 73.0 in | Wt 201.4 lb

## 2013-08-17 VITALS — BP 140/95 | HR 107 | Temp 97.4°F | Resp 18 | Ht 73.0 in | Wt 200.3 lb

## 2013-08-17 DIAGNOSIS — R197 Diarrhea, unspecified: Secondary | ICD-10-CM

## 2013-08-17 DIAGNOSIS — C679 Malignant neoplasm of bladder, unspecified: Secondary | ICD-10-CM

## 2013-08-17 DIAGNOSIS — Z5111 Encounter for antineoplastic chemotherapy: Secondary | ICD-10-CM

## 2013-08-17 LAB — COMPREHENSIVE METABOLIC PANEL (CC13)
ALBUMIN: 3.8 g/dL (ref 3.5–5.0)
ALK PHOS: 58 U/L (ref 40–150)
ALT: 23 U/L (ref 0–55)
AST: 27 U/L (ref 5–34)
Anion Gap: 14 mEq/L — ABNORMAL HIGH (ref 3–11)
BUN: 14.9 mg/dL (ref 7.0–26.0)
CO2: 27 mEq/L (ref 22–29)
Calcium: 10.1 mg/dL (ref 8.4–10.4)
Chloride: 103 mEq/L (ref 98–109)
Creatinine: 0.9 mg/dL (ref 0.7–1.3)
Glucose: 117 mg/dl (ref 70–140)
Potassium: 4.5 mEq/L (ref 3.5–5.1)
SODIUM: 144 meq/L (ref 136–145)
TOTAL PROTEIN: 7.2 g/dL (ref 6.4–8.3)
Total Bilirubin: 0.6 mg/dL (ref 0.20–1.20)

## 2013-08-17 LAB — CBC WITH DIFFERENTIAL/PLATELET
BASO%: 0.4 % (ref 0.0–2.0)
Basophils Absolute: 0 10*3/uL (ref 0.0–0.1)
EOS ABS: 0.6 10*3/uL — AB (ref 0.0–0.5)
EOS%: 11.4 % — AB (ref 0.0–7.0)
HCT: 39.1 % (ref 38.4–49.9)
HGB: 12.9 g/dL — ABNORMAL LOW (ref 13.0–17.1)
LYMPH%: 16.3 % (ref 14.0–49.0)
MCH: 30.8 pg (ref 27.2–33.4)
MCHC: 32.9 g/dL (ref 32.0–36.0)
MCV: 93.4 fL (ref 79.3–98.0)
MONO#: 0.5 10*3/uL (ref 0.1–0.9)
MONO%: 9.4 % (ref 0.0–14.0)
NEUT%: 62.5 % (ref 39.0–75.0)
NEUTROS ABS: 3.5 10*3/uL (ref 1.5–6.5)
Platelets: 207 10*3/uL (ref 140–400)
RBC: 4.19 10*6/uL — ABNORMAL LOW (ref 4.20–5.82)
RDW: 15.3 % — AB (ref 11.0–14.6)
WBC: 5.6 10*3/uL (ref 4.0–10.3)
lymph#: 0.9 10*3/uL (ref 0.9–3.3)

## 2013-08-17 MED ORDER — OXYBUTYNIN CHLORIDE ER 10 MG PO TB24
10.0000 mg | ORAL_TABLET | Freq: Every day | ORAL | Status: DC
Start: 2013-08-17 — End: 2013-09-14

## 2013-08-17 MED ORDER — ONDANSETRON 8 MG/50ML IVPB (CHCC)
8.0000 mg | Freq: Once | INTRAVENOUS | Status: AC
  Administered 2013-08-17: 8 mg via INTRAVENOUS

## 2013-08-17 MED ORDER — ONDANSETRON 8 MG/NS 50 ML IVPB
INTRAVENOUS | Status: AC
  Filled 2013-08-17: qty 8

## 2013-08-17 MED ORDER — SODIUM CHLORIDE 0.9 % IV SOLN
195.0000 mg | Freq: Once | INTRAVENOUS | Status: AC
  Administered 2013-08-17: 200 mg via INTRAVENOUS
  Filled 2013-08-17: qty 20

## 2013-08-17 MED ORDER — DEXAMETHASONE SODIUM PHOSPHATE 10 MG/ML IJ SOLN
10.0000 mg | Freq: Once | INTRAMUSCULAR | Status: AC
  Administered 2013-08-17: 10 mg via INTRAVENOUS

## 2013-08-17 MED ORDER — SODIUM CHLORIDE 0.9 % IV SOLN
Freq: Once | INTRAVENOUS | Status: AC
  Administered 2013-08-17: 16:00:00 via INTRAVENOUS

## 2013-08-17 MED ORDER — DEXAMETHASONE SODIUM PHOSPHATE 10 MG/ML IJ SOLN
INTRAMUSCULAR | Status: AC
  Filled 2013-08-17: qty 1

## 2013-08-17 NOTE — Progress Notes (Signed)
  Radiation Oncology         [phone]) [phone] ________________________________  Name: Patrick Lane MRN: [id number]  Date: 08/17/2013  DOB: [birth year]  Weekly Radiation Therapy Management  Current Dose: 32.4 Gy     Planned Dose:  64.8 Gy  Narrative . . . . . . . . The patient presents for routine under treatment assessment. He reports that he is having watery diarrhea in the evenings on the average of 5-6 times daily. He is taking Imodium as directed, but discovered he is eating raw salads, and other high fiber foods. Educated him that these type foods along with caffeine can worsen his diarrhea, and gave him an Information sheet that was reviewed about management of diarrhea and which foods to avoid. He stated that he has a friend that stays with him who will review the information with him, since he "does not have his glasses today."                                    The patient is without complaint.                                 Set-up films were reviewed.                                 The chart was checked. Physical Findings. . .  height is 6\' 1"  (1.854 m) and weight is 201 lb 6.4 oz (91.354 kg). His temperature is 97.7 F (36.5 C). His blood pressure is 133/72 and his pulse is 110. . Weight essentially stable.  No significant changes. Impression . . . . . . . The patient is tolerating radiation. Plan . . . . . . . . . . . . Continue treatment as planned.  ________________________________  Sheral Apley. Tammi Klippel, M.D.

## 2013-08-17 NOTE — Patient Instructions (Signed)
[facility] Discharge Instructions for Patients Receiving Chemotherapy  Today you received the following chemotherapy agents :  Carboplatin.  To help prevent nausea and vomiting after your treatment, we encourage you to take your nausea medication Compazine 10mg  by mouth every 6 hours as needed for nausea.  DO NOT Drive after taking Compazine as it can cause drowsiness.   If you develop nausea and vomiting that is not controlled by your nausea medication, call the clinic.   BELOW ARE SYMPTOMS THAT SHOULD BE REPORTED IMMEDIATELY:  *FEVER GREATER THAN 100.5 F  *CHILLS WITH OR WITHOUT FEVER  NAUSEA AND VOMITING THAT IS NOT CONTROLLED WITH YOUR NAUSEA MEDICATION  *UNUSUAL SHORTNESS OF BREATH  *UNUSUAL BRUISING OR BLEEDING  TENDERNESS IN MOUTH AND THROAT WITH OR WITHOUT PRESENCE OF ULCERS  *URINARY PROBLEMS  *BOWEL PROBLEMS  UNUSUAL RASH Items with * indicate a potential emergency and should be followed up as soon as possible.  Feel free to call the clinic you have any questions or concerns. The clinic phone number is [phone].

## 2013-08-17 NOTE — Progress Notes (Addendum)
[name] has received 18 fractions to his pelvic region for bladder cancer.  He reports that he is having watery diarrhea in the evenings on the average of 5-6 times daily.  He is taking Imodium as directed, but discovered he is eating raw salads, and other high fiber foods.  Educated him that these type foods along with caffeine can worsen his diarrhea, and gave him an  Information sheet that was reviewed about management of diarrhea and which foods to avoid.  He stated that he has a friend that stays with him who will review the information with him, since he "does not have his glasses today."    Mildly orthostatic as evidence by the above sitting and standing vitals.

## 2013-08-17 NOTE — Progress Notes (Signed)
Hematology and Oncology Follow Up Visit  Patrick Lane [id number] 11-16-1927 80 y.o. 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   Prior Therapy: He is status post a repeat TURBT on 06/25/2013 under the care of Dr. Risa Grill.   Current therapy: He started combined modality therapy radiation and chemotherapy on 07/27/2013. Chemotherapy will be given in the form of carboplatin AUC of 2 on a weekly basis with radiation.  Interim History:  [name] presents today for a followup visit. He is a pleasant gentleman with the above diagnosis presents today for evaluation before the next infusion of chemotherapy. He have tolerated the last chemotherapy without any complications. He has not reported any other complaints including constitutional symptoms and weakness. He has not reported any nausea, vomiting or any GI complications. He does report some loose bowel movements. Using Imodium up to 3 tabs per day. He has not reported any neuropathy or infusion-related problems for carboplatin.  Medications: I have reviewed the patient's current medications.  Current Outpatient Prescriptions  Medication Sig Dispense Refill  . AVODART 0.5 MG capsule Take 1 tablet by mouth Daily.      . COMBIVENT RESPIMAT 20-100 MCG/ACT AERS Take 2 puffs by mouth Every 4 hours as needed.      Marland Kitchen dextromethorphan-guaiFENesin (MUCINEX DM) 30-600 MG per 12 hr tablet Take 3 tablets by mouth daily.       Marland Kitchen HYDROcodone-acetaminophen (NORCO/VICODIN) 5-325 MG per tablet Take 1-2 tablets by mouth every 6 (six) hours as needed.  20 tablet  0  . loperamide (IMODIUM) 2 MG capsule Take by mouth as needed for diarrhea or loose stools.      . metFORMIN (GLUCOPHAGE) 500 MG tablet Take 1,000 mg by mouth 2 (two) times daily with a meal.      . Multiple Vitamin  (MULTIVITAMIN) tablet Take 1 tablet by mouth daily.      . traMADol (ULTRAM) 50 MG tablet Take by mouth every 6 (six) hours as needed.      Marland Kitchen oxybutynin (DITROPAN XL) 10 MG 24 hr tablet Take 1 tablet (10 mg total) by mouth daily.  30 tablet  0  . prochlorperazine (COMPAZINE) 10 MG tablet Take 1 tablet (10 mg total) by mouth every 6 (six) hours as needed for nausea or vomiting.  30 tablet  0   No current facility-administered medications for this visit.     Allergies:  Allergies  Allergen Reactions  . Celebrex [Celecoxib] Hives    Past Medical History, Surgical history, Social history, and Family History were reviewed and updated.  Review of Systems: Constitutional:  Negative for fever, chills, night sweats, anorexia, weight loss, pain.  Remaining ROS negative. Physical Exam: Blood pressure 140/95, pulse 107, temperature 97.4 F (36.3 C), temperature source Oral, resp. rate 18, height 6\' 1"  (1.854 m), weight 200 lb 4.8 oz (90.855 kg), SpO2 98.00%. ECOG: 1 General appearance: alert, cooperative and appears stated age Head: Normocephalic, without obvious abnormality, atraumatic Neck: no adenopathy, no carotid bruit, no JVD, supple, symmetrical, trachea midline and thyroid not enlarged, symmetric, no tenderness/mass/nodules Lymph nodes: Cervical, supraclavicular, and axillary nodes normal. Heart:regular rate and rhythm, S1, S2 normal, no murmur, click, rub or gallop Lung:chest clear, no wheezing, rales, normal symmetric air entry Abdomen: soft, non-tender, without masses or organomegaly EXT:no erythema, induration, or nodules   Lab Results: Lab Results  Component Value Date   WBC 5.6 08/17/2013   HGB 12.9* 08/17/2013   HCT 39.1 08/17/2013   MCV 93.4 08/17/2013   PLT 207 08/17/2013     Chemistry      Component Value Date/Time   NA 142 08/10/2013 1314   NA 139 06/25/2013 1211   K 4.3 08/10/2013 1314   K 4.2 06/25/2013 1211   CL 100 05/15/2013 0538   CO2 29 08/10/2013 1314   CO2  29 05/15/2013 0538   BUN 11.3 08/10/2013 1314   BUN 11 05/15/2013 0538   CREATININE 0.8 08/10/2013 1314   CREATININE 0.99 05/15/2013 0538      Component Value Date/Time   CALCIUM 9.8 08/10/2013 1314   CALCIUM 9.2 05/15/2013 0538   ALKPHOS 56 08/10/2013 1314   ALKPHOS 61 10/10/2009 1829   AST 21 08/10/2013 1314   AST 36 10/10/2009 1829   ALT 18 08/10/2013 1314   ALT 31 10/10/2009 1829   BILITOT 0.58 08/10/2013 1314   BILITOT 0.7 10/10/2009 1829      Impression and Plan:  This is an 80 year old gentleman with the following issues:  1. 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 He is not a candidate for cystectomy and will receive combined modality with radiation therapy and chemotherapy. He is on weekly carboplatin and with radiation therapy and have tolerated treatments well so far. The plan is to proceed with treatment today after reviewing his labs and clinical status. He will continue weekly carboplatin with radiation. 2. Nausea prophylaxis: I have given him a prescription for Compazine. 3. Diarrhea: I have instructed him to use over-the-counter Imodium at the time being. He was instructed to use 2 tabs after first loose stool and then 1 tab after each additional loose stool up to 8 tabs/24 hours. 4. Followup 1 week for lab/chemo and in 2 weeks to see MD.   Patrick Lane 1/30/20152:58 PM

## 2013-08-20 ENCOUNTER — Ambulatory Visit
Admission: RE | Admit: 2013-08-20 | Discharge: 2013-08-20 | Disposition: A | Discharge status: Home / Self Care | Payer: Medicare Other | Source: Ambulatory Visit | Attending: Radiation Oncology | Admitting: Radiation Oncology

## 2013-08-21 ENCOUNTER — Ambulatory Visit
Admission: RE | Admit: 2013-08-21 | Discharge: 2013-08-21 | Disposition: A | Discharge status: Home / Self Care | Payer: Medicare Other | Source: Ambulatory Visit | Attending: Radiation Oncology | Admitting: Radiation Oncology

## 2013-08-22 ENCOUNTER — Ambulatory Visit
Admission: RE | Admit: 2013-08-22 | Discharge: 2013-08-22 | Disposition: A | Discharge status: Home / Self Care | Payer: Medicare Other | Source: Ambulatory Visit | Attending: Radiation Oncology | Admitting: Radiation Oncology

## 2013-08-23 ENCOUNTER — Ambulatory Visit
Admission: RE | Admit: 2013-08-23 | Discharge: 2013-08-23 | Disposition: A | Discharge status: Home / Self Care | Payer: Medicare Other | Source: Ambulatory Visit | Attending: Radiation Oncology | Admitting: Radiation Oncology

## 2013-08-24 ENCOUNTER — Ambulatory Visit
Admission: RE | Admit: 2013-08-24 | Discharge: 2013-08-24 | Disposition: A | Discharge status: Home / Self Care | Payer: Medicare Other | Source: Ambulatory Visit | Attending: Radiation Oncology | Admitting: Radiation Oncology

## 2013-08-24 ENCOUNTER — Ambulatory Visit (HOSPITAL_BASED_OUTPATIENT_CLINIC_OR_DEPARTMENT_OTHER): Payer: Commercial Managed Care - HMO

## 2013-08-24 ENCOUNTER — Encounter: Payer: Self-pay | Admitting: Radiation Oncology

## 2013-08-24 VITALS — BP 158/62 | HR 100 | Temp 98.6°F | Resp 18

## 2013-08-24 VITALS — BP 134/72 | HR 115 | Temp 98.8°F | Resp 20 | Wt 198.8 lb

## 2013-08-24 DIAGNOSIS — C679 Malignant neoplasm of bladder, unspecified: Secondary | ICD-10-CM

## 2013-08-24 DIAGNOSIS — Z5111 Encounter for antineoplastic chemotherapy: Secondary | ICD-10-CM

## 2013-08-24 LAB — CBC WITH DIFFERENTIAL/PLATELET
BASO%: 0.2 % (ref 0.0–2.0)
BASOS ABS: 0 10*3/uL (ref 0.0–0.1)
EOS ABS: 0.1 10*3/uL (ref 0.0–0.5)
EOS%: 2.4 % (ref 0.0–7.0)
HEMATOCRIT: 32.7 % — AB (ref 38.4–49.9)
HEMOGLOBIN: 11.1 g/dL — AB (ref 13.0–17.1)
LYMPH#: 0.5 10*3/uL — AB (ref 0.9–3.3)
LYMPH%: 9.9 % — ABNORMAL LOW (ref 14.0–49.0)
MCH: 31.5 pg (ref 27.2–33.4)
MCHC: 33.9 g/dL (ref 32.0–36.0)
MCV: 93 fL (ref 79.3–98.0)
MONO#: 0.5 10*3/uL (ref 0.1–0.9)
MONO%: 11 % (ref 0.0–14.0)
NEUT%: 76.5 % — AB (ref 39.0–75.0)
NEUTROS ABS: 3.6 10*3/uL (ref 1.5–6.5)
Platelets: 130 10*3/uL — ABNORMAL LOW (ref 140–400)
RBC: 3.51 10*6/uL — ABNORMAL LOW (ref 4.20–5.82)
RDW: 16.3 % — AB (ref 11.0–14.6)
WBC: 4.7 10*3/uL (ref 4.0–10.3)

## 2013-08-24 LAB — COMPREHENSIVE METABOLIC PANEL (CC13)
ALT: 9 U/L (ref 0–55)
ANION GAP: 11 meq/L (ref 3–11)
AST: 14 U/L (ref 5–34)
Albumin: 3.3 g/dL — ABNORMAL LOW (ref 3.5–5.0)
Alkaline Phosphatase: 56 U/L (ref 40–150)
BUN: 15.8 mg/dL (ref 7.0–26.0)
CALCIUM: 9.7 mg/dL (ref 8.4–10.4)
CO2: 26 meq/L (ref 22–29)
Chloride: 100 mEq/L (ref 98–109)
Creatinine: 0.9 mg/dL (ref 0.7–1.3)
Glucose: 141 mg/dl — ABNORMAL HIGH (ref 70–140)
POTASSIUM: 4.3 meq/L (ref 3.5–5.1)
Sodium: 137 mEq/L (ref 136–145)
Total Bilirubin: 1.52 mg/dL — ABNORMAL HIGH (ref 0.20–1.20)
Total Protein: 6.8 g/dL (ref 6.4–8.3)

## 2013-08-24 MED ORDER — SODIUM CHLORIDE 0.9 % IV SOLN
195.0000 mg | Freq: Once | INTRAVENOUS | Status: AC
  Administered 2013-08-24: 200 mg via INTRAVENOUS
  Filled 2013-08-24: qty 20

## 2013-08-24 MED ORDER — DEXAMETHASONE SODIUM PHOSPHATE 10 MG/ML IJ SOLN
INTRAMUSCULAR | Status: AC
  Filled 2013-08-24: qty 1

## 2013-08-24 MED ORDER — ONDANSETRON 8 MG/50ML IVPB (CHCC)
8.0000 mg | Freq: Once | INTRAVENOUS | Status: AC
  Administered 2013-08-24: 8 mg via INTRAVENOUS

## 2013-08-24 MED ORDER — DEXAMETHASONE SODIUM PHOSPHATE 10 MG/ML IJ SOLN
10.0000 mg | Freq: Once | INTRAMUSCULAR | Status: AC
  Administered 2013-08-24: 10 mg via INTRAVENOUS

## 2013-08-24 MED ORDER — ONDANSETRON 8 MG/NS 50 ML IVPB
INTRAVENOUS | Status: AC
  Filled 2013-08-24: qty 8

## 2013-08-24 MED ORDER — SODIUM CHLORIDE 0.9 % IV SOLN
Freq: Once | INTRAVENOUS | Status: AC
  Administered 2013-08-24: 15:00:00 via INTRAVENOUS

## 2013-08-24 NOTE — Progress Notes (Signed)
Pt c/o weakness, fatigue, loss of appetite. He states he had diarrhea x 3 yesterday, took Imodium last night and this morning. He has not had diarrhea since last night. He states he has urinary frequency, nocturia every hour, urgency and dribbling, voids very small amounts of yellow urine. He denies dysuria.

## 2013-08-24 NOTE — Progress Notes (Signed)
  Radiation Oncology         ([phone]) [phone] ________________________________  Name: Patrick Lane MRN: [id number]  Date: 08/24/2013  DOB: Jun 30, 1928  Weekly Radiation Therapy Management  Current Dose: 41.4 Gy     Planned Dose:  64.8 Gy  Narrative . . . . . . . . The patient presents for routine under treatment assessment.                                   The patient is without complaint.                                 Set-up films were reviewed.                                 The chart was checked. Physical Findings. . .  weight is 198 lb 12.8 oz (90.175 kg). His temperature is 98.8 F (37.1 C). His blood pressure is 134/72 and his pulse is 115. His respiration is 20. . Weight essentially stable.  No significant changes. Impression . . . . . . . The patient is tolerating radiation. Plan . . . . . . . . . . . . Continue treatment as planned.  ________________________________  Sheral Apley. Tammi Klippel, M.D.

## 2013-08-24 NOTE — Patient Instructions (Signed)
[facility] Discharge Instructions for Patients Receiving Chemotherapy  Today you received the following chemotherapy agents Carboplatin To help prevent nausea and vomiting after your treatment, we encourage you to take your nausea medication as prescribed.  If you develop nausea and vomiting that is not controlled by your nausea medication, call the clinic.   BELOW ARE SYMPTOMS THAT SHOULD BE REPORTED IMMEDIATELY:  *FEVER GREATER THAN 100.5 F  *CHILLS WITH OR WITHOUT FEVER  NAUSEA AND VOMITING THAT IS NOT CONTROLLED WITH YOUR NAUSEA MEDICATION  *UNUSUAL SHORTNESS OF BREATH  *UNUSUAL BRUISING OR BLEEDING  TENDERNESS IN MOUTH AND THROAT WITH OR WITHOUT PRESENCE OF ULCERS  *URINARY PROBLEMS  *BOWEL PROBLEMS  UNUSUAL RASH Items with * indicate a potential emergency and should be followed up as soon as possible.  Feel free to call the clinic you have any questions or concerns. The clinic phone number is [phone].    

## 2013-08-27 ENCOUNTER — Ambulatory Visit
Admission: RE | Admit: 2013-08-27 | Discharge: 2013-08-27 | Disposition: A | Discharge status: Home / Self Care | Payer: Medicare Other | Source: Ambulatory Visit | Attending: Radiation Oncology | Admitting: Radiation Oncology

## 2013-08-27 ENCOUNTER — Encounter: Payer: Self-pay | Admitting: Radiation Oncology

## 2013-08-28 ENCOUNTER — Ambulatory Visit
Admission: RE | Admit: 2013-08-28 | Discharge: 2013-08-28 | Disposition: A | Discharge status: Home / Self Care | Payer: Medicare Other | Source: Ambulatory Visit | Attending: Radiation Oncology | Admitting: Radiation Oncology

## 2013-08-29 ENCOUNTER — Ambulatory Visit
Admission: RE | Admit: 2013-08-29 | Discharge: 2013-08-29 | Disposition: A | Discharge status: Home / Self Care | Payer: Medicare Other | Source: Ambulatory Visit | Attending: Radiation Oncology | Admitting: Radiation Oncology

## 2013-08-29 ENCOUNTER — Inpatient Hospital Stay (HOSPITAL_COMMUNITY): Payer: Medicare HMO

## 2013-08-29 ENCOUNTER — Encounter (HOSPITAL_COMMUNITY): Payer: Self-pay | Admitting: *Deleted

## 2013-08-29 ENCOUNTER — Encounter: Payer: Self-pay | Admitting: Radiation Oncology

## 2013-08-29 ENCOUNTER — Inpatient Hospital Stay (HOSPITAL_COMMUNITY)
Admission: AD | Admit: 2013-08-29 | Discharge: 2013-08-31 | DRG: 190 | Disposition: A | Discharge status: Home / Self Care | Payer: Medicare HMO | Source: Ambulatory Visit | Attending: Internal Medicine | Admitting: Internal Medicine

## 2013-08-29 DIAGNOSIS — Z8551 Personal history of malignant neoplasm of bladder: Secondary | ICD-10-CM

## 2013-08-29 DIAGNOSIS — I714 Abdominal aortic aneurysm, without rupture, unspecified: Secondary | ICD-10-CM | POA: Diagnosis present

## 2013-08-29 DIAGNOSIS — Z8711 Personal history of peptic ulcer disease: Secondary | ICD-10-CM

## 2013-08-29 DIAGNOSIS — T451X5A Adverse effect of antineoplastic and immunosuppressive drugs, initial encounter: Secondary | ICD-10-CM | POA: Diagnosis present

## 2013-08-29 DIAGNOSIS — Z923 Personal history of irradiation: Secondary | ICD-10-CM

## 2013-08-29 DIAGNOSIS — J4489 Other specified chronic obstructive pulmonary disease: Secondary | ICD-10-CM | POA: Diagnosis present

## 2013-08-29 DIAGNOSIS — Z9221 Personal history of antineoplastic chemotherapy: Secondary | ICD-10-CM

## 2013-08-29 DIAGNOSIS — J9600 Acute respiratory failure, unspecified whether with hypoxia or hypercapnia: Secondary | ICD-10-CM | POA: Diagnosis present

## 2013-08-29 DIAGNOSIS — C672 Malignant neoplasm of lateral wall of bladder: Secondary | ICD-10-CM | POA: Diagnosis present

## 2013-08-29 DIAGNOSIS — J471 Bronchiectasis with (acute) exacerbation: Secondary | ICD-10-CM | POA: Diagnosis present

## 2013-08-29 DIAGNOSIS — Z8249 Family history of ischemic heart disease and other diseases of the circulatory system: Secondary | ICD-10-CM

## 2013-08-29 DIAGNOSIS — Y842 Radiological procedure and radiotherapy as the cause of abnormal reaction of the patient, or of later complication, without mention of misadventure at the time of the procedure: Secondary | ICD-10-CM | POA: Diagnosis present

## 2013-08-29 DIAGNOSIS — N471 Phimosis: Secondary | ICD-10-CM

## 2013-08-29 DIAGNOSIS — Z96659 Presence of unspecified artificial knee joint: Secondary | ICD-10-CM

## 2013-08-29 DIAGNOSIS — J449 Chronic obstructive pulmonary disease, unspecified: Secondary | ICD-10-CM | POA: Diagnosis present

## 2013-08-29 DIAGNOSIS — J441 Chronic obstructive pulmonary disease with (acute) exacerbation: Secondary | ICD-10-CM

## 2013-08-29 DIAGNOSIS — D649 Anemia, unspecified: Secondary | ICD-10-CM | POA: Diagnosis present

## 2013-08-29 DIAGNOSIS — E785 Hyperlipidemia, unspecified: Secondary | ICD-10-CM | POA: Diagnosis present

## 2013-08-29 DIAGNOSIS — K219 Gastro-esophageal reflux disease without esophagitis: Secondary | ICD-10-CM | POA: Diagnosis present

## 2013-08-29 DIAGNOSIS — E119 Type 2 diabetes mellitus without complications: Secondary | ICD-10-CM | POA: Diagnosis present

## 2013-08-29 DIAGNOSIS — D72819 Decreased white blood cell count, unspecified: Secondary | ICD-10-CM | POA: Diagnosis present

## 2013-08-29 DIAGNOSIS — D6481 Anemia due to antineoplastic chemotherapy: Secondary | ICD-10-CM | POA: Diagnosis present

## 2013-08-29 DIAGNOSIS — E43 Unspecified severe protein-calorie malnutrition: Secondary | ICD-10-CM | POA: Diagnosis present

## 2013-08-29 DIAGNOSIS — R918 Other nonspecific abnormal finding of lung field: Secondary | ICD-10-CM

## 2013-08-29 DIAGNOSIS — K520 Gastroenteritis and colitis due to radiation: Secondary | ICD-10-CM | POA: Diagnosis present

## 2013-08-29 DIAGNOSIS — R197 Diarrhea, unspecified: Secondary | ICD-10-CM | POA: Diagnosis present

## 2013-08-29 DIAGNOSIS — Z6825 Body mass index (BMI) 25.0-25.9, adult: Secondary | ICD-10-CM

## 2013-08-29 DIAGNOSIS — C679 Malignant neoplasm of bladder, unspecified: Secondary | ICD-10-CM | POA: Diagnosis present

## 2013-08-29 DIAGNOSIS — R9389 Abnormal findings on diagnostic imaging of other specified body structures: Secondary | ICD-10-CM | POA: Diagnosis present

## 2013-08-29 LAB — COMPREHENSIVE METABOLIC PANEL
ALBUMIN: 2.9 g/dL — AB (ref 3.5–5.2)
ALT: 12 U/L (ref 0–53)
AST: 21 U/L (ref 0–37)
Alkaline Phosphatase: 70 U/L (ref 39–117)
BILIRUBIN TOTAL: 1.3 mg/dL — AB (ref 0.3–1.2)
BUN: 17 mg/dL (ref 6–23)
CALCIUM: 9.4 mg/dL (ref 8.4–10.5)
CO2: 23 mEq/L (ref 19–32)
Chloride: 93 mEq/L — ABNORMAL LOW (ref 96–112)
Creatinine, Ser: 1.14 mg/dL (ref 0.50–1.35)
GFR calc Af Amer: 67 mL/min — ABNORMAL LOW (ref >90)
GFR calc non Af Amer: 58 mL/min — ABNORMAL LOW (ref >90)
Glucose, Bld: 140 mg/dL — ABNORMAL HIGH (ref 70–99)
Potassium: 4.1 mEq/L (ref 3.7–5.3)
Sodium: 132 mEq/L — ABNORMAL LOW (ref 137–147)
Total Protein: 7.5 g/dL (ref 6.0–8.3)

## 2013-08-29 LAB — INFLUENZA PANEL BY PCR (TYPE A & B)
H1N1 flu by pcr: NOT DETECTED
INFLAPCR: NEGATIVE
INFLBPCR: NEGATIVE

## 2013-08-29 LAB — CBC
HEMATOCRIT: 29.8 % — AB (ref 39.0–52.0)
Hemoglobin: 10.6 g/dL — ABNORMAL LOW (ref 13.0–17.0)
MCH: 32.1 pg (ref 26.0–34.0)
MCHC: 35.6 g/dL (ref 30.0–36.0)
MCV: 90.3 fL (ref 78.0–100.0)
PLATELETS: 160 10*3/uL (ref 150–400)
RBC: 3.3 MIL/uL — ABNORMAL LOW (ref 4.22–5.81)
RDW: 16.4 % — AB (ref 11.5–15.5)
WBC: 2.5 10*3/uL — ABNORMAL LOW (ref 4.0–10.5)

## 2013-08-29 MED ORDER — DM-GUAIFENESIN ER 30-600 MG PO TB12
3.0000 | ORAL_TABLET | Freq: Every day | ORAL | Status: DC
  Administered 2013-08-30: 3 via ORAL
  Filled 2013-08-29: qty 3

## 2013-08-29 MED ORDER — TRAMADOL HCL 50 MG PO TABS: 50.0000 mg | ORAL_TABLET | Freq: Four times a day (QID) | ORAL | Status: DC | PRN

## 2013-08-29 MED ORDER — ADULT MULTIVITAMIN W/MINERALS CH
1.0000 | ORAL_TABLET | Freq: Every day | ORAL | Status: DC
  Administered 2013-08-30 – 2013-08-31 (×2): 1 via ORAL
  Filled 2013-08-29 (×2): qty 1

## 2013-08-29 MED ORDER — ACETAMINOPHEN 650 MG RE SUPP: 650.0000 mg | Freq: Four times a day (QID) | RECTAL | Status: DC | PRN

## 2013-08-29 MED ORDER — ALUM & MAG HYDROXIDE-SIMETH 200-200-20 MG/5ML PO SUSP: 30.0000 mL | Freq: Four times a day (QID) | ORAL | Status: DC | PRN

## 2013-08-29 MED ORDER — OXYBUTYNIN CHLORIDE ER 10 MG PO TB24
10.0000 mg | ORAL_TABLET | Freq: Every day | ORAL | Status: DC
Start: 2013-08-30 — End: 2013-08-31
  Administered 2013-08-30 – 2013-08-31 (×2): 10 mg via ORAL
  Filled 2013-08-29 (×2): qty 1

## 2013-08-29 MED ORDER — SODIUM CHLORIDE 0.9 % IV SOLN
INTRAVENOUS | Status: DC
  Administered 2013-08-29: 15:00:00 via INTRAVENOUS
  Administered 2013-08-30: 100 mL via INTRAVENOUS
  Administered 2013-08-31: 09:00:00 via INTRAVENOUS

## 2013-08-29 MED ORDER — ONDANSETRON HCL 4 MG/2ML IJ SOLN: 4.0000 mg | Freq: Four times a day (QID) | INTRAMUSCULAR | Status: DC | PRN

## 2013-08-29 MED ORDER — METHYLPREDNISOLONE SODIUM SUCC 40 MG IJ SOLR
40.0000 mg | Freq: Two times a day (BID) | INTRAMUSCULAR | Status: DC
  Administered 2013-08-29: 40 mg via INTRAVENOUS
  Filled 2013-08-29 (×4): qty 1

## 2013-08-29 MED ORDER — INSULIN ASPART 100 UNIT/ML ~~LOC~~ SOLN
0.0000 [IU] | Freq: Every day | SUBCUTANEOUS | Status: DC
  Administered 2013-08-30: 4 [IU] via SUBCUTANEOUS

## 2013-08-29 MED ORDER — DEXTROSE 5 % IV SOLN
500.0000 mg | INTRAVENOUS | Status: DC
  Administered 2013-08-29 – 2013-08-30 (×2): 500 mg via INTRAVENOUS
  Filled 2013-08-29 (×3): qty 500

## 2013-08-29 MED ORDER — INSULIN ASPART 100 UNIT/ML ~~LOC~~ SOLN
0.0000 [IU] | Freq: Three times a day (TID) | SUBCUTANEOUS | Status: DC
  Administered 2013-08-30 (×3): 2 [IU] via SUBCUTANEOUS
  Administered 2013-08-31: 3 [IU] via SUBCUTANEOUS

## 2013-08-29 MED ORDER — GUAIFENESIN 100 MG/5ML PO SYRP
200.0000 mg | ORAL_SOLUTION | ORAL | Status: DC | PRN
  Administered 2013-08-29 – 2013-08-30 (×4): 200 mg via ORAL
  Filled 2013-08-29 (×4): qty 10

## 2013-08-29 MED ORDER — IOHEXOL 300 MG/ML  SOLN
100.0000 mL | Freq: Once | INTRAMUSCULAR | Status: AC | PRN
  Administered 2013-08-29: 100 mL via INTRAVENOUS

## 2013-08-29 MED ORDER — ENOXAPARIN SODIUM 40 MG/0.4ML ~~LOC~~ SOLN
40.0000 mg | SUBCUTANEOUS | Status: DC
  Administered 2013-08-29 – 2013-08-30 (×2): 40 mg via SUBCUTANEOUS
  Filled 2013-08-29 (×4): qty 0.4

## 2013-08-29 MED ORDER — DUTASTERIDE 0.5 MG PO CAPS
0.5000 mg | ORAL_CAPSULE | Freq: Every day | ORAL | Status: DC
  Administered 2013-08-30 – 2013-08-31 (×2): 0.5 mg via ORAL
  Filled 2013-08-29 (×2): qty 1

## 2013-08-29 MED ORDER — ACETAMINOPHEN 325 MG PO TABS: 650.0000 mg | ORAL_TABLET | Freq: Four times a day (QID) | ORAL | Status: DC | PRN

## 2013-08-29 MED ORDER — MAGIC MOUTHWASH W/LIDOCAINE
10.0000 mL | Freq: Four times a day (QID) | ORAL | Status: DC | PRN
  Administered 2013-08-29 – 2013-08-30 (×3): 10 mL via ORAL
  Filled 2013-08-29: qty 10

## 2013-08-29 MED ORDER — SACCHAROMYCES BOULARDII 250 MG PO CAPS
250.0000 mg | ORAL_CAPSULE | Freq: Two times a day (BID) | ORAL | Status: DC
  Administered 2013-08-29 – 2013-08-31 (×4): 250 mg via ORAL
  Filled 2013-08-29 (×5): qty 1

## 2013-08-29 MED ORDER — HYDROCODONE-ACETAMINOPHEN 5-325 MG PO TABS
1.0000 | ORAL_TABLET | ORAL | Status: DC | PRN
  Administered 2013-08-30: 1 via ORAL
  Filled 2013-08-29: qty 2

## 2013-08-29 MED ORDER — IPRATROPIUM-ALBUTEROL 0.5-2.5 (3) MG/3ML IN SOLN: 3.0000 mL | Freq: Four times a day (QID) | RESPIRATORY_TRACT | Status: DC | PRN

## 2013-08-29 MED ORDER — ONDANSETRON HCL 4 MG PO TABS: 4.0000 mg | ORAL_TABLET | Freq: Four times a day (QID) | ORAL | Status: DC | PRN

## 2013-08-29 MED ORDER — DM-GUAIFENESIN ER 30-600 MG PO TB12
1.0000 | ORAL_TABLET | Freq: Two times a day (BID) | ORAL | Status: DC
  Administered 2013-08-29: 1 via ORAL
  Filled 2013-08-29 (×3): qty 1

## 2013-08-29 NOTE — Progress Notes (Signed)
Weekly Management Note Current Dose:46.8 Gy  Projected Dose:64.8 Gy   Narrative:  The patient presents for under treatment assessment.  CBCT/MVCT images/Port film x-rays were reviewed.  The chart was checked. He complains of diarrhea for the past few days up to 5 stools per day. He reports unsteadiness on his feet and "almost" falling several times.  He feels too weak to stand. He is incontinent of stool as well partially because he cannot get up to the bathroom fast enough.  He lives mostly alone although his son comes to be with him during the day and he has an older man who stays with him at night.  He also complains of chills and a productive cough (green sputum). He denies fevers or chest pain. He is doing what he can to increase his fluid intake. He continues to be incontinent of urine which is not new.   Physical Findings:  Appears weak and tachycardic. Increased respiratory effort. Wheezes on expiration bilaterally.   Vitals: T 98.4, P 115, BP 178/59, 02 sat 92% on room air. He felt he was too weak to stand for weight or orthostatic blood pressures.  Weight:  Wt Readings from Last 3 Encounters:  08/24/13 198 lb 12.8 oz (90.175 kg)  08/17/13 200 lb 4.8 oz (90.855 kg)  08/17/13 201 lb 6.4 oz (91.354 kg)   Lab Results  Component Value Date   WBC 4.7 08/24/2013   HGB 11.1* 08/24/2013   HCT 32.7* 08/24/2013   MCV 93.0 08/24/2013   PLT 130* 08/24/2013   Lab Results  Component Value Date   CREATININE 0.9 08/24/2013   BUN 15.8 08/24/2013   NA 137 08/24/2013   K 4.3 08/24/2013   CL 100 05/15/2013   CO2 26 08/24/2013     Impression:  The patient is tolerating radiation with more diarrhea, orthostasis and possible pneumonia.  Plan:  Given his age, lack of support at home, dehydration and productive cough, I have asked the hospitalists to evaluate him for admission. I believe and his son concurs that he is at a high fall risk until this diarrhea is corrected and he is rehydrated. I do not feel he will be  able to manage this as an outpatient.

## 2013-08-29 NOTE — H&P (Signed)
History and Physical  Patrick Lane IRJ:[id number] DOB: [birth year] DOA: 08/29/2013  Referring physician: Dr. Thea Silversmith PCP: Donnie Coffin, MD   Chief Complaint: Diarrhea, nausea, vomiting, cough.  History of Present Illness: Patrick Lane is an 80 y.o. male with a PMH of COPD managed with Combivent, DM, and bladder cancer status post TURBT 06/25/13, currently receiving XRT and chemotherapy (last given 08/24/13 with carboplatin), who was referred as a direct admission after being evaluated by Dr. Pablo Ledger.  The patient has had longstanding intermittent diarrhea, but reports that it has worsened, with up to 5 loose stools per day, but no melena/hematochezia or associated abdominal cramps.  The patient does report recent antibiotic use, but cannot recall which one.  He reports an episode of nausea and vomiting today.  The patient also has a cough productive of green mucous, but no fever or chills.  He has become progressively more weak and fatigued, and has been unsteady on his feet with episodes of fecal incontinence.    Review of Systems: Constitutional: No fever, no chills;  Appetite diminished; + 40 lb weight loss, no weight gain, + fatigue.  HEENT: No blurry vision, no diplopia, + recent pharyngitis, no dysphagia CV: No chest pain, no palpitations, no PND.  Resp: + SOB, + cough, no pleuritic pain. GI: + nausea, + vomiting, + diarrhea, no melena, no hematochezia, no constipation.  GU: + dysuria, no hematuria, no frequency, no urgency. MSK: + right shoulder myalgias, no arthralgias.  Neuro:  No headache, no focal neurological deficits, no history of seizures.  Psych: No depression, no anxiety.  Endo: No heat intolerance, no cold intolerance, no polyuria, no polydipsia  Skin: No rashes, no skin lesions.  Heme: + easy bruising.    Past Medical History Past Medical History  Diagnosis Date  . COPD (chronic obstructive pulmonary disease)   . Hyperlipidemia   . Type 2 diabetes mellitus     . Adult bronchiectasis   . Meatal stenosis   . History of atrial fibrillation without current medication     POST OP EPISODE 2004   . AAA (abdominal aortic aneurysm)     LAST DUPLEX ULTRASOUND 06-22-2010  3.7x3.6  . Productive cough   . Dyspnea on exertion   . GERD (gastroesophageal reflux disease)   . History of gastric ulcer   . Foot ulcer, left     BOTTOM OF LITTLE TOE , MONITORED BY PCP  DSG DAILY W/ ANTIBIOTIC OINTMENT AND EPSOM SALT SOAKS  . Arthritis   . Bursitis of shoulder     BOTH  . Frequency of urination   . Urgency of urination   . Nocturia   . Wears glasses   . Bladder cancer     DX  OCT 2014 W/ INVASIVE HIGH GRADE UROTHELIAL BLADDER CARCINOMA (ONCOLOGIST--  DR Alen Blew)     Past Surgical History Past Surgical History  Procedure Laterality Date  . Total knee arthroplasty Right 12-05-2002  . Excision right saphenous neuroma/ right knee arthroscopic lysis adhesions and manipulation  05-29-2003  . Shoulder arthroscopy with rotator cuff repair and subacromial decompression Left 03-02-2005    RESECTION OPEN DISTAL CLAVICLE   . Orif compound right femur fx  1970'S    AND RIGHT PATELLECTOMY /  HARDWARE REMOVED LATER  . Transthoracic echocardiogram  12-07-2002    NORMAL LV/ EF 55-65%  . Appendectomy  AGE 73  . Cataract extraction w/ intraocular lens  implant, bilateral    . Retinal detachment surgery Right 1980'S  .  Cystoscopy with urethral dilatation N/A 05/14/2013    Procedure: CYSTOSCOPY WITH MEATAL DILATION ;  Surgeon: Bernestine Amass, MD;  Location: [facility];  Service: Urology;  Laterality: N/A;  . Circumcision N/A 05/14/2013    Procedure: CIRCUMCISION ADULT;  Surgeon: Bernestine Amass, MD;  Location: [facility];  Service: Urology;  Laterality: N/A;  . Transurethral resection of bladder tumor N/A 05/14/2013    Procedure: TRANSURETHRAL RESECTION OF BLADDER TUMOR (TURBT);  Surgeon: Bernestine Amass, MD;  Location: [facility];  Service: Urology;  Laterality: N/A;  . Transurethral resection of bladder tumor N/A 06/25/2013    Procedure: REPEAT TRANSURETHRAL RESECTION OF BLADDER TUMOR (TURBT);  Surgeon: Bernestine Amass, MD;  Location[facility];  Service: Urology;  Laterality: N/A;     Social History: History   Social History  . Marital Status: Widowed    Spouse Name: N/A    Number of Children: 1  . Years of Education: N/A   Occupational History  . Retired Dealer   .     Social History Main Topics  . Smoking status: Former Smoker -- 1.50 packs/day for 70 years    Types: Cigarettes    Quit date: 07/20/1999  . Smokeless tobacco: Former Systems developer    Types: Chew  . Alcohol Use: No  . Drug Use: No  . Sexual Activity: Not on file   Other Topics Concern  . Not on file   Social History Narrative   Widowed.  Lives with a friend and has other family who help provide his care.  Normally ambulates independently.    Family History:  Family History  Problem Relation Age of Onset  . Heart disease Mother   . Cancer Mother     and 3 sisters and 2 brothers but unsure of type.    Allergies: Celebrex  Meds: Prior to Admission medications   Medication Sig Start Date End Date Taking? Authorizing Provider  AVODART 0.5 MG capsule Take 1 tablet by mouth Daily. 09/17/11   Historical Provider, MD  COMBIVENT RESPIMAT 20-100 MCG/ACT AERS Take 2 puffs by mouth Every 4 hours as needed. 10/04/11   Historical Provider, MD  dextromethorphan-guaiFENesin (MUCINEX DM) 30-600 MG per 12 hr tablet Take 3 tablets by mouth daily.     Historical Provider, MD  HYDROcodone-acetaminophen (NORCO/VICODIN) 5-325 MG per tablet Take 1-2 tablets by mouth every 6 (six) hours as needed. 06/25/13   Bernestine Amass, MD  loperamide (IMODIUM) 2 MG capsule Take by mouth as needed for diarrhea or loose stools.    Historical Provider, MD  metFORMIN (GLUCOPHAGE) 500 MG tablet Take 1,000 mg by mouth 2 (two) times daily with a meal.     Historical Provider, MD  Multiple Vitamin (MULTIVITAMIN) tablet Take 1 tablet by mouth daily.    Historical Provider, MD  oxybutynin (DITROPAN XL) 10 MG 24 hr tablet Take 1 tablet (10 mg total) by mouth daily. 08/17/13   Lora Paula, MD  prochlorperazine (COMPAZINE) 10 MG tablet Take 1 tablet (10 mg total) by mouth every 6 (six) hours as needed for nausea or vomiting. 07/18/13   Wyatt Portela, MD  traMADol (ULTRAM) 50 MG tablet Take by mouth every 6 (six) hours as needed.    Historical Provider, MD    Physical Exam: Filed Vitals:   08/29/13 1300 08/29/13 1416  BP:  151/71  Pulse:  61  Temp: 98.1 F (36.7 C) 98.1 F (36.7 C)  TempSrc:  Oral  Resp:  20  Height:  6' 1.5" (1.867 m)  Weight:  87.726 kg (193 lb 6.4 oz)  SpO2:  93%     Physical Exam: Blood pressure 151/71, pulse 61, temperature 98.1 F (36.7 C), temperature source Oral, resp. rate 20, height 6' 1.5" (1.867 m), weight 87.726 kg (193 lb 6.4 oz), SpO2 93.00%. Gen: No acute distress. Frail. Incontinent of stool. Head: Normocephalic, atraumatic. Eyes: PERRL, EOMI, sclerae nonicteric. Mouth: Oropharynx clear with poor dentition with multiple caries and missing teeth. Neck: Supple, no thyromegaly, no lymphadenopathy, no jugular venous distention. Chest: Lungs diminished with a few rhonchi and wheezes bilaterally. CV: Heart sounds are regular. No murmurs, rubs, or gallops. Abdomen: Soft, nontender, nondistended with normal active bowel sounds. Extremities: Extremities are without clubbing, edema, or cyanosis. Skin: Warm and dry. Neuro: Alert and oriented times 3; cranial nerves II through XII grossly intact. Psych: Mood and affect normal.  Labs on Admission:  Basic Metabolic Panel:  Recent Labs Lab 08/24/13 1418 08/29/13 1530  NA 137 132*  K 4.3 4.1  CL  --  93*  CO2 26 23  GLUCOSE 141* 140*  BUN 15.8 17  CREATININE 0.9 1.14  CALCIUM 9.7 9.4   Liver Function Tests:  Recent Labs Lab 08/24/13 1418  08/29/13 1530  AST 14 21  ALT 9 12  ALKPHOS 56 70  BILITOT 1.52* 1.3*  PROT 6.8 7.5  ALBUMIN 3.3* 2.9*   CBC:  Recent Labs Lab 08/24/13 1417 08/29/13 1530  WBC 4.7 2.5*  NEUTROABS 3.6  --   HGB 11.1* 10.6*  HCT 32.7* 29.8*  MCV 93.0 90.3  PLT 130* 160    Radiological Exams on Admission: Dg Chest Port 1 View  08/29/2013   CLINICAL DATA:  Shortness of breath.  EXAM: PORTABLE CHEST - 1 VIEW  COMPARISON:  DG CHEST 2 VIEW dated 05/14/2013; DG CHEST 2V dated 05/24/2012; CT CHEST W/CM dated 12/07/2002; CT ANGIO CHEST dated 03/05/2005  FINDINGS: Ill-defined nodularity in both upper lobes the. Linear reticular opacities at the lung bases bilaterally. Possible emphysema.  No paratracheal adenopathy observed. Atherosclerotic aortic arch. Subtly blunted left lateral costophrenic angle.  IMPRESSION: 1. Vague nodularity in the upper lobes. I cannot exclude neoplastic pulmonary nodules although much of this may be due to superimposed vascular and osseous structures. Chest CT (with contrast if feasible) is recommended for further characterization. 2. Subtle blunting of the left lateral costophrenic angle suggesting small left pleural effusion. 3. Faint reticular interstitial accentuation in the lung bases, nonspecific but possibly due to low grade inflammation or drug reaction. 4. Atherosclerotic aortic arch. 5. Suspected emphysema. These results will be called to the ordering clinician or representative by the Radiologist Assistant, and communication documented in the PACS Dashboard.   Electronically Signed   By: Sherryl Barters M.D.   On: 08/29/2013 16:35    Assessment/Plan Principal Problem:   COPD (chronic obstructive pulmonary disease) with chronic bronchitis and bronchiectasis with acute exacerbation Chest x-ray negative for pneumonia. Will treat with Solu-Medrol 40 mg every 12 hours and nebulized bronchodilator therapy. Provide supplemental oxygen as needed. Continue Mucinex DM. Send sputum  culture. Will start on empiric azithromycin. Active Problems:   Bladder cancer Will notify Dr. Risa Grill of the patient's admission.   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 Start probiotics.   Abnormal chest x-ray Upper lobe nodularity worrisome for metastasis. CT scan of the chest for further evaluation ordered.  Normocytic anemia Likely the sequela of prior chemotherapy. No current indication for transfusion.   Type 2 diabetes mellitus Hold metformin while in the hospital. Sliding scale insulin, sensitivity scale, q. a.c./at bedtime.   DVT prophylaxis Lovenox.  Code Status: Full. Family Communication: Rolland Bimler (nephew) [phone]. Disposition Plan: Home when stable.  Time spent: 70 minutes.  Shizuo Biskup [facility] Pager [phone] Cell: [phone]   If 7PM-7AM, please contact night-coverage [email or web address] Password [email or web address] 08/29/2013, 5:40 PM    **Disclaimer: This note was dictated with voice recognition software. Similar sounding words can inadvertently be transcribed and this note may contain transcription errors which may not have been corrected upon publication of note.**

## 2013-08-29 NOTE — Progress Notes (Signed)
UR completed 

## 2013-08-29 NOTE — Progress Notes (Signed)
Reports mild dysuria. Reports new onset urinary incontinence. Reports 5-6 episodes of diarrhea per day. Too weak to stand for orthostatics or weight. Reports decreased appetite. Reports he has increased his fluid intake. Reports a productive cough with green sputum. Reports he continues to take mucinex. Shortness of breath noted at rest. Denies chest pain. Expiratory wheezing heard.

## 2013-08-30 ENCOUNTER — Telehealth: Payer: Self-pay | Admitting: *Deleted

## 2013-08-30 ENCOUNTER — Other Ambulatory Visit: Payer: Self-pay | Admitting: Oncology

## 2013-08-30 ENCOUNTER — Ambulatory Visit
Admission: RE | Admit: 2013-08-30 | Discharge: 2013-08-30 | Disposition: A | Discharge status: Home / Self Care | Payer: Medicare Other | Source: Ambulatory Visit | Attending: Radiation Oncology | Admitting: Radiation Oncology

## 2013-08-30 DIAGNOSIS — J441 Chronic obstructive pulmonary disease with (acute) exacerbation: Principal | ICD-10-CM

## 2013-08-30 DIAGNOSIS — E43 Unspecified severe protein-calorie malnutrition: Secondary | ICD-10-CM

## 2013-08-30 DIAGNOSIS — C679 Malignant neoplasm of bladder, unspecified: Secondary | ICD-10-CM

## 2013-08-30 LAB — CBC
HCT: 29.2 % — ABNORMAL LOW (ref 39.0–52.0)
Hemoglobin: 10.3 g/dL — ABNORMAL LOW (ref 13.0–17.0)
MCH: 32.3 pg (ref 26.0–34.0)
MCHC: 35.3 g/dL (ref 30.0–36.0)
MCV: 91.5 fL (ref 78.0–100.0)
Platelets: 151 10*3/uL (ref 150–400)
RBC: 3.19 MIL/uL — ABNORMAL LOW (ref 4.22–5.81)
RDW: 16.3 % — AB (ref 11.5–15.5)
WBC: 2 10*3/uL — AB (ref 4.0–10.5)

## 2013-08-30 LAB — BASIC METABOLIC PANEL
BUN: 18 mg/dL (ref 6–23)
CHLORIDE: 96 meq/L (ref 96–112)
CO2: 22 mEq/L (ref 19–32)
CREATININE: 0.87 mg/dL (ref 0.50–1.35)
Calcium: 9.2 mg/dL (ref 8.4–10.5)
GFR calc non Af Amer: 78 mL/min — ABNORMAL LOW (ref >90)
GFR, EST AFRICAN AMERICAN: 90 mL/min — AB (ref >90)
Glucose, Bld: 186 mg/dL — ABNORMAL HIGH (ref 70–99)
POTASSIUM: 3.6 meq/L — AB (ref 3.7–5.3)
Sodium: 134 mEq/L — ABNORMAL LOW (ref 137–147)

## 2013-08-30 LAB — GLUCOSE, CAPILLARY
GLUCOSE-CAPILLARY: 157 mg/dL — AB (ref 70–99)
GLUCOSE-CAPILLARY: 176 mg/dL — AB (ref 70–99)
Glucose-Capillary: 126 mg/dL — ABNORMAL HIGH (ref 70–99)
Glucose-Capillary: 184 mg/dL — ABNORMAL HIGH (ref 70–99)
Glucose-Capillary: 197 mg/dL — ABNORMAL HIGH (ref 70–99)

## 2013-08-30 MED ORDER — IPRATROPIUM-ALBUTEROL 0.5-2.5 (3) MG/3ML IN SOLN
3.0000 mL | Freq: Three times a day (TID) | RESPIRATORY_TRACT | Status: DC
  Administered 2013-08-30 – 2013-08-31 (×2): 3 mL via RESPIRATORY_TRACT
  Filled 2013-08-30 (×2): qty 3

## 2013-08-30 MED ORDER — DM-GUAIFENESIN ER 30-600 MG PO TB12
2.0000 | ORAL_TABLET | Freq: Two times a day (BID) | ORAL | Status: DC
  Administered 2013-08-30 – 2013-08-31 (×2): 2 via ORAL
  Filled 2013-08-30 (×3): qty 2

## 2013-08-30 MED ORDER — PREDNISONE 50 MG PO TABS
60.0000 mg | ORAL_TABLET | Freq: Every day | ORAL | Status: DC
  Administered 2013-08-30 – 2013-08-31 (×2): 60 mg via ORAL
  Filled 2013-08-30 (×4): qty 1

## 2013-08-30 MED ORDER — GLUCERNA SHAKE PO LIQD
237.0000 mL | Freq: Three times a day (TID) | ORAL | Status: DC
  Administered 2013-08-31 (×2): 237 mL via ORAL
  Filled 2013-08-30 (×5): qty 237

## 2013-08-30 MED ORDER — LORAZEPAM 1 MG PO TABS
1.0000 mg | ORAL_TABLET | Freq: Once | ORAL | Status: AC
  Administered 2013-08-30: 1 mg via ORAL
  Filled 2013-08-30: qty 1

## 2013-08-30 MED ORDER — IPRATROPIUM-ALBUTEROL 0.5-2.5 (3) MG/3ML IN SOLN: 3.0000 mL | Freq: Three times a day (TID) | RESPIRATORY_TRACT | Status: DC

## 2013-08-30 MED ORDER — BUDESONIDE-FORMOTEROL FUMARATE 80-4.5 MCG/ACT IN AERO
2.0000 | INHALATION_SPRAY | Freq: Two times a day (BID) | RESPIRATORY_TRACT | Status: DC
  Administered 2013-08-30 – 2013-08-31 (×2): 2 via RESPIRATORY_TRACT
  Filled 2013-08-30: qty 6.9

## 2013-08-30 NOTE — Evaluation (Signed)
Clinical/Bedside Swallow Evaluation Patient Details  Name: Patrick Lane MRN: [id number] Date of Birth: [birth year]  Today's Date: 08/30/2013 Time: 1730-1759 SLP Time Calculation (min): 29 min  Past Medical History:  Past Medical History  Diagnosis Date  . COPD (chronic obstructive pulmonary disease)   . Hyperlipidemia   . Type 2 diabetes mellitus   . Adult bronchiectasis   . Meatal stenosis   . History of atrial fibrillation without current medication     POST OP EPISODE 2004   . AAA (abdominal aortic aneurysm)     LAST DUPLEX ULTRASOUND 06-22-2010  3.7x3.6  . Productive cough   . Dyspnea on exertion   . GERD (gastroesophageal reflux disease)   . History of gastric ulcer   . Foot ulcer, left     BOTTOM OF LITTLE TOE , MONITORED BY PCP  DSG DAILY W/ ANTIBIOTIC OINTMENT AND EPSOM SALT SOAKS  . Arthritis   . Bursitis of shoulder     BOTH  . Frequency of urination   . Urgency of urination   . Nocturia   . Wears glasses   . Bladder cancer     DX  OCT 2014 W/ INVASIVE HIGH GRADE UROTHELIAL BLADDER CARCINOMA (ONCOLOGIST--  DR Alen Blew)   Past Surgical History:  Past Surgical History  Procedure Laterality Date  . Total knee arthroplasty Right 12-05-2002  . Excision right saphenous neuroma/ right knee arthroscopic lysis adhesions and manipulation  05-29-2003  . Shoulder arthroscopy with rotator cuff repair and subacromial decompression Left 03-02-2005    RESECTION OPEN DISTAL CLAVICLE   . Orif compound right femur fx  1970'S    AND RIGHT PATELLECTOMY /  HARDWARE REMOVED LATER  . Transthoracic echocardiogram  12-07-2002    NORMAL LV/ EF 55-65%  . Appendectomy  AGE 26  . Cataract extraction w/ intraocular lens  implant, bilateral    . Retinal detachment surgery Right 1980'S  . Cystoscopy with urethral dilatation N/A 05/14/2013    Procedure: CYSTOSCOPY WITH MEATAL DILATION ;  Surgeon: Bernestine Amass, MD;  Location: [facility];  Service: Urology;  Laterality:  N/A;  . Circumcision N/A 05/14/2013    Procedure: CIRCUMCISION ADULT;  Surgeon: Bernestine Amass, MD;  Location: [facility];  Service: Urology;  Laterality: N/A;  . Transurethral resection of bladder tumor N/A 05/14/2013    Procedure: TRANSURETHRAL RESECTION OF BLADDER TUMOR (TURBT);  Surgeon: Bernestine Amass, MD;  [facility]  Service: Urology;  Laterality: N/A;  . Transurethral resection of bladder tumor N/A 06/25/2013    Procedure: REPEAT TRANSURETHRAL RESECTION OF BLADDER TUMOR (TURBT);  Surgeon: Bernestine Amass, MD;  Location: [facility];  Service: Urology;  Laterality: N/A;   HPI:  80 yo male adm to [facility] with h/o COPD, HLD, bladder cancer undergoing XRT/chemo, GERD, AAA, Afib, DM2, diarrhea ? from radiation colitis.  Pt CT chest showed Bilateral lower lobe bronchial wall thickening and tree-in-bud micronodularity. This is most commonly associated with infection but can also be seen in chronic aspiration. Mycobacterium avium is in the differential considerations although the pattern and distribution is somewhat atypical.   Md ordered bedside swallow evaluation.    Assessment / Plan / Recommendation Clinical Impression  Pt with functional oropharyngeal swallow ability based on clinical swallow evaluation.  No focal CN deficits present impacting swallow musculature.   No s/s of aspiration with po observed, pt does not use lower dentures dull to ill fitting and reports eating primarly soft foods  at home.  Pt does admit to infrequent choking with intake that has decreased recently as he has compensated by drinking liquids throughout his meals.  In addition pt pointed to esophagus to indicate areas of intermittent stasis/slow clearance.   SLP educated pt and family member to aspiration precautions given COPD.  Oral cavity was red tinged and pt reports tender around mouth- he denies xerostomia.    Rec continue diet with aspiration and esophageal  precautions.  All education completed, thanks for this order.     Aspiration Risk  Mild    Diet Recommendation Regular;Thin liquid (to allow pt to choose food items he can manage)   Liquid Administration via: Straw;Cup Medication Administration: Whole meds with liquid Supervision: Patient able to self feed Compensations: Slow rate;Small sips/bites;Follow solids with liquid Postural Changes and/or Swallow Maneuvers: Seated upright 90 degrees;Upright 30-60 min after meal    Other  Recommendations Oral Care Recommendations: Oral care BID   Follow Up Recommendations  None    Frequency and Duration        Pertinent Vitals/Pain Afebrile,decreased    SLP Swallow Goals     Swallow Study Prior Functional Status   see hhx- pt with weight loss and diarrhea issues prior to admission    General Date of Onset: 08/30/13 HPI: 80 yo male adm to [place] with h/o COPD, HLD, bladder cancer undergoing XRT/chemo, GERD, AAA, Afib, DM2, diarrhea ? from radiation colitis.  Pt CT chest showed Bilateral lower lobe bronchial wall thickening and tree-in-bud micronodularity. This is most commonly associated with infection but can also be seen in chronic aspiration. Mycobacterium avium is in the differential considerations although the pattern and distribution is somewhat atypical.   Md ordered bedside swallow evaluation.  Type of Study: Bedside swallow evaluation Diet Prior to this Study: Regular;Thin liquids Temperature Spikes Noted: No Respiratory Status: Room air History of Recent Intubation: No Behavior/Cognition: Alert;Cooperative;Pleasant mood Oral Cavity - Dentition: Dentures, top (lower dentures do not fit per pt, he does not use them, jaw bone atrophy) Self-Feeding Abilities: Able to feed self Patient Positioning: Upright in bed Baseline Vocal Quality: Clear Volitional Cough: Strong Volitional Swallow: Able to elicit    Oral/Motor/Sensory Function Overall Oral Motor/Sensory Function: Appears  within functional limits for tasks assessed   Ice Chips Ice chips: Not tested   Thin Liquid Thin Liquid: Within functional limits Presentation: Straw;Self Fed    Nectar Thick Nectar Thick Liquid: Not tested   Honey Thick Honey Thick Liquid: Not tested   Puree Puree: Within functional limits Presentation: Self Fed;Spoon   Solid   GO    Solid: Within functional limits       Luanna Salk, [place] [facility] SLP [phone]

## 2013-08-30 NOTE — Progress Notes (Addendum)
[facility] PROGRESS NOTE  Patrick Lane FIE:[id number] DOB: 24-Nov-1927 DOA: 08/29/2013 PCP: Donnie Coffin, MD  Assessment/Plan  COPD (chronic obstructive pulmonary disease) with chronic bronchitis and bronchiectasis with acute exacerbation  Chest x-ray negative for pneumonia.  -  D/c solumedrol -  Start prednisone -  Wean oxygen as tolerated -  Continue azithromycin  -  Schedule duonebs -  Start symbicort  Tree in bud micronodularity on CT:  Reviewed with Dr. Alva Garnet from pulmonlogy who felt this is likely secondary to chronic bronchitis and COPD.  Feels the pattern is less consistent with MAI.  Unlikely to be lymphangitic spread of cancer. -  Swallow evaluation -  Continue antibiotics and tx for COPD  Bladder cancer per Dr. 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 No current indication for transfusion.   Type 2 diabetes mellitus mildly elevated CBG -  Hold metformin  -  Continue low dose SSI and HS  Severe protein calorie malnutrition -  Continue supplements per nutrition recommendations  Diet:  diabetic Access:  PIV IVF:  yes Proph:  lovenox  Code Status: full Family Communication: patient and wife Disposition Plan: pending improvement in breathing and diarrhea   Consultants:  Dr. Alva Garnet, Pulm, via phone who reviewed CT chest  Procedures:  CT chest  CXR  Antibiotics:  Azithromycin 2/11 >>    HPI/Subjective:  Breathing improved, but still feels fatigued and having ongoing diarrhea  Objective: Filed Vitals:   08/29/13 1900 08/29/13 2200 08/30/13 0531 08/30/13 1342  BP: 98/54 105/72 98/51 119/58  Pulse: 67 76 89  79  Temp: 98.3 F (36.8 C) 98.4 F (36.9 C) 98.1 F (36.7 C) 98.6 F (37 C)  TempSrc: Oral Oral Oral Oral  Resp: 20 18 18 18   Height:      Weight:      SpO2: 98% 97% 96% 97%    Intake/Output Summary (Last 24 hours) at 08/30/13 1358 Last data filed at 08/30/13 1335  Gross per 24 hour  Intake   1990 ml  Output      0 ml  Net   1990 ml   Filed Weights   08/29/13 1416  Weight: 87.726 kg (193 lb 6.4 oz)    Exam:   General:  CM, No acute distress  HEENT:  NCAT, MMM  Cardiovascular:  RRR, nl S1, S2 no mrg, 2+ pulses, warm extremities  Respiratory:  diminished bilateral BS with rhonchi and rales at bases, no increased WOB  Abdomen:   Hyperactive BS, soft, NT/ND  MSK:   Normal tone and bulk, no LEE  Neuro:  Grossly intact  Data Reviewed: Basic Metabolic Panel:  Recent Labs Lab 08/24/13 1418 08/29/13 1530 08/30/13 0635  NA 137 132* 134*  K 4.3 4.1 3.6*  CL  --  93* 96  CO2 26 23 22   GLUCOSE 141* 140* 186*  BUN 15.8 17 18   CREATININE 0.9 1.14 0.87  CALCIUM 9.7 9.4 9.2   Liver Function Tests:  Recent Labs Lab 08/24/13 1418 08/29/13 1530  AST 14 21  ALT 9 12  ALKPHOS 56 70  BILITOT 1.52* 1.3*  PROT 6.8 7.5  ALBUMIN 3.3* 2.9*   No  results found for this basename: LIPASE, AMYLASE,  in the last 168 hours No results found for this basename: AMMONIA,  in the last 168 hours CBC:  Recent Labs Lab 08/24/13 1417 08/29/13 1530 08/30/13 0635  WBC 4.7 2.5* 2.0*  NEUTROABS 3.6  --   --   HGB 11.1* 10.6* 10.3*  HCT 32.7* 29.8* 29.2*  MCV 93.0 90.3 91.5  PLT 130* 160 151   Cardiac Enzymes: No results found for this basename: CKTOTAL, CKMB, CKMBINDEX, TROPONINI,  in the last 168 hours BNP (last 3 results) No results found for this basename: PROBNP,  in the last 8760 hours CBG:  Recent Labs Lab 08/29/13 1901 08/29/13 2205 08/30/13 0730 08/30/13 1135  GLUCAP 126* 157* 184* 176*    No results found for this or any previous visit (from the past  240 hour(s)).   Studies: Ct Chest W Contrast  08/30/2013   CLINICAL DATA:  Pneumonia. Abnormal chest x-ray. History of bladder cancer.  EXAM: CT CHEST WITH CONTRAST  TECHNIQUE: Multidetector CT imaging of the chest was performed during intravenous contrast administration.  CONTRAST:  174mL OMNIPAQUE IOHEXOL 300 MG/ML  SOLN  COMPARISON:  DG CHEST 1V PORT dated 08/29/2013  FINDINGS: Mild emphysema is present in the upper lobes. There is no upper lobe airspace disease. Scattered areas of tree-in-bud micro nodularity or present diffusely in the lower lobes. Additionally, there is bronchial wall thickening compatible with chronic bronchitis. Collapse and some consolidation of both posterior basal lower lobes is present, suggesting chronic infection including atypical such as mycobacterium avium. Mycobacterium avium is commonly associated with the tree in bud pattern although the distribution is somewhat atypical with sparing of the anterior basis. Coronary artery atherosclerosis is present. If office based assessment of coronary risk factors has not been performed, it is now recommended. There is no pericardial effusion. No pleural effusion. Incidental imaging of the upper abdomen is within normal limits. Tiny calcification is present along the superior gastric fundus. Aortic and branch vessel atherosclerosis is present with shaggy mural plaque extending into the upper abdomen. Old granulomatous disease of the liver. Osteopenia is present. No destructive osseous lesions of the thoracic spine. The sternum appears within normal limits.  There is no axillary adenopathy. Bilateral hilar adenopathy is present, probably reactive. Right hilar lymph node measures 16 mm Prescott Truex axis (image 34 series 2). Left hilar lymph node measures 11 mm Avah Bashor axis (image 30 series 2).  Notably, the opacity at the apices on radiography was due in part to projection and extensive costochondral calcification. The only architectural distortion of  the pulmonary parenchyma at the apices is due to emphysema.  IMPRESSION: 1. Bilateral lower lobe bronchial wall thickening and tree-in-bud micronodularity. This is most commonly associated with infection but can also be seen in chronic aspiration. Mycobacterium avium is in the differential considerations although the pattern and distribution is somewhat atypical. 2. Emphysema. 3. Bilateral hilar adenopathy is probably reactive. 4. Atherosclerosis and coronary artery disease.   Electronically Signed   By: Dereck Ligas M.D.   On: 08/30/2013 01:30   Dg Chest Port 1 View  08/29/2013   CLINICAL DATA:  Shortness of breath.  EXAM: PORTABLE CHEST - 1 VIEW  COMPARISON:  DG CHEST 2 VIEW dated 05/14/2013; DG CHEST 2V dated 05/24/2012; CT CHEST W/CM dated 12/07/2002; CT ANGIO CHEST dated 03/05/2005  FINDINGS: Ill-defined nodularity in both upper lobes the. Linear reticular opacities at the lung bases bilaterally. Possible emphysema.  No paratracheal adenopathy observed. Atherosclerotic aortic arch. Subtly  blunted left lateral costophrenic angle.  IMPRESSION: 1. Vague nodularity in the upper lobes. I cannot exclude neoplastic pulmonary nodules although much of this may be due to superimposed vascular and osseous structures. Chest CT (with contrast if feasible) is recommended for further characterization. 2. Subtle blunting of the left lateral costophrenic angle suggesting small left pleural effusion. 3. Faint reticular interstitial accentuation in the lung bases, nonspecific but possibly due to low grade inflammation or drug reaction. 4. Atherosclerotic aortic arch. 5. Suspected emphysema. These results will be called to the ordering clinician or representative by the Radiologist Assistant, and communication documented in the PACS Dashboard.   Electronically Signed   By: Sherryl Barters M.D.   On: 08/29/2013 16:35    Scheduled Meds: . azithromycin  500 mg Intravenous Q24H  . dextromethorphan-guaiFENesin  1 tablet Oral  BID  . dextromethorphan-guaiFENesin  3 tablet Oral Daily  . dutasteride  0.5 mg Oral Daily  . enoxaparin (LOVENOX) injection  40 mg Subcutaneous Q24H  . feeding supplement (GLUCERNA SHAKE)  237 mL Oral TID BM  . insulin aspart  0-5 Units Subcutaneous QHS  . insulin aspart  0-9 Units Subcutaneous TID WC  . multivitamin with minerals  1 tablet Oral Daily  . oxybutynin  10 mg Oral Daily  . predniSONE  60 mg Oral Q breakfast  . saccharomyces boulardii  250 mg Oral BID   Continuous Infusions: . 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 If 7PM-7AM, please contact night-coverage at [email or web address], password [facility] 08/30/2013, 1:58 PM  LOS: 1 day

## 2013-08-30 NOTE — Progress Notes (Signed)
[facility] Therapy Treatment Record Phone [phone]   Radiation Therapy was administered to Patrick Lane on: 08/30/2013  2:33 PM and was treatment # 27 out of a planned course of 36 treatments.

## 2013-08-30 NOTE — Progress Notes (Signed)
INITIAL NUTRITION ASSESSMENT  Pt meets criteria for severe MALNUTRITION in the context of acute illness as evidenced by <50% estimated energy intake with 3.5% weight loss in the past 2 weeks.  DOCUMENTATION CODES Per approved criteria  -Severe malnutrition in the context of acute illness or injury   INTERVENTION: - Glucerna shakes TID - Encouraged high calorie/protein small frequent meals - Will continue to monitor   NUTRITION DIAGNOSIS: Altered GI function related to diarrhea as evidenced by pt report.   Goal: 1. Resolution of diarrhea 2. Pt to consume >90% of meals/supplements  Monitor:  Weights, labs, intake, diarrhea   Reason for Assessment: Malnutrition screening tool, consult for assessment   80 y.o. male  Admitting Dx: COPD (chronic obstructive pulmonary disease) with chronic bronchitis  ASSESSMENT: Pt with hx of COPD managed, DM, and bladder cancer status post TURBT 06/25/13, currently receiving XRT and chemotherapy (last given 08/24/13 with carboplatin), who was referred as a direct admission. The patient has had longstanding intermittent diarrhea, but reports that it has worsened, with up to 5 loose stools per day, but no melena/hematochezia or associated abdominal cramps. The patient does report recent antibiotic use, but cannot recall which one. He reports an episode of nausea and vomiting yesterday. The patient also has a cough productive of green mucous, but no fever or chills. He has become progressively more weak and fatigued, and has been unsteady on his feet with episodes of fecal incontinence.   Met with pt and family member who report pt had been consuming very little in the past 2 weeks with 10-12 pound unintended weight loss. Pt was mostly consuming juice during this time frame with 1 small meal/day and 1 Ensure/day. Reports having diarrhea on/off in the past 2 months with 39 pound unintended weight loss in the past 6 weeks. States he was trying to stay hydrated at  home. States his blood sugars typically run between 115-120 mg/dL at home. Ate almost all of his breakfast today which consisted of grits, yogurt, and sausage. Reports 1 episode of loose stool this morning at 3am.   Nutrition Focused Physical Exam:  Subcutaneous Fat:  Orbital Region: WNL Upper Arm Region: WNL Thoracic and Lumbar Region: WNL  Muscle:  Temple Region: WNL Clavicle Bone Region: WNL Clavicle and Acromion Bone Region: WNL Scapular Bone Region: WNL Dorsal Hand: WNL Patellar Region: NA Anterior Thigh Region: NA Posterior Calf Region: NA  Edema: None noted   Potassium low - getting multivitamin  Total bilirubin elevated     Height: Ht Readings from Last 1 Encounters:  08/29/13 6' 1.5" (1.867 m)    Weight: Wt Readings from Last 1 Encounters:  08/29/13 193 lb 6.4 oz (87.726 kg)    Ideal Body Weight: 184 lb   % Ideal Body Weight: 105%  Wt Readings from Last 10 Encounters:  08/29/13 193 lb 6.4 oz (87.726 kg)  08/24/13 198 lb 12.8 oz (90.175 kg)  08/17/13 200 lb 4.8 oz (90.855 kg)  08/17/13 201 lb 6.4 oz (91.354 kg)  08/09/13 208 lb 12.8 oz (94.711 kg)  08/03/13 203 lb (92.08 kg)  08/01/13 206 lb 8 oz (93.668 kg)  07/27/13 205 lb 3.2 oz (93.078 kg)  07/18/13 202 lb (91.627 kg)  06/25/13 200 lb (90.719 kg)    Usual Body Weight: 232 lb per pt  % Usual Body Weight: 83%  BMI:  Body mass index is 25.17 kg/(m^2).  Estimated Nutritional Needs: Kcal: 2200-2400 Protein: 105-125g Fluid: 2.2-2.4L/day  Skin: Intact   Diet Order:  Carb Control  EDUCATION NEEDS: -No education needs identified at this time   Intake/Output Summary (Last 24 hours) at 08/30/13 1131 Last data filed at 08/30/13 0600  Gross per 24 hour  Intake   1750 ml  Output      0 ml  Net   1750 ml    Last BM: 2/12 - loose stool per pt  Labs:   Recent Labs Lab 08/24/13 1418 08/29/13 1530 08/30/13 0635  NA 137 132* 134*  K 4.3 4.1 3.6*  CL  --  93* 96  CO2 $Re'26 23 22  'cMt$ BUN  15.$Remov'8 17 18  'kKCOsK$ CREATININE 0.9 1.14 0.87  CALCIUM 9.7 9.4 9.2  GLUCOSE 141* 140* 186*    CBG (last 3)   Recent Labs  08/29/13 1901 08/29/13 2205 08/30/13 0730  GLUCAP 126* 157* 184*    Scheduled Meds: . azithromycin  500 mg Intravenous Q24H  . dextromethorphan-guaiFENesin  1 tablet Oral BID  . dextromethorphan-guaiFENesin  3 tablet Oral Daily  . dutasteride  0.5 mg Oral Daily  . enoxaparin (LOVENOX) injection  40 mg Subcutaneous Q24H  . insulin aspart  0-5 Units Subcutaneous QHS  . insulin aspart  0-9 Units Subcutaneous TID WC  . multivitamin with minerals  1 tablet Oral Daily  . oxybutynin  10 mg Oral Daily  . predniSONE  60 mg Oral Q breakfast  . saccharomyces boulardii  250 mg Oral BID    Continuous Infusions: . sodium chloride 100 mL/hr at 08/29/13 1500    Past Medical History  Diagnosis Date  . COPD (chronic obstructive pulmonary disease)   . Hyperlipidemia   . Type 2 diabetes mellitus   . Adult bronchiectasis   . Meatal stenosis   . History of atrial fibrillation without current medication     POST OP EPISODE 2004   . AAA (abdominal aortic aneurysm)     LAST DUPLEX ULTRASOUND 06-22-2010  3.7x3.6  . Productive cough   . Dyspnea on exertion   . GERD (gastroesophageal reflux disease)   . History of gastric ulcer   . Foot ulcer, left     BOTTOM OF LITTLE TOE , MONITORED BY PCP  DSG DAILY W/ ANTIBIOTIC OINTMENT AND EPSOM SALT SOAKS  . Arthritis   . Bursitis of shoulder     BOTH  . Frequency of urination   . Urgency of urination   . Nocturia   . Wears glasses   . Bladder cancer     DX  OCT 2014 W/ INVASIVE HIGH GRADE UROTHELIAL BLADDER CARCINOMA (ONCOLOGIST--  DR Alen Blew)    Past Surgical History  Procedure Laterality Date  . Total knee arthroplasty Right 12-05-2002  . Excision right saphenous neuroma/ right knee arthroscopic lysis adhesions and manipulation  05-29-2003  . Shoulder arthroscopy with rotator cuff repair and subacromial decompression Left  03-02-2005    RESECTION OPEN DISTAL CLAVICLE   . Orif compound right femur fx  1970'S    AND RIGHT PATELLECTOMY /  HARDWARE REMOVED LATER  . Transthoracic echocardiogram  12-07-2002    NORMAL LV/ EF 55-65%  . Appendectomy  AGE 58  . Cataract extraction w/ intraocular lens  implant, bilateral    . Retinal detachment surgery Right 1980'S  . Cystoscopy with urethral dilatation N/A 05/14/2013    Procedure: CYSTOSCOPY WITH MEATAL DILATION ;  Surgeon: Bernestine Amass, MD;  Location[facility];  Service: Urology;  Laterality: N/A;  . Circumcision N/A 05/14/2013    Procedure: CIRCUMCISION ADULT;  Surgeon: Shanon Brow  Cy Blamer, MD;  Location: [facility];  Service: Urology;  Laterality: N/A;  . Transurethral resection of bladder tumor N/A 05/14/2013    Procedure: TRANSURETHRAL RESECTION OF BLADDER TUMOR (TURBT);  Surgeon: Bernestine Amass, MD;  Location: [facility];  Service: Urology;  Laterality: N/A;  . Transurethral resection of bladder tumor N/A 06/25/2013    Procedure: REPEAT TRANSURETHRAL RESECTION OF BLADDER TUMOR (TURBT);  Surgeon: Bernestine Amass, MD;  Location: [facility];  Service: Urology;  Laterality: N/A;    Mikey College MS, [place], [place] Pager [phone] After Hours Pager

## 2013-08-30 NOTE — Progress Notes (Signed)
Came to visit patient at bedside. Explained [facility] Care Management services and consents were signed. Explained that [facility] will not interfere or replace home health services. He will receive a post hospital discharge call and will be evaluated for monthly home visits. Left [facility] Care Management packet and contact information at bedside. Appreciative of visit. Made inpatient RNCM aware [facility] Care Management will follow.  Marthenia Rolling, MSN- [place] [phone]

## 2013-08-30 NOTE — Telephone Encounter (Signed)
Spoke w/Ify, RN for pt in rm 1513 re: is pt able to have radiation treatment today. Sammuel Hines stated she "did not why not". She states pt has not had n/v, diarrhea since her shift today, and no c/o pain. She states pt "is still weak". Notified Faith , RT on linac 4 that pt can be treated today per Sammuel Hines, RN. Faith verbalized understanding.

## 2013-08-31 ENCOUNTER — Other Ambulatory Visit: Payer: Medicare HMO

## 2013-08-31 ENCOUNTER — Ambulatory Visit
Admission: RE | Admit: 2013-08-31 | Discharge: 2013-08-31 | Disposition: A | Discharge status: Home / Self Care | Payer: Medicare Other | Source: Ambulatory Visit | Attending: Radiation Oncology | Admitting: Radiation Oncology

## 2013-08-31 ENCOUNTER — Ambulatory Visit: Payer: Medicare HMO | Admitting: Oncology

## 2013-08-31 ENCOUNTER — Ambulatory Visit: Payer: Medicare HMO

## 2013-08-31 ENCOUNTER — Telehealth: Payer: Self-pay | Admitting: Oncology

## 2013-08-31 LAB — GLUCOSE, CAPILLARY
GLUCOSE-CAPILLARY: 118 mg/dL — AB (ref 70–99)
Glucose-Capillary: 317 mg/dL — ABNORMAL HIGH (ref 70–99)
Glucose-Capillary: 219 mg/dL — ABNORMAL HIGH (ref 70–99)

## 2013-08-31 MED ORDER — SACCHAROMYCES BOULARDII 250 MG PO CAPS: 250.0000 mg | ORAL_CAPSULE | Freq: Two times a day (BID) | ORAL | Status: DC

## 2013-08-31 MED ORDER — PREDNISONE 10 MG PO TABS: ORAL_TABLET | ORAL | Status: DC

## 2013-08-31 MED ORDER — BUDESONIDE-FORMOTEROL FUMARATE 80-4.5 MCG/ACT IN AERO: 2.0000 | INHALATION_SPRAY | Freq: Two times a day (BID) | RESPIRATORY_TRACT | Status: DC

## 2013-08-31 MED ORDER — DM-GUAIFENESIN ER 30-600 MG PO TB12: 2.0000 | ORAL_TABLET | Freq: Two times a day (BID) | ORAL | Status: AC

## 2013-08-31 MED ORDER — GLUCERNA SHAKE PO LIQD: 237.0000 mL | Freq: Three times a day (TID) | ORAL | Status: DC

## 2013-08-31 MED ORDER — IPRATROPIUM-ALBUTEROL 0.5-2.5 (3) MG/3ML IN SOLN: 3.0000 mL | Freq: Four times a day (QID) | RESPIRATORY_TRACT | Status: DC | PRN

## 2013-08-31 MED ORDER — AZITHROMYCIN 500 MG PO TABS
500.0000 mg | ORAL_TABLET | Freq: Every evening | ORAL | Status: DC
Start: 2013-08-31 — End: 2013-09-14

## 2013-08-31 NOTE — Progress Notes (Signed)
Met with pt at bedside to discuss [facility] services. Pt politely declined stating he is going to the cancer for radiation treatment M-F and will be seen by the nurses there. He also stated he will be getting 2 more rounds of chenotherapy and after that he will be willing to have the [facility] services set up. Pts nephew who was at the bedside stated pt will have someone with him 24/7 after he goes home. I advised them to have his PCP or oncologist arrange for [facility] services when he feels ready for the services.  Allene Dillon RN BSN   [phone]

## 2013-08-31 NOTE — Progress Notes (Signed)
Discharge instructions given to pt/family, verbalized understanding. Left the unit in stable condition. 

## 2013-08-31 NOTE — Discharge Summary (Signed)
Physician Discharge Summary  Patrick Lane IRJ:[id number] DOB: 11/29/1927 DOA: 08/29/2013  PCP: Donnie Coffin, MD  Admit date: 08/29/2013 Discharge date: 08/31/2013  Recommendations for Outpatient Follow-up:  1. Follow up with Dr. Alen Blew and Dr. Risa Grill for ongoing management of bladder cancer 2. Follow up with PCP for diarrhea, especially if recurrent.  Consider changing metformin to alternative agent if vomiting and diarrhea recur.  F/u GI pathogen panel which is pending.  Started on symbicort in addition to combivent and given azithromycin and prednisone taper.    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   Abnormal chest x-ray   Normocytic anemia   Type 2 diabetes mellitus   COPD exacerbation   Protein-calorie malnutrition, severe   Discharge Condition: stable, improve  Diet recommendation:   Diabetic diet  Thin liquid (to allow pt to choose food items he can manage)  Liquid Administration via: Straw;Cup  Medication Administration: Whole meds with liquid  Supervision: Patient able to self feed  Compensations: Slow rate;Small sips/bites;Follow solids with liquid  Postural Changes and/or Swallow Maneuvers: Seated upright 90 degrees;Upright 30-60 min after meal    Wt Readings from Last 3 Encounters:  08/29/13 87.726 kg (193 lb 6.4 oz)  08/24/13 90.175 kg (198 lb 12.8 oz)  08/17/13 90.855 kg (200 lb 4.8 oz)    History of present illness:  Patrick Lane is an 80 y.o. male with a PMH of COPD managed with Combivent, DM, and bladder cancer status post TURBT 06/25/13, currently receiving XRT and chemotherapy (last given 08/24/13 with carboplatin), who was referred as a direct admission after being evaluated by Dr. Pablo Ledger. The patient has had longstanding intermittent diarrhea, but reports  that it has worsened, with up to 5 loose stools per day, but no melena/hematochezia or associated abdominal cramps. The patient does report recent antibiotic use, but cannot recall which one. He reports an episode of nausea and vomiting today. The patient also has a cough productive of green mucous, but no fever or chills. He has become progressively more weak and fatigued, and has been unsteady on his feet with episodes of fecal incontinence.   Hospital Course:   COPD (chronic obstructive pulmonary disease) with chronic bronchitis and bronchiectasis with acute exacerbation.   Chest x-ray was negative for pneumonia but did show some vague nodularity, see below.  He was initially started on solumedrol which was quickly transitioned to prednisone.  He received duonebs and azithromycin.  He initially had acute hypoxic respiratory failure, but this resolved.  He was given Rx for prednisone taper, symbicort, and azithromycin to continue at home.  He should follow up with his primary care doctor in 1-2 weeks for repeat exam. Home health services have been arranged and he has a 24-h caretaker.      Tree in bud micronodularity on CT:  CXR demonstrated nodularity so CT chest was performed to evaluate for metastatic disease.  CT demonstrated bronchial thickening with subtle tree-in-bud nodularity.  Reviewed with Dr. Alva Garnet from pulmonlogy who felt this is likely secondary to chronic bronchitis and COPD. Feels the pattern is less consistent with MAI. Unlikely to be lymphangitic spread of cancer.  Because the patient described occasional chocking with meals, he underwent swallow evaluation to assess for aspiration.  No obvious aspiration on exam.  See recommendations above.    Bladder cancer per Dr. Risa Grill  -  Continue XRT  - hold chemotherapy and pt to follow in clinic next week per Dr. 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  There has also been some gastroenteritis in this community recently  which may have exacerbated his symptoms.  C. Diff was negative.  GI pathogen panel is pending.  He was given probiotics and imodium, which helped.  He may continue florastor if he chooses.    Leukopenia, likely related to carboplatin.  Per oncology.    Normocytic anemia Likely the sequela of prior chemotherapy. No current indication for transfusion.   Type 2 diabetes mellitus mildly elevated CBG.  Given SSI and may resume metformin.  Advised to talk to PCP about changing to alternative medication if he feels metformin is worsening his diarrhea.    Severe protein calorie malnutrition, continue supplements per nutrition recommendations   Consultants:  Dr. Alva Garnet, Fatima Sanger, via phone who reviewed CT chest Procedures:  CT chest  CXR Antibiotics:  Azithromycin 2/11 >>    Discharge Exam: Filed Vitals:   08/31/13 0543  BP: 127/56  Pulse: 88  Temp: 97.4 F (36.3 C)  Resp: 18   Filed Vitals:   08/30/13 1342 08/30/13 2141 08/31/13 0543 08/31/13 0804  BP: 119/58 111/58 127/56   Pulse: 79 97 88   Temp: 98.6 F (37 C) 97.8 F (36.6 C) 97.4 F (36.3 C)   TempSrc: Oral Oral Oral   Resp: 18 18 18    Height:      Weight:      SpO2: 97% 92% 100% 95%    General: CM, No acute distress  HEENT: NCAT, MMM  Cardiovascular: RRR, nl S1, S2 no mrg, 2+ pulses, warm extremities  Respiratory: Diminished bilateral BS with rhonchi and rales at bases, no wheezes, no increased WOB  Abdomen: NABS, soft, NT/ND  MSK: Normal tone and bulk, no LEE  Neuro: Grossly intact   Discharge Instructions      Discharge Orders   Future Appointments Provider Department Dept Phone   09/03/2013 10:55 AM [facility] [place] [phone]   09/04/2013 10:55 AM [facility] [phone]   09/05/2013 10:55 AM [facility] [phone]   09/06/2013 10:50 AM [facility] [phone]   09/07/2013 11:15 AM [facility] [phone]   09/07/2013 11:45 AM Maryanna Shape, NP [facility] [phone]   09/07/2013 1:05 PM [facility] [phone]   09/10/2013 10:45 AM [facility] [phone]   09/11/2013 10:45 AM [facility] [place] [facility] [phone]   09/12/2013 10:45 AM [facility] [phone]   Future Orders Complete By Expires   Call MD for:  difficulty breathing, headache or visual disturbances  As directed    Call MD for:  extreme fatigue  As directed    Call MD for:  hives  As directed    Call MD for:  persistant dizziness or light-headedness  As directed    Call MD for:  persistant nausea and vomiting  As directed    Call MD for:  severe uncontrolled pain  As directed    Call MD for:  temperature >100.4  As directed    Diet Carb Modified  As directed  Comments:     Regular;Thin liquid (to allow pt to choose food items he can manage)    Liquid Administration via: Straw;Cup  Slow rate of eating;Small sips/bites;Follow solids with liquid Seated upright 90 degrees;Upright 30-60 min after meal   Discharge instructions  As directed    Comments:     You were hospitalized with nausea, vomiting, and diarrhea, which may have been related to your chemotherapy and radiation, however, there has also been some viral gastroenteritis going around [place].  Please continue to drink plenty of fluids to prevent dehydration and use compazine and imodium for your symptoms should they return.  Should this become an ongoing problem, talk to your doctor about changing your metformin to an alternative diabetes medication because metformin can worsen  diarrhea.  For your COPD, you had a CT scan of your chest which showed bronchitis.  You have been given some steroids (prednisone) to take in a tapering dose to reduce inflammation and I have given you a prescription for symbicort to use in addition to your combivent to prevent breathing problems.  Please take azithromycin, an antibiotic, every night, including this evening, to complete a 7-day course of antibiotics.  You have 5 days left, including today.  Your chemotherapy will be rescheduled for you so please call the oncology office [phone] if you have questions.  Please follow up with your primary care doctor to review the final results of your pending tests for diarrhea in about 1-2 weeks.  Return to the hospital if you have worsening dehydration from vomiting and/or diarrhea or if you have difficulty breathing.   Increase activity slowly  As directed        Medication List         AVODART 0.5 MG capsule  Generic drug:  dutasteride  Take 1 tablet by mouth Daily.     azithromycin 500 MG tablet  Commonly known as:  ZITHROMAX  Take 1 tablet (500 mg total) by mouth every evening.     budesonide-formoterol 80-4.5 MCG/ACT inhaler  Commonly known as:  SYMBICORT  Inhale 2 puffs into the lungs 2 (two) times daily.     COMBIVENT RESPIMAT 20-100 MCG/ACT Aers respimat  Generic drug:  Ipratropium-Albuterol  Take 2 puffs by mouth Every 4 hours as needed for wheezing.     dextromethorphan-guaiFENesin 30-600 MG per 12 hr tablet  Commonly known as:  MUCINEX DM  Take 2 tablets by mouth 2 (two) times daily.     feeding supplement (GLUCERNA SHAKE) Liqd  Take 237 mLs by mouth 3 (three) times daily between meals.     HYDROcodone-acetaminophen 5-325 MG per tablet  Commonly known as:  NORCO/VICODIN  Take 1-2 tablets by mouth every 4 (four) hours as needed for moderate pain.     loperamide 2 MG capsule  Commonly known as:  IMODIUM  Take by mouth as needed for diarrhea or loose stools.      metFORMIN 500 MG tablet  Commonly known as:  GLUCOPHAGE  Take 1,000 mg by mouth 2 (two) times daily with a meal.     multivitamin tablet  Take 1 tablet by mouth daily.     oxybutynin 10 MG 24 hr tablet  Commonly known as:  DITROPAN XL  Take 1 tablet (10 mg total) by mouth daily.     predniSONE 10 MG tablet  Commonly known as:  DELTASONE  Take 5 tabs daily x 2 days, 4 tabs daily x 2 days, 3 tabs daily x 2 days, 2  tabs daily x 2 days, 1 tab daily x 2 days, then stop     prochlorperazine 10 MG tablet  Commonly known as:  COMPAZINE  Take 1 tablet (10 mg total) by mouth every 6 (six) hours as needed for nausea or vomiting.     saccharomyces boulardii 250 MG capsule  Commonly known as:  FLORASTOR  Take 1 capsule (250 mg total) by mouth 2 (two) times daily.     traMADol 50 MG tablet  Commonly known as:  ULTRAM  Take by mouth every 6 (six) hours as needed.       Follow-up Information   Follow up with [facility], MD. Schedule an appointment as soon as possible for a visit in 2 weeks.   Specialty:  Family Medicine   Contact information:   [address] [place] [address] [phone]       Follow up with [facility], MD In 1 week.   Specialty:  Oncology   Contact information:   [place] [place] [address] [phone]       Follow up with Bernestine Amass, MD In 1 week.   Specialty:  Urology   Contact information:   [facility]  [place] [address] [phone]        The results of significant diagnostics from this hospitalization (including imaging, microbiology, ancillary and laboratory) are listed below for reference.    Significant Diagnostic Studies: Ct Chest W Contrast  08/30/2013   CLINICAL DATA:  Pneumonia. Abnormal chest x-ray. History of bladder cancer.  EXAM: CT CHEST WITH CONTRAST  TECHNIQUE: Multidetector CT imaging of the chest was performed during intravenous contrast  administration.  CONTRAST:  180mL OMNIPAQUE IOHEXOL 300 MG/ML  SOLN  COMPARISON:  DG CHEST 1V PORT dated 08/29/2013  FINDINGS: Mild emphysema is present in the upper lobes. There is no upper lobe airspace disease. Scattered areas of tree-in-bud micro nodularity or present diffusely in the lower lobes. Additionally, there is bronchial wall thickening compatible with chronic bronchitis. Collapse and some consolidation of both posterior basal lower lobes is present, suggesting chronic infection including atypical such as mycobacterium avium. Mycobacterium avium is commonly associated with the tree in bud pattern although the distribution is somewhat atypical with sparing of the anterior basis. Coronary artery atherosclerosis is present. If office based assessment of coronary risk factors has not been performed, it is now recommended. There is no pericardial effusion. No pleural effusion. Incidental imaging of the upper abdomen is within normal limits. Tiny calcification is present along the superior gastric fundus. Aortic and branch vessel atherosclerosis is present with shaggy mural plaque extending into the upper abdomen. Old granulomatous disease of the liver. Osteopenia is present. No destructive osseous lesions of the thoracic spine. The sternum appears within normal limits.  There is no axillary adenopathy. Bilateral hilar adenopathy is present, probably reactive. Right hilar lymph node measures 16 mm Borna Wessinger axis (image 34 series 2). Left hilar lymph node measures 11 mm Braylinn Gulden axis (image 30 series 2).  Notably, the opacity at the apices on radiography was due in part to projection and extensive costochondral calcification. The only architectural distortion of the pulmonary parenchyma at the apices is due to emphysema.  IMPRESSION: 1. Bilateral lower lobe bronchial wall thickening and tree-in-bud micronodularity. This is most commonly associated with infection but can also be seen in chronic aspiration.  Mycobacterium avium is in the differential considerations although the pattern and distribution is somewhat atypical. 2. Emphysema.  3. Bilateral hilar adenopathy is probably reactive. 4. Atherosclerosis and coronary artery disease.   Electronically Signed   By: Dereck Ligas M.D.   On: 08/30/2013 01:30   Dg Chest Port 1 View  08/29/2013   CLINICAL DATA:  Shortness of breath.  EXAM: PORTABLE CHEST - 1 VIEW  COMPARISON:  DG CHEST 2 VIEW dated 05/14/2013; DG CHEST 2V dated 05/24/2012; CT CHEST W/CM dated 12/07/2002; CT ANGIO CHEST dated 03/05/2005  FINDINGS: Ill-defined nodularity in both upper lobes the. Linear reticular opacities at the lung bases bilaterally. Possible emphysema.  No paratracheal adenopathy observed. Atherosclerotic aortic arch. Subtly blunted left lateral costophrenic angle.  IMPRESSION: 1. Vague nodularity in the upper lobes. I cannot exclude neoplastic pulmonary nodules although much of this may be due to superimposed vascular and osseous structures. Chest CT (with contrast if feasible) is recommended for further characterization. 2. Subtle blunting of the left lateral costophrenic angle suggesting small left pleural effusion. 3. Faint reticular interstitial accentuation in the lung bases, nonspecific but possibly due to low grade inflammation or drug reaction. 4. Atherosclerotic aortic arch. 5. Suspected emphysema. These results will be called to the ordering clinician or representative by the Radiologist Assistant, and communication documented in the PACS Dashboard.   Electronically Signed   By: Sherryl Barters M.D.   On: 08/29/2013 16:35    Microbiology: No results found for this or any previous visit (from the past 240 hour(s)).   Labs: Basic Metabolic Panel:  Recent Labs Lab 08/24/13 1418 08/29/13 1530 08/30/13 0635  NA 137 132* 134*  K 4.3 4.1 3.6*  CL  --  93* 96  CO2 26 23 22   GLUCOSE 141* 140* 186*  BUN 15.8 17 18   CREATININE 0.9 1.14 0.87  CALCIUM 9.7 9.4 9.2    Liver Function Tests:  Recent Labs Lab 08/24/13 1418 08/29/13 1530  AST 14 21  ALT 9 12  ALKPHOS 56 70  BILITOT 1.52* 1.3*  PROT 6.8 7.5  ALBUMIN 3.3* 2.9*   No results found for this basename: LIPASE, AMYLASE,  in the last 168 hours No results found for this basename: AMMONIA,  in the last 168 hours CBC:  Recent Labs Lab 08/24/13 1417 08/29/13 1530 08/30/13 0635  WBC 4.7 2.5* 2.0*  NEUTROABS 3.6  --   --   HGB 11.1* 10.6* 10.3*  HCT 32.7* 29.8* 29.2*  MCV 93.0 90.3 91.5  PLT 130* 160 151   Cardiac Enzymes: No results found for this basename: CKTOTAL, CKMB, CKMBINDEX, TROPONINI,  in the last 168 hours BNP: BNP (last 3 results) No results found for this basename: PROBNP,  in the last 8760 hours CBG:  Recent Labs Lab 08/30/13 1135 08/30/13 1703 08/30/13 2216 08/31/13 0813 08/31/13 1212  GLUCAP 176* 197* 317* 118* 219*    Time coordinating discharge: 45 minutes  Signed:  Cecelia Graciano  [facility] 08/31/2013, 1:36 PM

## 2013-08-31 NOTE — Telephone Encounter (Signed)
lvm for pt regarding to Feb appt....pt will come today for visit

## 2013-08-31 NOTE — Progress Notes (Signed)
[facility] Therapy Treatment Record Phone [phone]   Radiation Therapy was administered to Patrick Lane on: 08/31/2013  11:59 AM and was treatment # 28 out of a planned course of 36 treatments.

## 2013-09-02 ENCOUNTER — Ambulatory Visit: Payer: Medicare Other

## 2013-09-02 LAB — GI PATHOGEN PANEL BY PCR, STOOL
C difficile toxin A/B: NEGATIVE
Campylobacter by PCR: NEGATIVE
Cryptosporidium by PCR: NEGATIVE
E COLI (ETEC) LT/ST: NEGATIVE
E COLI 0157 BY PCR: NEGATIVE
E coli (STEC): NEGATIVE
G lamblia by PCR: NEGATIVE
Norovirus GI/GII: NEGATIVE
Rotavirus A by PCR: NEGATIVE
SALMONELLA BY PCR: NEGATIVE
Shigella by PCR: NEGATIVE

## 2013-09-03 ENCOUNTER — Ambulatory Visit: Payer: Medicare Other

## 2013-09-03 ENCOUNTER — Ambulatory Visit
Admission: RE | Admit: 2013-09-03 | Discharge: 2013-09-03 | Disposition: A | Discharge status: Home / Self Care | Payer: Medicare Other | Source: Ambulatory Visit | Attending: Radiation Oncology | Admitting: Radiation Oncology

## 2013-09-03 ENCOUNTER — Encounter: Payer: Self-pay | Admitting: Radiation Oncology

## 2013-09-03 VITALS — BP 88/48 | HR 120 | Temp 99.0°F | Resp 22 | Wt 193.0 lb

## 2013-09-03 DIAGNOSIS — C679 Malignant neoplasm of bladder, unspecified: Secondary | ICD-10-CM

## 2013-09-03 MED ORDER — DIPHENOXYLATE-ATROPINE 2.5-0.025 MG PO TABS: 1.0000 | ORAL_TABLET | Freq: Four times a day (QID) | ORAL | Status: DC | PRN

## 2013-09-03 MED ORDER — MAGIC MOUTHWASH W/LIDOCAINE: 10.0000 mL | Freq: Four times a day (QID) | ORAL | Status: DC | PRN

## 2013-09-03 NOTE — Progress Notes (Signed)
  Radiation Oncology         [phone] ________________________________  Name: Patrick Lane MRN: [id number]  Date: 09/03/2013  DOB: July 10, 1928  Weekly Radiation Therapy Management  Current Dose: 50.4 Gy     Planned Dose:  64.8 Gy  Narrative . . . . . . . . The patient presents for routine under treatment assessment.                                   The patient is without complaint.  Had diarrhea again today.                                 Set-up films were reviewed.                                 The chart was checked. Physical Findings. . .  weight is 193 lb (87.544 kg). His oral temperature is 99 F (37.2 C). His blood pressure is 88/48 and his pulse is 120. His respiration is 22 and oxygen saturation is 89%. . Weight essentially stable.  No significant changes. Impression . . . . . . . The patient is tolerating radiation. Plan . . . . . . . . . . . . Continue treatment as planned.  ________________________________  Sheral Apley. Tammi Klippel, M.D.

## 2013-09-03 NOTE — Progress Notes (Addendum)
Patient discharged from hospital on Friday. Patient received radiation treatment. Following treatment patient requested to be seen by physician because he "feels so bad." Patient too weak to stand for weight or orthostatic vitals. Sitting patient is hypotensive and tachy. Blood sugar 148. Patient reports he has been forcing fluid and drink two Glucerna's per day. Reports urinary frequency. Reports when he voids he "only passes a spoonful." Reports a productive cough with green sputum. Reports pain across shoulders but, is unable to rate pain . Denies difficult swallowing. Shortness of breath noted. Wet cough heard. Reports an occasional brief headaches. Reports weakness and fatigue. Reports he is unable to rest because of persistent cough. Reports gums are very sore and ulcerations seen on upper and lower lips. Blood sugar 148.

## 2013-09-04 ENCOUNTER — Ambulatory Visit: Payer: Medicare Other

## 2013-09-05 ENCOUNTER — Ambulatory Visit
Admission: RE | Admit: 2013-09-05 | Discharge: 2013-09-05 | Disposition: A | Discharge status: Home / Self Care | Payer: Medicare HMO | Source: Ambulatory Visit | Attending: Radiation Oncology | Admitting: Radiation Oncology

## 2013-09-05 ENCOUNTER — Ambulatory Visit: Payer: Medicare Other

## 2013-09-05 ENCOUNTER — Ambulatory Visit
Admission: RE | Admit: 2013-09-05 | Discharge: 2013-09-05 | Disposition: A | Discharge status: Home / Self Care | Payer: Medicare Other | Source: Ambulatory Visit | Attending: Radiation Oncology | Admitting: Radiation Oncology

## 2013-09-05 ENCOUNTER — Encounter: Payer: Self-pay | Admitting: Radiation Oncology

## 2013-09-05 VITALS — BP 127/58 | HR 101 | Resp 20 | Wt 191.2 lb

## 2013-09-05 DIAGNOSIS — C679 Malignant neoplasm of bladder, unspecified: Secondary | ICD-10-CM | POA: Diagnosis present

## 2013-09-05 MED ORDER — FLUCONAZOLE 100 MG PO TABS: 100.0000 mg | ORAL_TABLET | Freq: Every day | ORAL | Status: DC

## 2013-09-05 NOTE — Progress Notes (Signed)
Improvement in overall condition seen. Weight seems to have stabilized. Reports drinking three cans of ensure per day and eating three meals a day despite decreased appetite. Reports productive cough with green sputum continues but, is less frequent. Reports having one soft stool yesterday thus, lomotil has stopped diarrhea. Sores on upper and lower lip are no better or worse. Thrush noted. Reports he is sleeping better.

## 2013-09-05 NOTE — Progress Notes (Signed)
  Radiation Oncology         [phone] ________________________________  Name: ENIS RIECKE MRN: [id number]  Date: 09/05/2013  DOB: 01/20/1928  Weekly Radiation Therapy Management  Current Dose: 54 Gy     Planned Dose:  64.8 Gy  Narrative . . . . . . . . The patient presents for routine under treatment assessment.                                   The patient is without complaint.                                 Set-up films were reviewed.                                 The chart was checked. Physical Findings. . .  weight is 191 lb 3.2 oz (86.728 kg). His blood pressure is 127/58 and his pulse is 101. His respiration is 20 and oxygen saturation is 91%. Ritta Slot. Weight essentially stable.  No significant changes. Impression . . . . . . . The patient is tolerating radiation. Plan . . . . . . . . . . . . Continue treatment as planned.  Add Diflucan.  ________________________________  Sheral Apley Tammi Klippel, M.D.

## 2013-09-06 ENCOUNTER — Ambulatory Visit: Payer: Medicare Other

## 2013-09-06 ENCOUNTER — Ambulatory Visit
Admission: RE | Admit: 2013-09-06 | Discharge: 2013-09-06 | Disposition: A | Discharge status: Home / Self Care | Payer: Medicare HMO | Source: Ambulatory Visit | Attending: Radiation Oncology | Admitting: Radiation Oncology

## 2013-09-06 DIAGNOSIS — C679 Malignant neoplasm of bladder, unspecified: Secondary | ICD-10-CM | POA: Diagnosis not present

## 2013-09-07 ENCOUNTER — Other Ambulatory Visit (HOSPITAL_BASED_OUTPATIENT_CLINIC_OR_DEPARTMENT_OTHER): Payer: Commercial Managed Care - HMO

## 2013-09-07 ENCOUNTER — Ambulatory Visit (HOSPITAL_BASED_OUTPATIENT_CLINIC_OR_DEPARTMENT_OTHER): Payer: Commercial Managed Care - HMO | Admitting: Oncology

## 2013-09-07 ENCOUNTER — Telehealth: Payer: Self-pay | Admitting: Oncology

## 2013-09-07 ENCOUNTER — Ambulatory Visit
Admission: RE | Admit: 2013-09-07 | Discharge: 2013-09-07 | Disposition: A | Discharge status: Home / Self Care | Payer: Medicare Other | Source: Ambulatory Visit | Attending: Radiation Oncology | Admitting: Radiation Oncology

## 2013-09-07 ENCOUNTER — Encounter: Payer: Self-pay | Admitting: Oncology

## 2013-09-07 VITALS — BP 120/46 | HR 101 | Temp 97.6°F | Resp 18 | Ht 73.0 in | Wt 189.4 lb

## 2013-09-07 DIAGNOSIS — R110 Nausea: Secondary | ICD-10-CM

## 2013-09-07 DIAGNOSIS — C679 Malignant neoplasm of bladder, unspecified: Secondary | ICD-10-CM | POA: Diagnosis not present

## 2013-09-07 DIAGNOSIS — R197 Diarrhea, unspecified: Secondary | ICD-10-CM

## 2013-09-07 DIAGNOSIS — C50919 Malignant neoplasm of unspecified site of unspecified female breast: Secondary | ICD-10-CM

## 2013-09-07 DIAGNOSIS — C779 Secondary and unspecified malignant neoplasm of lymph node, unspecified: Secondary | ICD-10-CM

## 2013-09-07 LAB — CBC WITH DIFFERENTIAL/PLATELET
BASO%: 0.2 % (ref 0.0–2.0)
Basophils Absolute: 0 10*3/uL (ref 0.0–0.1)
EOS%: 0.3 % (ref 0.0–7.0)
Eosinophils Absolute: 0 10*3/uL (ref 0.0–0.5)
HEMATOCRIT: 31.9 % — AB (ref 38.4–49.9)
HGB: 10.6 g/dL — ABNORMAL LOW (ref 13.0–17.1)
LYMPH%: 6.9 % — AB (ref 14.0–49.0)
MCH: 32 pg (ref 27.2–33.4)
MCHC: 33.4 g/dL (ref 32.0–36.0)
MCV: 95.7 fL (ref 79.3–98.0)
MONO#: 0.3 10*3/uL (ref 0.1–0.9)
MONO%: 6.7 % (ref 0.0–14.0)
NEUT#: 4.1 10*3/uL (ref 1.5–6.5)
NEUT%: 85.9 % — AB (ref 39.0–75.0)
PLATELETS: 306 10*3/uL (ref 140–400)
RBC: 3.33 10*6/uL — ABNORMAL LOW (ref 4.20–5.82)
RDW: 17 % — ABNORMAL HIGH (ref 11.0–14.6)
WBC: 4.8 10*3/uL (ref 4.0–10.3)
lymph#: 0.3 10*3/uL — ABNORMAL LOW (ref 0.9–3.3)

## 2013-09-07 LAB — COMPREHENSIVE METABOLIC PANEL (CC13)
ALT: 15 U/L (ref 0–55)
AST: 16 U/L (ref 5–34)
Albumin: 3.1 g/dL — ABNORMAL LOW (ref 3.5–5.0)
Alkaline Phosphatase: 59 U/L (ref 40–150)
Anion Gap: 14 mEq/L — ABNORMAL HIGH (ref 3–11)
BILIRUBIN TOTAL: 0.48 mg/dL (ref 0.20–1.20)
BUN: 26.3 mg/dL — ABNORMAL HIGH (ref 7.0–26.0)
CO2: 25 mEq/L (ref 22–29)
CREATININE: 1.1 mg/dL (ref 0.7–1.3)
Calcium: 10.1 mg/dL (ref 8.4–10.4)
Chloride: 99 mEq/L (ref 98–109)
Glucose: 132 mg/dl (ref 70–140)
Potassium: 4.4 mEq/L (ref 3.5–5.1)
Sodium: 138 mEq/L (ref 136–145)
Total Protein: 7.1 g/dL (ref 6.4–8.3)

## 2013-09-07 NOTE — Telephone Encounter (Signed)
Gave pt appt for lab and MD nutrition , 6 weeks

## 2013-09-07 NOTE — Progress Notes (Signed)
Hematology and Oncology Follow Up Visit  Patrick Lane [id number] January 30, 1928 80 y.o. 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   Prior Therapy: He is status post a repeat TURBT on 06/25/2013 under the care of Dr. Risa Grill.   Current therapy: He started combined modality therapy radiation and chemotherapy on 07/27/2013. Chemotherapy will be given in the form of carboplatin AUC of 2 on a weekly basis with radiation.  Interim History:  Patrick Lane presents today for a followup visit. He is a pleasant gentleman with the above diagnosis presents today for evaluation before the next infusion of chemotherapy. Since his last visit, he was hospitalized for a COPD exacerbation. He reports that he is feeling better now. Denies fevers, cough, SOB.  He has not reported any nausea, vomiting or any GI complications. He does report some loose bowel movements. Using Imodium or Lomotil which is helping. He has not reported any neuropathy or infusion-related problems for carboplatin. He has lost 10 lbs since his last visit. Drinks 3 cans of Ensure or Boost daily.   Medications: I have reviewed the patient's current medications.  Current Outpatient Prescriptions  Medication Sig Dispense Refill  . Alum & Mag Hydroxide-Simeth (MAGIC MOUTHWASH W/LIDOCAINE) SOLN Take 10 mLs by mouth 4 (four) times daily as needed for mouth pain.  240 mL  5  . AVODART 0.5 MG capsule Take 1 tablet by mouth Daily.      Marland Kitchen azithromycin (ZITHROMAX) 500 MG tablet Take 1 tablet (500 mg total) by mouth every evening.  5 tablet  0  . budesonide-formoterol (SYMBICORT) 80-4.5 MCG/ACT inhaler Inhale 2 puffs into the lungs 2 (two) times daily.  1 Inhaler  0  . COMBIVENT RESPIMAT 20-100 MCG/ACT AERS Take 2 puffs by mouth Every 4 hours as needed for wheezing.       Marland Kitchen  dextromethorphan-guaiFENesin (MUCINEX DM) 30-600 MG per 12 hr tablet Take 2 tablets by mouth 2 (two) times daily.  120 tablet  0  . diphenoxylate-atropine (LOMOTIL) 2.5-0.025 MG per tablet Take 1-2 tablets by mouth 4 (four) times daily as needed for diarrhea or loose stools.  60 tablet  0  . feeding supplement, GLUCERNA SHAKE, (GLUCERNA SHAKE) LIQD Take 237 mLs by mouth 3 (three) times daily between meals.  90 Can  0  . fluconazole (DIFLUCAN) 100 MG tablet Take 1 tablet (100 mg total) by mouth daily. Take 2 the first day, then once daily for total of 7 days  8 tablet  0  . HYDROcodone-acetaminophen (NORCO/VICODIN) 5-325 MG per tablet Take 1-2 tablets by mouth every 4 (four) hours as needed for moderate pain.      . metFORMIN (GLUCOPHAGE) 500 MG tablet Take 1,000 mg by mouth 2 (two) times daily with a meal.      . Multiple Vitamin (MULTIVITAMIN) tablet Take 1 tablet by mouth daily.      Marland Kitchen oxybutynin (DITROPAN XL) 10 MG 24 hr tablet Take 1 tablet (10 mg total) by mouth daily.  30 tablet  0  . predniSONE (DELTASONE) 10 MG tablet Take 5 tabs daily x 2 days, 4 tabs daily x 2 days, 3 tabs daily x 2 days, 2 tabs daily x 2 days, 1 tab daily x 2 days, then stop  30 tablet  0  . prochlorperazine (COMPAZINE) 10 MG tablet Take 1 tablet (10 mg total)  by mouth every 6 (six) hours as needed for nausea or vomiting.  30 tablet  0  . saccharomyces boulardii (FLORASTOR) 250 MG capsule Take 1 capsule (250 mg total) by mouth 2 (two) times daily.  60 capsule  0  . loperamide (IMODIUM) 2 MG capsule Take by mouth as needed for diarrhea or loose stools.      . traMADol (ULTRAM) 50 MG tablet Take by mouth every 6 (six) hours as needed.       No current facility-administered medications for this visit.     Allergies:  Allergies  Allergen Reactions  . Celebrex [Celecoxib] Hives    Past Medical History, Surgical history, Social history, and Family History were reviewed and updated.  Review of Systems: Constitutional:   Negative for fever, chills, night sweats, anorexia, weight loss, pain.  Remaining ROS negative. Physical Exam: Blood pressure 120/46, pulse 101, temperature 97.6 F (36.4 C), resp. rate 18, height 6\' 1"  (1.854 m), weight 189 lb 6.4 oz (85.911 kg), SpO2 94.00%. ECOG: 1 General appearance: alert, cooperative and appears stated age Head: Normocephalic, without obvious abnormality, atraumatic Neck: no adenopathy, no carotid bruit, no JVD, supple, symmetrical, trachea midline and thyroid not enlarged, symmetric, no tenderness/mass/nodules Lymph nodes: Cervical, supraclavicular, and axillary nodes normal. Heart:regular rate and rhythm, S1, S2 normal, no murmur, click, rub or gallop Lung:chest clear, no wheezing, rales, normal symmetric air entry Abdomen: soft, non-tender, without masses or organomegaly EXT:no erythema, induration, or nodules   Lab Results: Lab Results  Component Value Date   WBC 4.8 09/07/2013   HGB 10.6* 09/07/2013   HCT 31.9* 09/07/2013   MCV 95.7 09/07/2013   PLT 306 09/07/2013     Chemistry      Component Value Date/Time   NA 138 09/07/2013 1117   NA 134* 08/30/2013 0635   K 4.4 09/07/2013 1117   K 3.6* 08/30/2013 0635   CL 96 08/30/2013 0635   CO2 25 09/07/2013 1117   CO2 22 08/30/2013 0635   BUN 26.3* 09/07/2013 1117   BUN 18 08/30/2013 0635   CREATININE 1.1 09/07/2013 1117   CREATININE 0.87 08/30/2013 0635      Component Value Date/Time   CALCIUM 10.1 09/07/2013 1117   CALCIUM 9.2 08/30/2013 0635   ALKPHOS 59 09/07/2013 1117   ALKPHOS 70 08/29/2013 1530   AST 16 09/07/2013 1117   AST 21 08/29/2013 1530   ALT 15 09/07/2013 1117   ALT 12 08/29/2013 1530   BILITOT 0.48 09/07/2013 1117   BILITOT 1.3* 08/29/2013 1530      Impression and Plan:  This is an 80 year old gentleman with the following issues:  1. 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 He is not a candidate for cystectomy and will  receive combined modality with radiation therapy and chemotherapy. He is on weekly carboplatin and with radiation therapy. Chemo was held last week due to COPD exacerbation. Will hold off on chemo at this point. Continue XRT per Dr. Tammi Klippel.  2. Nausea prophylaxis: He has Compazine at home. 3. Diarrhea:Continue Imodium and Lomotil.  4. Followup. In 6 weeks.   [name] 2/20/20151:27 PM

## 2013-09-10 ENCOUNTER — Ambulatory Visit
Admission: RE | Admit: 2013-09-10 | Discharge: 2013-09-10 | Disposition: A | Discharge status: Home / Self Care | Payer: Medicare HMO | Source: Ambulatory Visit | Attending: Radiation Oncology | Admitting: Radiation Oncology

## 2013-09-10 ENCOUNTER — Encounter: Payer: Self-pay | Admitting: Radiation Oncology

## 2013-09-10 VITALS — BP 104/63 | HR 122 | Temp 98.5°F | Resp 20 | Wt 187.2 lb

## 2013-09-10 DIAGNOSIS — C679 Malignant neoplasm of bladder, unspecified: Secondary | ICD-10-CM | POA: Diagnosis not present

## 2013-09-10 NOTE — Progress Notes (Signed)
  Radiation Oncology         [phone] ________________________________  Name: Patrick Lane MRN: [id number]  Date: 09/10/2013  DOB: April 18, 1928  Weekly Radiation Therapy Management  Current Dose: 59.4 Gy     Planned Dose:  63.8 Gy  Narrative . . . . . . . . The patient presents for routine under treatment assessment.  Reports frequent productive cough with scant green sputum. Shortness of breath with mild exertion noted. Denies difficulty swallowing. Reports dysuria. Reports urinary incontinence. Reports diarrhea has resolved. No thrush noted. Reports that he is very sleepy                                   The patient is without complaint.                                 Set-up films were reviewed.                                 The chart was checked. Physical Findings. . .  weight is 187 lb 3.2 oz (84.913 kg). His oral temperature is 98.5 F (36.9 C). His blood pressure is 104/63 and his pulse is 122. His respiration is 20 and oxygen saturation is 95%. . Weight essentially stable.  No significant changes. Impression . . . . . . . The patient is tolerating radiation. Plan . . . . . . . . . . . . Continue treatment as planned.  ________________________________  Sheral Apley. Tammi Klippel, M.D.

## 2013-09-10 NOTE — Progress Notes (Signed)
Reports frequent productive cough with scant green sputum. Shortness of breath with mild exertion noted. Denies difficulty swallowing. Reports dysuria. Reports urinary incontinence. Reports diarrhea has resolved. No thrush noted. Reports that he is very sleepy.

## 2013-09-11 ENCOUNTER — Telehealth: Payer: Self-pay | Admitting: Dietician

## 2013-09-11 ENCOUNTER — Ambulatory Visit
Admission: RE | Admit: 2013-09-11 | Discharge: 2013-09-11 | Disposition: A | Discharge status: Home / Self Care | Payer: Medicare Other | Source: Ambulatory Visit | Attending: Radiation Oncology | Admitting: Radiation Oncology

## 2013-09-11 DIAGNOSIS — C679 Malignant neoplasm of bladder, unspecified: Secondary | ICD-10-CM | POA: Diagnosis not present

## 2013-09-11 NOTE — Telephone Encounter (Signed)
Brief Outpatient Oncology Nutrition Note  Patient has been identified to be at risk on malnutrition screen.  Wt Readings from Last 10 Encounters:  09/10/13 187 lb 3.2 oz (84.913 kg)  09/07/13 189 lb 6.4 oz (85.911 kg)  09/05/13 191 lb 3.2 oz (86.728 kg)  09/03/13 193 lb (87.544 kg)  08/29/13 193 lb 6.4 oz (87.726 kg)  08/24/13 198 lb 12.8 oz (90.175 kg)  08/17/13 200 lb 4.8 oz (90.855 kg)  08/17/13 201 lb 6.4 oz (91.354 kg)  08/09/13 208 lb 12.8 oz (94.711 kg)  08/03/13 203 lb (92.08 kg)    Dx:  Muscle invasive transitional cell carcinoma of the bladder undergoing chemo and radiation.  Called patient secondary to weight loss.  Patient states that he tries to eat well and drinks 3-4 Glucerna shakes daily.  Problems with diarrhea at times.    Will mail patient handouts on nutrition and diarrhea, and tips to increase calories and protein.  Will also provide coupons for Glucerna and contact information for the [facility].  Antonieta Iba, RD, LDN

## 2013-09-12 ENCOUNTER — Ambulatory Visit
Admission: RE | Admit: 2013-09-12 | Discharge: 2013-09-12 | Disposition: A | Discharge status: Home / Self Care | Payer: Medicare Other | Source: Ambulatory Visit | Attending: Radiation Oncology | Admitting: Radiation Oncology

## 2013-09-12 ENCOUNTER — Ambulatory Visit: Payer: Medicare Other

## 2013-09-12 DIAGNOSIS — C679 Malignant neoplasm of bladder, unspecified: Secondary | ICD-10-CM | POA: Diagnosis not present

## 2013-09-13 ENCOUNTER — Ambulatory Visit: Payer: Medicare Other

## 2013-09-14 ENCOUNTER — Ambulatory Visit
Admission: RE | Admit: 2013-09-14 | Discharge: 2013-09-14 | Disposition: A | Discharge status: Home / Self Care | Payer: Medicare HMO | Source: Ambulatory Visit | Attending: Radiation Oncology | Admitting: Radiation Oncology

## 2013-09-14 ENCOUNTER — Encounter: Payer: Self-pay | Admitting: Radiation Oncology

## 2013-09-14 VITALS — BP 126/87 | HR 116 | Temp 97.7°F | Resp 20 | Wt 188.8 lb

## 2013-09-14 DIAGNOSIS — C679 Malignant neoplasm of bladder, unspecified: Secondary | ICD-10-CM | POA: Diagnosis not present

## 2013-09-14 MED ORDER — OXYBUTYNIN CHLORIDE ER 10 MG PO TB24: 10.0000 mg | ORAL_TABLET | Freq: Every day | ORAL | Status: DC

## 2013-09-14 MED ORDER — HYDROCODONE-ACETAMINOPHEN 5-325 MG PO TABS: 1.0000 | ORAL_TABLET | Freq: Four times a day (QID) | ORAL | Status: DC | PRN

## 2013-09-14 NOTE — Progress Notes (Signed)
Pt completed treatment today to bladder. Pt is incontinent of urine, reports occasional dysuria. Pt c/o pain in his shoulders and right knee. He states this is chronic pain, has history of right knee surgery. Pt inquiring about pain medication, states he no longer has Tramadol or Hydrocodone. Last diarrhea 2 days ago, states well controlled with medication. Pt c/o fatigue, weakness, loss of appetite.  Pt has 1 month FU scheduled.

## 2013-09-15 ENCOUNTER — Encounter: Payer: Self-pay | Admitting: Radiation Oncology

## 2013-09-15 NOTE — Progress Notes (Signed)
  Radiation Oncology         [phone] ________________________________  Name: Patrick Lane MRN: [id number]  Date: 09/14/2013  DOB: [birth year]  Weekly Radiation Therapy Management  Current Dose: 64.8 Gy     Planned Dose:  64.8 Gy  Narrative . . . . . . . . The patient presents for the final under treatment assessment.                                     The patient has had some continuation of previously noted symptoms.  Pt completed treatment today to bladder. Pt is incontinent of urine, reports occasional dysuria. Pt c/o pain in his shoulders and right knee. He states this is chronic pain, has history of right knee surgery. Pt inquiring about pain medication, states he no longer has Tramadol or Hydrocodone. Last diarrhea 2 days ago, states well controlled with medication. Pt c/o fatigue, weakness, loss of appetite.                                  Set-up films were reviewed.                                 The chart was checked. Physical Findings. . . Weight essentially stable.  No significant changes. Impression . . . . . . . The patient tolerated radiation relatively well. Plan . . . . . . . . . . . . Complete radiation today as scheduled, and follow-up in one month. The patient was encouraged to call or return to the clinic in the interim for any worsening symptoms.  ________________________________  Sheral Apley Tammi Klippel, M.D.

## 2013-09-15 NOTE — Progress Notes (Signed)
  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  The entire bladder and pelvic lymph nodes were initially treated to 45 gray in 25 fractions of 1.8 gray 2.  The primary tumor site was boosted to 64.8 gray with 11 additional fractions of 1.8 gray  Beams/energy:    1.  The entire bladder and pelvic lymph nodes were initially treated using a body fix immobilization bag for reproducible leg positioning. The patient underwent daily image guidance with megavoltage CT for set up corrections based on his anatomy that day. The initial treatment was carried out with an empty bladder. 2.  The primary tumor site was boosted using a body fix immobilization bag for reproducible leg positioning. The patient underwent daily image guidance with megavoltage CT for set up corrections based on his anatomy that day. The initial treatment was carried out with a full bladder.  Narrative: The patient tolerated radiation treatment relatively poorly. He continued to suffer with urinary frequency of small volumes. He also suffered with relatively severe bouts of diarrhea during his first 4 weeks of radiotherapy. Ultimately, this was controlled when he started Lomotil and this also corresponded to his field reduction after 45 gray. The patient had poor by mouth intake through radiation treatment and was hospitalized for hydration on 08/29/2013 through 08/31/2013.  Plan: The patient has completed radiation treatment. The patient will return to radiation oncology clinic for routine followup in one month. I advised them to call or return sooner if they have any questions or  concerns related to their recovery or treatment. ________________________________  Sheral Apley. Tammi Klippel, M.D.

## 2013-09-22 ENCOUNTER — Emergency Department (HOSPITAL_COMMUNITY): Payer: Medicare HMO

## 2013-09-22 ENCOUNTER — Encounter (HOSPITAL_COMMUNITY): Payer: Self-pay | Admitting: Emergency Medicine

## 2013-09-22 ENCOUNTER — Inpatient Hospital Stay (HOSPITAL_COMMUNITY)
Admission: EM | Admit: 2013-09-22 | Discharge: 2013-09-27 | DRG: 189 | Disposition: A | Discharge status: Home Health | Payer: Medicare HMO | Attending: Internal Medicine | Admitting: Internal Medicine

## 2013-09-22 DIAGNOSIS — Z860100 Personal history of colon polyps, unspecified: Secondary | ICD-10-CM

## 2013-09-22 DIAGNOSIS — Z87891 Personal history of nicotine dependence: Secondary | ICD-10-CM

## 2013-09-22 DIAGNOSIS — D649 Anemia, unspecified: Secondary | ICD-10-CM | POA: Diagnosis present

## 2013-09-22 DIAGNOSIS — I714 Abdominal aortic aneurysm, without rupture, unspecified: Secondary | ICD-10-CM | POA: Diagnosis present

## 2013-09-22 DIAGNOSIS — R9389 Abnormal findings on diagnostic imaging of other specified body structures: Secondary | ICD-10-CM

## 2013-09-22 DIAGNOSIS — Z8711 Personal history of peptic ulcer disease: Secondary | ICD-10-CM

## 2013-09-22 DIAGNOSIS — C679 Malignant neoplasm of bladder, unspecified: Secondary | ICD-10-CM

## 2013-09-22 DIAGNOSIS — E440 Moderate protein-calorie malnutrition: Secondary | ICD-10-CM | POA: Insufficient documentation

## 2013-09-22 DIAGNOSIS — K219 Gastro-esophageal reflux disease without esophagitis: Secondary | ICD-10-CM | POA: Diagnosis present

## 2013-09-22 DIAGNOSIS — K644 Residual hemorrhoidal skin tags: Secondary | ICD-10-CM | POA: Diagnosis present

## 2013-09-22 DIAGNOSIS — R071 Chest pain on breathing: Secondary | ICD-10-CM | POA: Diagnosis present

## 2013-09-22 DIAGNOSIS — J471 Bronchiectasis with (acute) exacerbation: Secondary | ICD-10-CM | POA: Diagnosis present

## 2013-09-22 DIAGNOSIS — R195 Other fecal abnormalities: Secondary | ICD-10-CM | POA: Diagnosis not present

## 2013-09-22 DIAGNOSIS — Z923 Personal history of irradiation: Secondary | ICD-10-CM

## 2013-09-22 DIAGNOSIS — Z8249 Family history of ischemic heart disease and other diseases of the circulatory system: Secondary | ICD-10-CM

## 2013-09-22 DIAGNOSIS — E785 Hyperlipidemia, unspecified: Secondary | ICD-10-CM | POA: Diagnosis present

## 2013-09-22 DIAGNOSIS — J9600 Acute respiratory failure, unspecified whether with hypoxia or hypercapnia: Principal | ICD-10-CM | POA: Diagnosis present

## 2013-09-22 DIAGNOSIS — Z96659 Presence of unspecified artificial knee joint: Secondary | ICD-10-CM

## 2013-09-22 DIAGNOSIS — E119 Type 2 diabetes mellitus without complications: Secondary | ICD-10-CM | POA: Diagnosis present

## 2013-09-22 DIAGNOSIS — Z79899 Other long term (current) drug therapy: Secondary | ICD-10-CM

## 2013-09-22 DIAGNOSIS — IMO0002 Reserved for concepts with insufficient information to code with codable children: Secondary | ICD-10-CM

## 2013-09-22 DIAGNOSIS — J441 Chronic obstructive pulmonary disease with (acute) exacerbation: Secondary | ICD-10-CM | POA: Diagnosis present

## 2013-09-22 DIAGNOSIS — E43 Unspecified severe protein-calorie malnutrition: Secondary | ICD-10-CM | POA: Diagnosis present

## 2013-09-22 DIAGNOSIS — J449 Chronic obstructive pulmonary disease, unspecified: Secondary | ICD-10-CM

## 2013-09-22 DIAGNOSIS — D638 Anemia in other chronic diseases classified elsewhere: Secondary | ICD-10-CM | POA: Diagnosis present

## 2013-09-22 DIAGNOSIS — D500 Iron deficiency anemia secondary to blood loss (chronic): Secondary | ICD-10-CM | POA: Diagnosis present

## 2013-09-22 DIAGNOSIS — C672 Malignant neoplasm of lateral wall of bladder: Secondary | ICD-10-CM | POA: Diagnosis present

## 2013-09-22 LAB — I-STAT TROPONIN, ED: Troponin i, poc: 0.01 ng/mL (ref 0.00–0.08)

## 2013-09-22 LAB — CBC WITH DIFFERENTIAL/PLATELET
BASOS PCT: 1 % (ref 0–1)
Basophils Absolute: 0 10*3/uL (ref 0.0–0.1)
EOS ABS: 0.1 10*3/uL (ref 0.0–0.7)
Eosinophils Relative: 2 % (ref 0–5)
HCT: 26.6 % — ABNORMAL LOW (ref 39.0–52.0)
HEMOGLOBIN: 8.7 g/dL — AB (ref 13.0–17.0)
Lymphocytes Relative: 23 % (ref 12–46)
Lymphs Abs: 1 10*3/uL (ref 0.7–4.0)
MCH: 31.6 pg (ref 26.0–34.0)
MCHC: 32.7 g/dL (ref 30.0–36.0)
MCV: 96.7 fL (ref 78.0–100.0)
MONOS PCT: 14 % — AB (ref 3–12)
Monocytes Absolute: 0.6 10*3/uL (ref 0.1–1.0)
NEUTROS ABS: 2.7 10*3/uL (ref 1.7–7.7)
NEUTROS PCT: 62 % (ref 43–77)
Platelets: 307 10*3/uL (ref 150–400)
RBC: 2.75 MIL/uL — AB (ref 4.22–5.81)
RDW: 19.1 % — ABNORMAL HIGH (ref 11.5–15.5)
WBC: 4.4 10*3/uL (ref 4.0–10.5)

## 2013-09-22 LAB — BASIC METABOLIC PANEL
BUN: 14 mg/dL (ref 6–23)
CHLORIDE: 95 meq/L — AB (ref 96–112)
CO2: 25 meq/L (ref 19–32)
Calcium: 9.7 mg/dL (ref 8.4–10.5)
Creatinine, Ser: 0.87 mg/dL (ref 0.50–1.35)
GFR calc Af Amer: 90 mL/min — ABNORMAL LOW (ref >90)
GFR calc non Af Amer: 78 mL/min — ABNORMAL LOW (ref >90)
Glucose, Bld: 129 mg/dL — ABNORMAL HIGH (ref 70–99)
Potassium: 4.5 mEq/L (ref 3.7–5.3)
SODIUM: 137 meq/L (ref 137–147)

## 2013-09-22 MED ORDER — IPRATROPIUM-ALBUTEROL 0.5-2.5 (3) MG/3ML IN SOLN
5.0000 mL | Freq: Once | RESPIRATORY_TRACT | Status: AC
  Administered 2013-09-22: 5 mL via RESPIRATORY_TRACT
  Filled 2013-09-22: qty 3

## 2013-09-22 MED ORDER — IPRATROPIUM-ALBUTEROL 0.5-2.5 (3) MG/3ML IN SOLN
5.0000 mL | Freq: Once | RESPIRATORY_TRACT | Status: AC
  Administered 2013-09-22: 5 mL via RESPIRATORY_TRACT
  Filled 2013-09-22: qty 6

## 2013-09-22 NOTE — ED Notes (Signed)
MD at bedside. 

## 2013-09-22 NOTE — ED Notes (Signed)
PA at bedside.

## 2013-09-22 NOTE — ED Provider Notes (Signed)
Medical screening examination/treatment/procedure(s) were conducted as a shared visit with non-physician practitioner(s) and myself.  I personally evaluated the patient during the encounter.   EKG Interpretation   Date/Time:  Saturday September 22 2013 20:32:10 EST Ventricular Rate:  115 PR Interval:  158 QRS Duration: 136 QT Interval:  350 QTC Calculation: 484 R Axis:   -82 Text Interpretation:  Sinus tachycardia RBBB and LAFB ST elevation,  consider inferior injury No significant change since last tracing  Confirmed by Oaklan Persons  MD, [place] [phone]) on 09/22/2013 8:45:24 PM     Results for orders placed during the hospital encounter of 62/13/08  BASIC METABOLIC PANEL      Result Value Ref Range   Sodium 137  137 - 147 mEq/L   Potassium 4.5  3.7 - 5.3 mEq/L   Chloride 95 (*) 96 - 112 mEq/L   CO2 25  19 - 32 mEq/L   Glucose, Bld 129 (*) 70 - 99 mg/dL   BUN 14  6 - 23 mg/dL   Creatinine, Ser 0.87  0.50 - 1.35 mg/dL   Calcium 9.7  8.4 - 10.5 mg/dL   GFR calc non Af Amer 78 (*) >90 mL/min   GFR calc Af Amer 90 (*) >90 mL/min  CBC WITH DIFFERENTIAL      Result Value Ref Range   WBC 4.4  4.0 - 10.5 K/uL   RBC 2.75 (*) 4.22 - 5.81 MIL/uL   Hemoglobin 8.7 (*) 13.0 - 17.0 g/dL   HCT 26.6 (*) 39.0 - 52.0 %   MCV 96.7  78.0 - 100.0 fL   MCH 31.6  26.0 - 34.0 pg   MCHC 32.7  30.0 - 36.0 g/dL   RDW 19.1 (*) 11.5 - 15.5 %   Platelets 307  150 - 400 K/uL   Neutrophils Relative % 62  43 - 77 %   Neutro Abs 2.7  1.7 - 7.7 K/uL   Lymphocytes Relative 23  12 - 46 %   Lymphs Abs 1.0  0.7 - 4.0 K/uL   Monocytes Relative 14 (*) 3 - 12 %   Monocytes Absolute 0.6  0.1 - 1.0 K/uL   Eosinophils Relative 2  0 - 5 %   Eosinophils Absolute 0.1  0.0 - 0.7 K/uL   Basophils Relative 1  0 - 1 %   Basophils Absolute 0.0  0.0 - 0.1 K/uL  I-STAT TROPOININ, ED      Result Value Ref Range   Troponin i, poc 0.01  0.00 - 0.08 ng/mL   Comment 3            Dg Chest 2 View  09/22/2013   CLINICAL DATA:  Cough  and congestion, history of COPD and bladder malignancy and diabetes  EXAM: CHEST  2 VIEW  COMPARISON:  Portable chest x-ray and chest CT scan of August 29, 2013.  FINDINGS: The lungs are mildly hyperinflated. There is no focal infiltrate. Coarse increased lung markings in the lower lobes posteriorly are present and likely reflect bronchiectasis as seen on the previous CT scan. The cardiopericardial silhouette is top-normal in size. The pulmonary vascularity is not engorged. There is no pleural effusion. The mediastinum is normal in width. There is mild tortuosity of the descending thoracic aorta. The observed portions of the bony thorax exhibit no acute abnormalities.  IMPRESSION: There is hyperinflation consistent with COPD. There is no alveolar pneumonia. Lower lobe increased density is not new and is consistent with bronchiectasis. There is no evidence of CHF.  Electronically Signed   By: David  Martinique   On: 09/22/2013 20:56   Ct Chest W Contrast  08/30/2013   CLINICAL DATA:  Pneumonia. Abnormal chest x-ray. History of bladder cancer.  EXAM: CT CHEST WITH CONTRAST  TECHNIQUE: Multidetector CT imaging of the chest was performed during intravenous contrast administration.  CONTRAST:  143mL OMNIPAQUE IOHEXOL 300 MG/ML  SOLN  COMPARISON:  DG CHEST 1V PORT dated 08/29/2013  FINDINGS: Mild emphysema is present in the upper lobes. There is no upper lobe airspace disease. Scattered areas of tree-in-bud micro nodularity or present diffusely in the lower lobes. Additionally, there is bronchial wall thickening compatible with chronic bronchitis. Collapse and some consolidation of both posterior basal lower lobes is present, suggesting chronic infection including atypical such as mycobacterium avium. Mycobacterium avium is commonly associated with the tree in bud pattern although the distribution is somewhat atypical with sparing of the anterior basis. Coronary artery atherosclerosis is present. If office based  assessment of coronary risk factors has not been performed, it is now recommended. There is no pericardial effusion. No pleural effusion. Incidental imaging of the upper abdomen is within normal limits. Tiny calcification is present along the superior gastric fundus. Aortic and branch vessel atherosclerosis is present with shaggy mural plaque extending into the upper abdomen. Old granulomatous disease of the liver. Osteopenia is present. No destructive osseous lesions of the thoracic spine. The sternum appears within normal limits.  There is no axillary adenopathy. Bilateral hilar adenopathy is present, probably reactive. Right hilar lymph node measures 16 mm short axis (image 34 series 2). Left hilar lymph node measures 11 mm short axis (image 30 series 2).  Notably, the opacity at the apices on radiography was due in part to projection and extensive costochondral calcification. The only architectural distortion of the pulmonary parenchyma at the apices is due to emphysema.  IMPRESSION: 1. Bilateral lower lobe bronchial wall thickening and tree-in-bud micronodularity. This is most commonly associated with infection but can also be seen in chronic aspiration. Mycobacterium avium is in the differential considerations although the pattern and distribution is somewhat atypical. 2. Emphysema. 3. Bilateral hilar adenopathy is probably reactive. 4. Atherosclerosis and coronary artery disease.   Electronically Signed   By: Dereck Ligas M.D.   On: 08/30/2013 01:30   Dg Chest Port 1 View  08/29/2013   CLINICAL DATA:  Shortness of breath.  EXAM: PORTABLE CHEST - 1 VIEW  COMPARISON:  DG CHEST 2 VIEW dated 05/14/2013; DG CHEST 2V dated 05/24/2012; CT CHEST W/CM dated 12/07/2002; CT ANGIO CHEST dated 03/05/2005  FINDINGS: Ill-defined nodularity in both upper lobes the. Linear reticular opacities at the lung bases bilaterally. Possible emphysema.  No paratracheal adenopathy observed. Atherosclerotic aortic arch. Subtly blunted  left lateral costophrenic angle.  IMPRESSION: 1. Vague nodularity in the upper lobes. I cannot exclude neoplastic pulmonary nodules although much of this may be due to superimposed vascular and osseous structures. Chest CT (with contrast if feasible) is recommended for further characterization. 2. Subtle blunting of the left lateral costophrenic angle suggesting small left pleural effusion. 3. Faint reticular interstitial accentuation in the lung bases, nonspecific but possibly due to low grade inflammation or drug reaction. 4. Atherosclerotic aortic arch. 5. Suspected emphysema. These results will be called to the ordering clinician or representative by the Radiologist Assistant, and communication documented in the PACS Dashboard.   Electronically Signed   By: Sherryl Barters M.D.   On: 08/29/2013 16:35    Patient currently has an oxygen requirement.  On the 2 L easily satting 91% this is most likely exacerbation of COPD which is known to have. Patient's troponin was negative symptoms are really not consistent with coronary artery disease or acute cardiac chest pain. Patient only has chest discomfort with cough. Cough has been productive. Patient's chest x-rays negative for pneumonia or congestive heart failure or pneumothorax. Patient will most likely require admission for the COPD exacerbation. Following 1.duo neb patient without significant improvement. We'll patient will be treated with some steroids second do an abdomen was likely will require admission.  Mervin Kung, MD 09/22/13 2219

## 2013-09-22 NOTE — ED Provider Notes (Signed)
CSN: [id number]     Arrival date & time 09/22/13  2014 History   First MD Initiated Contact with Patient 09/22/13 2027     Chief Complaint  Patient presents with  . Chest Pain  . Cough     (Consider location/radiation/quality/duration/timing/severity/associated sxs/prior Treatment) HPI Comments: Patrick Lane is an 80 y.o. male with a PMH of COPD managed with Combivent, DM, and bladder cancer( status post TURBT 06/25/13, history of radaition chemotherapy (last given 08/24/13 with carboplatin), Presents to the emergency department due to chest discomfort and productive cough. He reports chest discomfort with coughing. He reports dyspnea at rest, increases with exertion. The patient states he is not on home oxygen. He is unsure when he last used his Combivent. He denies fever or chills.  He denies palpitations, leg swelling, abdominal pain.  He denies a history of BRBPR, melenotic stools, hematemesis, epistaxis, hematuria. PCP: Lupe Carney, MD   Patient is a 80 y.o. male presenting with chest pain and cough. The history is provided by the patient, a relative and medical records. No language interpreter was used.  Chest Pain Associated symptoms: cough and shortness of breath   Associated symptoms: no abdominal pain, no fever, no nausea, no palpitations and not vomiting   Cough Associated symptoms: chest pain, shortness of breath and wheezing   Associated symptoms: no chills and no fever     Past Medical History  Diagnosis Date  . COPD (chronic obstructive pulmonary disease)   . Hyperlipidemia   . Type 2 diabetes mellitus   . Adult bronchiectasis   . Meatal stenosis   . History of atrial fibrillation without current medication     POST OP EPISODE 2004   . AAA (abdominal aortic aneurysm)     LAST DUPLEX ULTRASOUND 06-22-2010  3.7x3.6  . Productive cough   . Dyspnea on exertion   . GERD (gastroesophageal reflux disease)   . History of gastric ulcer   . Foot ulcer, left     BOTTOM  OF LITTLE TOE , MONITORED BY PCP  DSG DAILY W/ ANTIBIOTIC OINTMENT AND EPSOM SALT SOAKS  . Arthritis   . Bursitis of shoulder     BOTH  . Frequency of urination   . Urgency of urination   . Nocturia   . Wears glasses   . Bladder cancer     DX  OCT 2014 W/ INVASIVE HIGH GRADE UROTHELIAL BLADDER CARCINOMA (ONCOLOGIST--  DR Clelia Croft)   Past Surgical History  Procedure Laterality Date  . Total knee arthroplasty Right 12-05-2002  . Excision right saphenous neuroma/ right knee arthroscopic lysis adhesions and manipulation  05-29-2003  . Shoulder arthroscopy with rotator cuff repair and subacromial decompression Left 03-02-2005    RESECTION OPEN DISTAL CLAVICLE   . Orif compound right femur fx  1970'S    AND RIGHT PATELLECTOMY /  HARDWARE REMOVED LATER  . Transthoracic echocardiogram  12-07-2002    NORMAL LV/ EF 55-65%  . Appendectomy  AGE 89  . Cataract extraction w/ intraocular lens  implant, bilateral    . Retinal detachment surgery Right 1980'S  . Cystoscopy with urethral dilatation N/A 05/14/2013    Procedure: CYSTOSCOPY WITH MEATAL DILATION ;  Surgeon: Valetta Fuller, MD;  Location: [facility];  Service: Urology;  Laterality: N/A;  . Circumcision N/A 05/14/2013    Procedure: CIRCUMCISION ADULT;  Surgeon: Valetta Fuller, MD;  Location: [facility];  Service: Urology;  Laterality: N/A;  . Transurethral resection of bladder tumor N/A  05/14/2013    Procedure: TRANSURETHRAL RESECTION OF BLADDER TUMOR (TURBT);  Surgeon: Bernestine Amass, MD;  Location: [facility];  Service: Urology;  Laterality: N/A;  . Transurethral resection of bladder tumor N/A 06/25/2013    Procedure: REPEAT TRANSURETHRAL RESECTION OF BLADDER TUMOR (TURBT);  Surgeon: Bernestine Amass, MD;  Location: [facility];  Service: Urology;  Laterality: N/A;   Family History  Problem Relation Age of Onset  . Heart disease Mother   . Cancer Mother     and 3 sisters and 2  brothers but unsure of type.   History  Substance Use Topics  . Smoking status: Former Smoker -- 1.50 packs/day for 70 years    Types: Cigarettes    Quit date: 07/20/1999  . Smokeless tobacco: Former Systems developer    Types: Chew  . Alcohol Use: No    Review of Systems  Constitutional: Negative for fever and chills.  Respiratory: Positive for cough, shortness of breath and wheezing.   Cardiovascular: Positive for chest pain. Negative for palpitations and leg swelling.  Gastrointestinal: Negative for nausea, vomiting, abdominal pain, diarrhea, blood in stool and anal bleeding.  Genitourinary: Negative for hematuria.      Allergies  Celebrex  Home Medications   Current Outpatient Rx  Name  Route  Sig  Dispense  Refill  . Alum & Mag Hydroxide-Simeth (MAGIC MOUTHWASH W/LIDOCAINE) SOLN   Oral   Take 10 mLs by mouth 4 (four) times daily as needed for mouth pain.   240 mL   5   . AVODART 0.5 MG capsule   Oral   Take 0.5 mg by mouth every morning.          . budesonide-formoterol (SYMBICORT) 80-4.5 MCG/ACT inhaler   Inhalation   Inhale 2 puffs into the lungs 2 (two) times daily.   1 Inhaler   0   . COMBIVENT RESPIMAT 20-100 MCG/ACT AERS   Oral   Take 2 puffs by mouth every 6 (six) hours as needed for wheezing.          Marland Kitchen dextromethorphan-guaiFENesin (MUCINEX DM) 30-600 MG per 12 hr tablet   Oral   Take 2 tablets by mouth 2 (two) times daily.   120 tablet   0   . diphenoxylate-atropine (LOMOTIL) 2.5-0.025 MG per tablet   Oral   Take 1-2 tablets by mouth 4 (four) times daily as needed for diarrhea or loose stools.   60 tablet   0   . feeding supplement, GLUCERNA SHAKE, (GLUCERNA SHAKE) LIQD   Oral   Take 237 mLs by mouth 3 (three) times daily between meals.   90 Can   0   . HYDROcodone-acetaminophen (NORCO/VICODIN) 5-325 MG per tablet   Oral   Take 1-2 tablets by mouth every 4 (four) hours as needed for moderate pain.         . metFORMIN (GLUCOPHAGE) 500 MG  tablet   Oral   Take 1,000 mg by mouth 2 (two) times daily with a meal.         . Multiple Vitamin (MULTIVITAMIN) tablet   Oral   Take 1 tablet by mouth daily.         Marland Kitchen oxybutynin (DITROPAN XL) 10 MG 24 hr tablet   Oral   Take 1 tablet (10 mg total) by mouth daily.   30 tablet   0   . saccharomyces boulardii (FLORASTOR) 250 MG capsule   Oral   Take 1 capsule (250 mg total)  by mouth 2 (two) times daily.   60 capsule   0    BP 106/62  Pulse 109  Temp(Src) 98.9 F (37.2 C) (Oral)  Resp 26  SpO2 96% Physical Exam  Nursing note and vitals reviewed. Constitutional: He is oriented to person, place, and time. He appears well-developed and well-nourished. No distress.  HENT:  Head: Normocephalic and atraumatic.  Neck: Normal range of motion. Neck supple.  Cardiovascular: Regular rhythm.  Tachycardia present.   Murmur heard. Pulses:      Radial pulses are 2+ on the right side.  No lower extremity edema  Pulmonary/Chest: Tachypnea noted. No respiratory distress. He has wheezes in the right lower field and the left lower field. He has rhonchi in the right lower field and the left lower field.  Abdominal: Soft. There is tenderness in the left lower quadrant. There is no rebound.  Neurological: He is alert and oriented to person, place, and time.  Skin: Skin is warm and dry. He is not diaphoretic.  Psychiatric: He has a normal mood and affect. His behavior is normal. Thought content normal.    ED Course  Procedures (including critical care time) Labs Review Labs Reviewed  BASIC METABOLIC PANEL - Abnormal; Notable for the following:    Chloride 95 (*)    Glucose, Bld 129 (*)    GFR calc non Af Amer 78 (*)    GFR calc Af Amer 90 (*)    All other components within normal limits  CBC WITH DIFFERENTIAL - Abnormal; Notable for the following:    RBC 2.75 (*)    Hemoglobin 8.7 (*)    HCT 26.6 (*)    RDW 19.1 (*)    Monocytes Relative 14 (*)    All other components within  normal limits  TROPONIN I  OCCULT BLOOD X 1 CARD TO LAB, STOOL  I-STAT TROPOININ, ED   Imaging Review Dg Chest 2 View  09/22/2013   CLINICAL DATA:  Cough and congestion, history of COPD and bladder malignancy and diabetes  EXAM: CHEST  2 VIEW  COMPARISON:  Portable chest x-ray and chest CT scan of August 29, 2013.  FINDINGS: The lungs are mildly hyperinflated. There is no focal infiltrate. Coarse increased lung markings in the lower lobes posteriorly are present and likely reflect bronchiectasis as seen on the previous CT scan. The cardiopericardial silhouette is top-normal in size. The pulmonary vascularity is not engorged. There is no pleural effusion. The mediastinum is normal in width. There is mild tortuosity of the descending thoracic aorta. The observed portions of the bony thorax exhibit no acute abnormalities.  IMPRESSION: There is hyperinflation consistent with COPD. There is no alveolar pneumonia. Lower lobe increased density is not new and is consistent with bronchiectasis. There is no evidence of CHF.   Electronically Signed   By: David  Martinique   On: 09/22/2013 20:56     EKG Interpretation   Date/Time:  Saturday September 22 2013 20:32:10 EST Ventricular Rate:  115 PR Interval:  158 QRS Duration: 136 QT Interval:  350 QTC Calculation: 484 R Axis:   -82 Text Interpretation:  Sinus tachycardia RBBB and LAFB ST elevation,  consider inferior injury No significant change since last tracing  Confirmed by ZACKOWSKI  MD, SCOTT ([id number]) on 09/22/2013 8:45:24 PM      MDM   Final diagnoses:  COPD exacerbation  Anemia  Bladder cancer   Pt with a history of chest pain and productive cough.  On exam the patient has  a respiration rate of low 20's oxygen saturation 88-90% on RA.  Placed the Pt on 2L [place] and Oxygen saturation increased to 95%. He has rhonchi and wheezing at bilateral bases. Duoneb ordered. EKG without significant changes since last tracing. Chest XR without acute findings, no  signs of pneumonia. Troponin 0.01 CBC shows Hgb 8.7, Pt has a history down trending anemia.  Most recent 09/07/2013 was 10.6. Pt denies bleeding or melenotic stools. 2240: Re-eval pt reports breathing has improved.  Bilateral lung bases clear to ascultation. Pt 93% 2L [place]. Discussed pt condition with Dr. Rogene Houston who advises duoneb x2. 0016: Oxygen saturation 91% on RA.  Will ambulate the patient. Troponin #2 <0.30, negative for ischemia. 0044: pt was able to ambulate without a desaturation but upon examining him he is still tachycardic, HR in 120's, and tachypneic on exam RR 20's. Discussed patient history, labs, and ED treatment with Dr. Roel Cluck, agrees to evaluate the patient in the ED and will admit him. Meds given in ED:  Medications  ipratropium-albuterol (DUONEB) 0.5-2.5 (3) MG/3ML nebulizer solution 5 mL (5 mLs Nebulization Given 09/22/13 2122)  ipratropium-albuterol (DUONEB) 0.5-2.5 (3) MG/3ML nebulizer solution 5 mL (5 mLs Nebulization Given 09/22/13 2252)    New Prescriptions   No medications on file      Lorrine Kin, PA-C 09/23/13 0139

## 2013-09-22 NOTE — ED Notes (Addendum)
Pt reports centralized chest pain that began today, however has had a productive cough with green sputum for "some time now." Pt has a history of COPD, elevated cholesterol, hypertension, and diabetes. Pt also reports nausea, emesis, and weakness. Pt has an oxygen saturation of 96 % on room air with a heart rate of 114. Pt reports having bladder cancer and finished chemotherapy last Friday.

## 2013-09-23 ENCOUNTER — Encounter (HOSPITAL_COMMUNITY): Payer: Self-pay | Admitting: Internal Medicine

## 2013-09-23 DIAGNOSIS — J471 Bronchiectasis with (acute) exacerbation: Secondary | ICD-10-CM | POA: Diagnosis present

## 2013-09-23 DIAGNOSIS — J441 Chronic obstructive pulmonary disease with (acute) exacerbation: Secondary | ICD-10-CM

## 2013-09-23 DIAGNOSIS — D649 Anemia, unspecified: Secondary | ICD-10-CM

## 2013-09-23 DIAGNOSIS — J449 Chronic obstructive pulmonary disease, unspecified: Secondary | ICD-10-CM

## 2013-09-23 DIAGNOSIS — R071 Chest pain on breathing: Secondary | ICD-10-CM | POA: Diagnosis present

## 2013-09-23 DIAGNOSIS — E119 Type 2 diabetes mellitus without complications: Secondary | ICD-10-CM

## 2013-09-23 DIAGNOSIS — E43 Unspecified severe protein-calorie malnutrition: Secondary | ICD-10-CM

## 2013-09-23 LAB — POC OCCULT BLOOD, ED: Fecal Occult Bld: POSITIVE — AB

## 2013-09-23 LAB — CBC
HCT: 24.3 % — ABNORMAL LOW (ref 39.0–52.0)
HEMOGLOBIN: 8.1 g/dL — AB (ref 13.0–17.0)
MCH: 32 pg (ref 26.0–34.0)
MCHC: 33.3 g/dL (ref 30.0–36.0)
MCV: 96 fL (ref 78.0–100.0)
PLATELETS: 289 10*3/uL (ref 150–400)
RBC: 2.53 MIL/uL — AB (ref 4.22–5.81)
RDW: 19.2 % — ABNORMAL HIGH (ref 11.5–15.5)
WBC: 3.7 10*3/uL — AB (ref 4.0–10.5)

## 2013-09-23 LAB — COMPREHENSIVE METABOLIC PANEL
ALBUMIN: 2.9 g/dL — AB (ref 3.5–5.2)
ALT: 8 U/L (ref 0–53)
AST: 14 U/L (ref 0–37)
Alkaline Phosphatase: 57 U/L (ref 39–117)
BUN: 15 mg/dL (ref 6–23)
CO2: 26 meq/L (ref 19–32)
CREATININE: 0.88 mg/dL (ref 0.50–1.35)
Calcium: 9.7 mg/dL (ref 8.4–10.5)
Chloride: 97 mEq/L (ref 96–112)
GFR calc Af Amer: 90 mL/min — ABNORMAL LOW (ref >90)
GFR, EST NON AFRICAN AMERICAN: 77 mL/min — AB (ref >90)
Glucose, Bld: 104 mg/dL — ABNORMAL HIGH (ref 70–99)
Potassium: 4.4 mEq/L (ref 3.7–5.3)
Sodium: 137 mEq/L (ref 137–147)
Total Bilirubin: 0.4 mg/dL (ref 0.3–1.2)
Total Protein: 7.3 g/dL (ref 6.0–8.3)

## 2013-09-23 LAB — IRON AND TIBC
Iron: 37 ug/dL — ABNORMAL LOW (ref 42–135)
SATURATION RATIOS: 19 % — AB (ref 20–55)
TIBC: 193 ug/dL — AB (ref 215–435)
UIBC: 156 ug/dL (ref 125–400)

## 2013-09-23 LAB — TROPONIN I
Troponin I: <0.3 ng/mL (ref <0.30)
Troponin I: <0.3 ng/mL (ref <0.30)

## 2013-09-23 LAB — PHOSPHORUS: Phosphorus: 3.6 mg/dL (ref 2.3–4.6)

## 2013-09-23 LAB — GLUCOSE, CAPILLARY
GLUCOSE-CAPILLARY: 107 mg/dL — AB (ref 70–99)
GLUCOSE-CAPILLARY: 157 mg/dL — AB (ref 70–99)
Glucose-Capillary: 124 mg/dL — ABNORMAL HIGH (ref 70–99)
Glucose-Capillary: 114 mg/dL — ABNORMAL HIGH (ref 70–99)

## 2013-09-23 LAB — HEMOGLOBIN A1C
Hgb A1c MFr Bld: 6 % — ABNORMAL HIGH (ref <5.7)
Mean Plasma Glucose: 126 mg/dL — ABNORMAL HIGH (ref <117)

## 2013-09-23 LAB — FERRITIN: Ferritin: 602 ng/mL — ABNORMAL HIGH (ref 22–322)

## 2013-09-23 LAB — MAGNESIUM: Magnesium: 1.8 mg/dL (ref 1.5–2.5)

## 2013-09-23 LAB — RETICULOCYTES
RBC.: 2.53 MIL/uL — AB (ref 4.22–5.81)
RETIC CT PCT: 3.8 % — AB (ref 0.4–3.1)
Retic Count, Absolute: 96.1 10*3/uL (ref 19.0–186.0)

## 2013-09-23 LAB — TSH: TSH: 4.719 u[IU]/mL — AB (ref 0.350–4.500)

## 2013-09-23 LAB — VITAMIN B12: VITAMIN B 12: 216 pg/mL (ref 211–911)

## 2013-09-23 LAB — FOLATE: Folate: 18.7 ng/mL

## 2013-09-23 LAB — PREALBUMIN: Prealbumin: 11.3 mg/dL — ABNORMAL LOW (ref 17.0–34.0)

## 2013-09-23 MED ORDER — ONDANSETRON HCL 4 MG PO TABS: 4.0000 mg | ORAL_TABLET | Freq: Four times a day (QID) | ORAL | Status: DC | PRN

## 2013-09-23 MED ORDER — INSULIN ASPART 100 UNIT/ML ~~LOC~~ SOLN: 0.0000 [IU] | Freq: Every day | SUBCUTANEOUS | Status: DC

## 2013-09-23 MED ORDER — ACETAMINOPHEN 650 MG RE SUPP: 650.0000 mg | Freq: Four times a day (QID) | RECTAL | Status: DC | PRN

## 2013-09-23 MED ORDER — INSULIN ASPART 100 UNIT/ML ~~LOC~~ SOLN
0.0000 [IU] | Freq: Three times a day (TID) | SUBCUTANEOUS | Status: DC
  Administered 2013-09-23: 2 [IU] via SUBCUTANEOUS
  Administered 2013-09-24: 3 [IU] via SUBCUTANEOUS
  Administered 2013-09-24: 2 [IU] via SUBCUTANEOUS
  Administered 2013-09-24 – 2013-09-25 (×3): 3 [IU] via SUBCUTANEOUS
  Administered 2013-09-25 – 2013-09-26 (×2): 2 [IU] via SUBCUTANEOUS
  Administered 2013-09-27: 3 [IU] via SUBCUTANEOUS

## 2013-09-23 MED ORDER — HYDROCOD POLST-CHLORPHEN POLST 10-8 MG/5ML PO LQCR
5.0000 mL | Freq: Once | ORAL | Status: AC
  Administered 2013-09-23: 5 mL via ORAL
  Filled 2013-09-23: qty 5

## 2013-09-23 MED ORDER — LEVALBUTEROL HCL 0.63 MG/3ML IN NEBU: 0.6300 mg | INHALATION_SOLUTION | RESPIRATORY_TRACT | Status: DC | PRN

## 2013-09-23 MED ORDER — IPRATROPIUM-ALBUTEROL 0.5-2.5 (3) MG/3ML IN SOLN
5.0000 mL | Freq: Four times a day (QID) | RESPIRATORY_TRACT | Status: DC
  Administered 2013-09-23 (×2): 5 mL via RESPIRATORY_TRACT
  Filled 2013-09-23 (×3): qty 3

## 2013-09-23 MED ORDER — LEVOFLOXACIN 750 MG PO TABS
750.0000 mg | ORAL_TABLET | Freq: Every day | ORAL | Status: DC
  Administered 2013-09-24 – 2013-09-27 (×4): 750 mg via ORAL
  Filled 2013-09-23 (×5): qty 1

## 2013-09-23 MED ORDER — SODIUM CHLORIDE 0.9 % IJ SOLN: 3.0000 mL | INTRAMUSCULAR | Status: DC | PRN

## 2013-09-23 MED ORDER — ACETAMINOPHEN 325 MG PO TABS: 650.0000 mg | ORAL_TABLET | Freq: Four times a day (QID) | ORAL | Status: DC | PRN

## 2013-09-23 MED ORDER — IPRATROPIUM BROMIDE 0.02 % IN SOLN
0.5000 mg | Freq: Four times a day (QID) | RESPIRATORY_TRACT | Status: DC
  Administered 2013-09-23: 0.5 mg via RESPIRATORY_TRACT
  Filled 2013-09-23: qty 2.5

## 2013-09-23 MED ORDER — SODIUM CHLORIDE 0.9 % IJ SOLN: 3.0000 mL | Freq: Two times a day (BID) | INTRAMUSCULAR | Status: DC

## 2013-09-23 MED ORDER — OXYBUTYNIN CHLORIDE ER 10 MG PO TB24
10.0000 mg | ORAL_TABLET | Freq: Every day | ORAL | Status: DC
  Administered 2013-09-23 – 2013-09-27 (×5): 10 mg via ORAL
  Filled 2013-09-23 (×6): qty 1

## 2013-09-23 MED ORDER — SACCHAROMYCES BOULARDII 250 MG PO CAPS
250.0000 mg | ORAL_CAPSULE | Freq: Two times a day (BID) | ORAL | Status: DC
  Administered 2013-09-23 – 2013-09-27 (×9): 250 mg via ORAL
  Filled 2013-09-23 (×10): qty 1

## 2013-09-23 MED ORDER — LEVALBUTEROL HCL 0.63 MG/3ML IN NEBU: 0.6300 mg | INHALATION_SOLUTION | Freq: Four times a day (QID) | RESPIRATORY_TRACT | Status: DC | PRN

## 2013-09-23 MED ORDER — SODIUM CHLORIDE 0.9 % IV SOLN: 250.0000 mL | INTRAVENOUS | Status: DC | PRN

## 2013-09-23 MED ORDER — DUTASTERIDE 0.5 MG PO CAPS
0.5000 mg | ORAL_CAPSULE | Freq: Every morning | ORAL | Status: DC
  Administered 2013-09-23 – 2013-09-27 (×5): 0.5 mg via ORAL
  Filled 2013-09-23 (×5): qty 1

## 2013-09-23 MED ORDER — ZOLPIDEM TARTRATE 5 MG PO TABS
5.0000 mg | ORAL_TABLET | Freq: Once | ORAL | Status: DC
  Filled 2013-09-23 (×2): qty 1

## 2013-09-23 MED ORDER — SODIUM CHLORIDE 0.9 % IJ SOLN
3.0000 mL | Freq: Two times a day (BID) | INTRAMUSCULAR | Status: DC
  Administered 2013-09-26 – 2013-09-27 (×2): 3 mL via INTRAVENOUS

## 2013-09-23 MED ORDER — BUDESONIDE-FORMOTEROL FUMARATE 80-4.5 MCG/ACT IN AERO
2.0000 | INHALATION_SPRAY | Freq: Two times a day (BID) | RESPIRATORY_TRACT | Status: DC
  Administered 2013-09-23 – 2013-09-27 (×9): 2 via RESPIRATORY_TRACT
  Filled 2013-09-23: qty 6.9

## 2013-09-23 MED ORDER — DM-GUAIFENESIN ER 30-600 MG PO TB12
2.0000 | ORAL_TABLET | Freq: Two times a day (BID) | ORAL | Status: DC
  Administered 2013-09-23 – 2013-09-27 (×9): 2 via ORAL
  Filled 2013-09-23 (×10): qty 2

## 2013-09-23 MED ORDER — ONDANSETRON HCL 4 MG/2ML IJ SOLN: 4.0000 mg | Freq: Four times a day (QID) | INTRAMUSCULAR | Status: DC | PRN

## 2013-09-23 MED ORDER — GLUCERNA SHAKE PO LIQD
237.0000 mL | Freq: Three times a day (TID) | ORAL | Status: DC
  Administered 2013-09-23 – 2013-09-27 (×7): 237 mL via ORAL
  Filled 2013-09-23 (×15): qty 237

## 2013-09-23 MED ORDER — HYDROCODONE-ACETAMINOPHEN 5-325 MG PO TABS
1.0000 | ORAL_TABLET | ORAL | Status: DC | PRN
  Administered 2013-09-24 – 2013-09-26 (×2): 2 via ORAL
  Filled 2013-09-23 (×2): qty 2

## 2013-09-23 NOTE — Progress Notes (Signed)
  Radiation Oncology         [phone]) [phone] ________________________________  Name: Patrick Lane MRN: [id number]  Date: 08/27/2013  DOB: 1927-11-07  VIRTUAL SIMULATION NOTE  NARRATIVE:  The patient underwent simulation today for ongoing radiation therapy.  The existing CT study set was employed for the purpose of virtual treatment planning.  The target and avoidance structures were reviewed and in some cases modified.  Treatment planning then occurred.  The radiation boost prescription was entered and confirmed.  A total of one complex treatment devices were fabricated in the form of multi-leaf collimator sinogram to shape radiation around the targets while maximally excluding nearby normal structures. I have requested : Isodose Plan.   PLAN:  This modified radiation beam arrangement is intended to continue the current radiation dose to an additional 19.8 Gy in 11 fractions for a total cumulative dose of 1.8 Gy.  ------------------------------------------------  Sheral Apley. Tammi Klippel, M.D.

## 2013-09-23 NOTE — H&P (Signed)
PCP:   Donnie Coffin, MD  Oncology [facility] Tammi Klippel  Chief Complaint:  Shortness of breath  HPI: Patrick Lane is a 80 y.o. male   has a past medical history of COPD (chronic obstructive pulmonary disease); Hyperlipidemia; Type 2 diabetes mellitus; Adult bronchiectasis; Meatal stenosis; History of atrial fibrillation without current medication; AAA (abdominal aortic aneurysm); Productive cough; Dyspnea on exertion; GERD (gastroesophageal reflux disease); History of gastric ulcer; Foot ulcer, left; Arthritis; Bursitis of shoulder; Frequency of urination; Urgency of urination; Nocturia; Wears glasses; and Bladder cancer.   Presented with  Patient was admitted from 2/11 -2/13 for PNA since his discharge he was doing well. Patient is recently finished radiation therapy for bladder cancer. Patient has chronic cough but today feels like it gotten worse, green sputum production no fever. Reports pain with coughing and with taking deep breaths. Patient was afraid he was getting PNA and came to ER. He was found to be tachycardic that is his baseline.  Also was found to have lower Hg down to 8.5. Patient denies any blood in stool reports dark stools but not black he is on lomotil to prevent diarrhea. Hospitalist was called for admission  Review of Systems:     Pertinent positives include: shortness of breath at rest. chest pain, excess mucus productive cough  Constitutional:  No weight loss, night sweats, Fevers, chills, fatigue, weight loss  HEENT:  No headaches, Difficulty swallowing,Tooth/dental problems,Sore throat,  No sneezing, itching, ear ache, nasal congestion, post nasal drip,  Cardio-vascular:  No  Orthopnea, PND, anasarca, dizziness, palpitations.no Bilateral lower extremity swelling  GI:  No heartburn, indigestion, abdominal pain, nausea, vomiting, diarrhea, change in bowel habits, loss of appetite, melena, blood in stool, hematemesis Resp:  no  No dyspnea on exertion, No  , no, No non-productive cough, No coughing up of blood.No change in color of mucus.No wheezing. Skin:  no rash or lesions. No jaundice GU:  no dysuria, change in color of urine, no urgency or frequency. No straining to urinate.  No flank pain.  Musculoskeletal:  No joint pain or no joint swelling. No decreased range of motion. No back pain.  Psych:  No change in mood or affect. No depression or anxiety. No memory loss.  Neuro: no localizing neurological complaints, no tingling, no weakness, no double vision, no gait abnormality, no slurred speech, no confusion  Otherwise ROS are negative except for above, 10 systems were reviewed  Past Medical History: Past Medical History  Diagnosis Date  . COPD (chronic obstructive pulmonary disease)   . Hyperlipidemia   . Type 2 diabetes mellitus   . Adult bronchiectasis   . Meatal stenosis   . History of atrial fibrillation without current medication     POST OP EPISODE 2004   . AAA (abdominal aortic aneurysm)     LAST DUPLEX ULTRASOUND 06-22-2010  3.7x3.6  . Productive cough   . Dyspnea on exertion   . GERD (gastroesophageal reflux disease)   . History of gastric ulcer   . Foot ulcer, left     BOTTOM OF LITTLE TOE , MONITORED BY PCP  DSG DAILY W/ ANTIBIOTIC OINTMENT AND EPSOM SALT SOAKS  . Arthritis   . Bursitis of shoulder     BOTH  . Frequency of urination   . Urgency of urination   . Nocturia   . Wears glasses   . Bladder cancer     DX  OCT 2014 W/ INVASIVE HIGH GRADE UROTHELIAL BLADDER CARCINOMA (ONCOLOGIST--  DR Alen Blew)  Past Surgical History  Procedure Laterality Date  . Total knee arthroplasty Right 12-05-2002  . Excision right saphenous neuroma/ right knee arthroscopic lysis adhesions and manipulation  05-29-2003  . Shoulder arthroscopy with rotator cuff repair and subacromial decompression Left 03-02-2005    RESECTION OPEN DISTAL CLAVICLE   . Orif compound right femur fx  1970'S    AND RIGHT PATELLECTOMY /  HARDWARE  REMOVED LATER  . Transthoracic echocardiogram  12-07-2002    NORMAL LV/ EF 55-65%  . Appendectomy  AGE 9  . Cataract extraction w/ intraocular lens  implant, bilateral    . Retinal detachment surgery Right 1980'S  . Cystoscopy with urethral dilatation N/A 05/14/2013    Procedure: CYSTOSCOPY WITH MEATAL DILATION ;  Surgeon: Bernestine Amass, MD;  Location: [facility];  Service: Urology;  Laterality: N/A;  . Circumcision N/A 05/14/2013    Procedure: CIRCUMCISION ADULT;  Surgeon: Bernestine Amass, MD;  Location: [facility];  Service: Urology;  Laterality: N/A;  . Transurethral resection of bladder tumor N/A 05/14/2013    Procedure: TRANSURETHRAL RESECTION OF BLADDER TUMOR (TURBT);  Surgeon: Bernestine Amass, MD;  Location: [facility];  Service: Urology;  Laterality: N/A;  . Transurethral resection of bladder tumor N/A 06/25/2013    Procedure: REPEAT TRANSURETHRAL RESECTION OF BLADDER TUMOR (TURBT);  Surgeon: Bernestine Amass, MD;  Location[facility];  Service: Urology;  Laterality: N/A;     Medications: Prior to Admission medications   Medication Sig Start Date End Date Taking? Authorizing Provider  Alum & Mag Hydroxide-Simeth (MAGIC MOUTHWASH W/LIDOCAINE) SOLN Take 10 mLs by mouth 4 (four) times daily as needed for mouth pain. 09/03/13  Yes Lora Paula, MD  AVODART 0.5 MG capsule Take 0.5 mg by mouth every morning.  09/17/11  Yes Historical Provider, MD  budesonide-formoterol (SYMBICORT) 80-4.5 MCG/ACT inhaler Inhale 2 puffs into the lungs 2 (two) times daily. 08/31/13  Yes Janece Canterbury, MD  COMBIVENT RESPIMAT 20-100 MCG/ACT AERS Take 2 puffs by mouth every 6 (six) hours as needed for wheezing.  10/04/11  Yes Historical Provider, MD  dextromethorphan-guaiFENesin (MUCINEX DM) 30-600 MG per 12 hr tablet Take 2 tablets by mouth 2 (two) times daily. 08/31/13  Yes Janece Canterbury, MD  diphenoxylate-atropine (LOMOTIL) 2.5-0.025 MG per  tablet Take 1-2 tablets by mouth 4 (four) times daily as needed for diarrhea or loose stools. 09/03/13  Yes Lora Paula, MD  feeding supplement, GLUCERNA SHAKE, (GLUCERNA SHAKE) LIQD Take 237 mLs by mouth 3 (three) times daily between meals. 08/31/13  Yes Janece Canterbury, MD  HYDROcodone-acetaminophen (NORCO/VICODIN) 5-325 MG per tablet Take 1-2 tablets by mouth every 4 (four) hours as needed for moderate pain.   Yes Historical Provider, MD  metFORMIN (GLUCOPHAGE) 500 MG tablet Take 1,000 mg by mouth 2 (two) times daily with a meal.   Yes Historical Provider, MD  Multiple Vitamin (MULTIVITAMIN) tablet Take 1 tablet by mouth daily.   Yes Historical Provider, MD  oxybutynin (DITROPAN XL) 10 MG 24 hr tablet Take 1 tablet (10 mg total) by mouth daily. 09/14/13  Yes Lora Paula, MD  saccharomyces boulardii (FLORASTOR) 250 MG capsule Take 1 capsule (250 mg total) by mouth 2 (two) times daily. 08/31/13  Yes Janece Canterbury, MD    Allergies:   Allergies  Allergen Reactions  . Celebrex [Celecoxib] Hives    Social History:  Ambulatory  independently   Lives at   Home with family living close by  reports that he quit smoking about 14 years ago. His smoking use included Cigarettes. He has a 105 pack-year smoking history. He has quit using smokeless tobacco. His smokeless tobacco use included Chew. He reports that he does not drink alcohol or use illicit drugs.   Family History: family history includes Cancer in his mother; Heart disease in his mother.    Physical Exam: Patient Vitals for the past 24 hrs:  BP Temp Temp src Pulse Resp SpO2  09/22/13 2252 106/62 mmHg - - 109 26 96 %  09/22/13 2032 127/70 mmHg 98.9 F (37.2 C) Oral 114 20 96 %    1. General:  in No Acute distress 2. Psychological: Alert and  Oriented 3. Head/ENT:     Dry Mucous Membranes                          Head Non traumatic, neck supple                            Poor Dentition 4. SKIN:   decreased Skin  turgor,  Skin clean Dry and intact no rash 5. Heart: Regular rate and rhythm systolic Murmur no Rub or gallop 6. Lungs: no wheezes coarse breath sounds at the bases 7. Abdomen: Soft, non-tender, Non distended 8. Lower extremities: no clubbing, cyanosis, or edema 9. Neurologically Grossly intact, moving all 4 extremities equally 10. MSK: Normal range of motion  body mass index is unknown because there is no weight on file.   Labs on Admission:   Recent Labs  09/22/13 2038  NA 137  K 4.5  CL 95*  CO2 25  GLUCOSE 129*  BUN 14  CREATININE 0.87  CALCIUM 9.7   No results found for this basename: AST, ALT, ALKPHOS, BILITOT, PROT, ALBUMIN,  in the last 72 hours No results found for this basename: LIPASE, AMYLASE,  in the last 72 hours  Recent Labs  09/22/13 2038  WBC 4.4  NEUTROABS 2.7  HGB 8.7*  HCT 26.6*  MCV 96.7  PLT 307    Recent Labs  09/23/13 0008  TROPONINI <0.30   No results found for this basename: TSH, T4TOTAL, FREET3, T3FREE, THYROIDAB,  in the last 72 hours No results found for this basename: VITAMINB12, FOLATE, FERRITIN, TIBC, IRON, RETICCTPCT,  in the last 72 hours No results found for this basename: HGBA1C    The CrCl is unknown because both a height and weight (above a minimum accepted value) are required for this calculation. ABG No results found for this basename: phart, pco2, po2, hco3, tco2, acidbasedef, o2sat     No results found for this basename: DDIMER     Other results:  I have pearsonaly reviewed this: ECG REPORT  Rate: 115  Rhythm: Right bundle-branch block ST&T Change: No significant change from prior   Cultures: No results found for this basename: sdes, specrequest, cult, reptstatus       Radiological Exams on Admission: Dg Chest 2 View  09/22/2013   CLINICAL DATA:  Cough and congestion, history of COPD and bladder malignancy and diabetes  EXAM: CHEST  2 VIEW  COMPARISON:  Portable chest x-ray and chest CT scan of August 29, 2013.  FINDINGS: The lungs are mildly hyperinflated. There is no focal infiltrate. Coarse increased lung markings in the lower lobes posteriorly are present and likely reflect bronchiectasis as seen on the previous CT scan. The cardiopericardial silhouette is  top-normal in size. The pulmonary vascularity is not engorged. There is no pleural effusion. The mediastinum is normal in width. There is mild tortuosity of the descending thoracic aorta. The observed portions of the bony thorax exhibit no acute abnormalities.  IMPRESSION: There is hyperinflation consistent with COPD. There is no alveolar pneumonia. Lower lobe increased density is not new and is consistent with bronchiectasis. There is no evidence of CHF.   Electronically Signed   By: David  Martinique   On: 09/22/2013 20:56    Chart has been reviewed  Assessment/Plan  Patient is 80 year old gentleman here with history of bladder cancer, bronchiectasis and type 2 diabetes presenting with bronchiectasis exacerbation  Present on Admission:  . COPD (chronic obstructive pulmonary disease) with chronic bronchitis and bronchiectasis with acute exacerbation - will treat with nebulizer treatments, pulmonary toilet, Levaquin, obtain sputum cultures and evaluate for atypical mycobacteria given history of bronchiectasis he may benefit from pulmonology consult in the future  Chest pain  - most likely secondary to respiratory symptoms persist chest wall tenderness from coughing. Given advanced age will cycle cardiac markers given cardiac murmur obtain echo gram  . Bladder cancer - patient has finished his radiation therapy states he's no longer receiving chemotherapy,  in a.m. with that he oncology know that he is here  . Type 2 diabetes mellitus - sliding scale  . Normocytic anemia -anemia worse from his baseline we'll obtain Hemoccult stool and anemia panel this point no indication for transfusion  . Protein-calorie malnutrition, severe - patient has  history of weight loss and poor by mouth intake. Will obtain prealbumin and nutrition consult    Prophylaxis: SCD, Protonix  CODE STATUS: FULL CODE  Other plan as per orders.  I have spent a total of 55 min on this admission  Patton Rabinovich 09/23/2013, 1:45 AM

## 2013-09-23 NOTE — Progress Notes (Signed)
Pt seen and examined at bedside. Admitted for acute COPD/bronchitis exacerbation. Clinically stable this AM. Please see earlier note by Dr. Roel Cluck. Agree with nebulizers scheduled and as needed, empiric ABX and follow upon sputum analysis. BMP and CBC in AM. Ambulate today.  Faye Ramsay, MD  [facility] Pager [phone]  If 7PM-7AM, please contact night-coverage [email or web address] Password [email or web address]

## 2013-09-23 NOTE — Progress Notes (Signed)
  Echocardiogram 2D Echocardiogram has been performed.  Patrick Lane 09/23/2013, 12:11 PM

## 2013-09-23 NOTE — ED Notes (Signed)
Pulse oximetry while walking W/O O2 93-96 heart rate 131-140 Rn notifited

## 2013-09-24 DIAGNOSIS — R918 Other nonspecific abnormal finding of lung field: Secondary | ICD-10-CM

## 2013-09-24 DIAGNOSIS — C679 Malignant neoplasm of bladder, unspecified: Secondary | ICD-10-CM

## 2013-09-24 LAB — BASIC METABOLIC PANEL
BUN: 14 mg/dL (ref 6–23)
CHLORIDE: 99 meq/L (ref 96–112)
CO2: 29 meq/L (ref 19–32)
Calcium: 9.3 mg/dL (ref 8.4–10.5)
Creatinine, Ser: 0.83 mg/dL (ref 0.50–1.35)
GFR calc Af Amer: >90 mL/min (ref >90)
GFR calc non Af Amer: 79 mL/min — ABNORMAL LOW (ref >90)
GLUCOSE: 125 mg/dL — AB (ref 70–99)
Potassium: 4.4 mEq/L (ref 3.7–5.3)
SODIUM: 140 meq/L (ref 137–147)

## 2013-09-24 LAB — CBC
HCT: 22.9 % — ABNORMAL LOW (ref 39.0–52.0)
HEMOGLOBIN: 7.4 g/dL — AB (ref 13.0–17.0)
MCH: 31.5 pg (ref 26.0–34.0)
MCHC: 32.3 g/dL (ref 30.0–36.0)
MCV: 97.4 fL (ref 78.0–100.0)
Platelets: 261 10*3/uL (ref 150–400)
RBC: 2.35 MIL/uL — AB (ref 4.22–5.81)
RDW: 19.5 % — ABNORMAL HIGH (ref 11.5–15.5)
WBC: 3.3 10*3/uL — AB (ref 4.0–10.5)

## 2013-09-24 LAB — GLUCOSE, CAPILLARY
GLUCOSE-CAPILLARY: 186 mg/dL — AB (ref 70–99)
GLUCOSE-CAPILLARY: 163 mg/dL — AB (ref 70–99)
Glucose-Capillary: 144 mg/dL — ABNORMAL HIGH (ref 70–99)
Glucose-Capillary: 169 mg/dL — ABNORMAL HIGH (ref 70–99)

## 2013-09-24 MED ORDER — IPRATROPIUM-ALBUTEROL 0.5-2.5 (3) MG/3ML IN SOLN
3.0000 mL | Freq: Four times a day (QID) | RESPIRATORY_TRACT | Status: DC
  Administered 2013-09-24 (×4): 3 mL via RESPIRATORY_TRACT
  Filled 2013-09-24 (×3): qty 3

## 2013-09-24 MED ORDER — MOMETASONE FURO-FORMOTEROL FUM 200-5 MCG/ACT IN AERO: 2.0000 | INHALATION_SPRAY | Freq: Two times a day (BID) | RESPIRATORY_TRACT | Status: DC

## 2013-09-24 MED ORDER — IPRATROPIUM-ALBUTEROL 0.5-2.5 (3) MG/3ML IN SOLN: 3.0000 mL | Freq: Four times a day (QID) | RESPIRATORY_TRACT | Status: DC

## 2013-09-24 MED ORDER — FERROUS SULFATE 325 (65 FE) MG PO TABS
325.0000 mg | ORAL_TABLET | Freq: Two times a day (BID) | ORAL | Status: DC
  Administered 2013-09-25 – 2013-09-27 (×4): 325 mg via ORAL
  Filled 2013-09-24 (×7): qty 1

## 2013-09-24 NOTE — Progress Notes (Addendum)
INITIAL NUTRITION ASSESSMENT  DOCUMENTATION CODES Per approved criteria  -Non-severe (moderate) malnutrition in the context of chronic illness  Pt meets criteria for moderate MALNUTRITION in the context of chronic illness as evidenced by 5% weight loss in one month, moderate muscle wasting in clavicle area and subcutaneous fat loss in orbital and upper arm region.   INTERVENTION: -Continue to recommend Glucerna Shake po TID, each supplement provides 220 kcal and 10 grams of protein -Encouraged consumption of high protein/kcal foods -Consider addition of stool softener/laxative if pt has not had BM by 3/10 -Will continue to monitor  NUTRITION DIAGNOSIS: Unintentional wt loss related to inadequate energy intake as evidenced by 10 lbs wt loss in one month.   Goal: Pt to meet >/= 90% of their estimated nutrition needs    Monitor:  Total protein/energy intake, labs, weights, GI profile  Reason for Assessment: Consult/MST  80 y.o. male  Admitting Dx: <principal problem not specified>  ASSESSMENT: Patient was admitted from 2/11 -2/13 for PNA since his discharge he was doing well. Patient is recently finished radiation therapy for bladder cancer. Patient has chronic cough but today feels like it gotten worse, green sputum production no fever. Reports pain with coughing and with taking deep breaths. Patient was afraid he was getting PNA and came to ER. He was found to be tachycardic that is his baseline. Also was found to have lower Hg down to 8.5. Patient denies any blood in stool reports dark stools but not black he is on lomotil to prevent diarrhea  -Pt reported unintentional wt loss of 50 lbs since cancer dx. Recently has lost 10 lbs within past month. -Pt has hx of poor appetite, which he noted has been improving. Had difficulty with certain foods d/t mouth sores, but they have since healed and is tolerated foods w/out difficulty or mouth pain -Denied any recent n/v. Did note having  difficulty with constipation; has not had BM since 3/07. Consider use of stool softener/laxative if unable to have BM by 3/10 -Diet recall indicated pt consuming 3 meals/day. Has family members purchase Ensure and assist with meal preparations. Meals usually consist of protein with vegetables and/or starch. Snacks include Ensure TID and crackers or fruit with peanut butter -Noted that foods are starting to taste better. PO intake during admit 50-90%. Eats less at lunch as he still feels full after breakfast Nutrition Focused Physical Exam:  Subcutaneous Fat:  Orbital Region: moderate Upper Arm Region: moderate Thoracic and Lumbar Region: n/a  Muscle:  Temple Region: moderate Clavicle Bone Region: moderate Clavicle and Acromion Bone Region: WDL Scapular Bone Region: n/a Dorsal Hand: n/a Patellar Region: moderate Anterior Thigh Region: WDL Posterior Calf Region: n/a  Edema: none    Height: Ht Readings from Last 1 Encounters:  09/24/13 6\' 1"  (1.610 m)    Weight: Wt Readings from Last 1 Encounters:  09/24/13 183 lb 10.3 oz (83.3 kg)    Ideal Body Weight: 184 lbs  % Ideal Body Weight: 99%  Wt Readings from Last 10 Encounters:  09/24/13 183 lb 10.3 oz (83.3 kg)  09/14/13 188 lb 12.8 oz (85.639 kg)  09/10/13 187 lb 3.2 oz (84.913 kg)  09/07/13 189 lb 6.4 oz (85.911 kg)  09/05/13 191 lb 3.2 oz (86.728 kg)  09/03/13 193 lb (87.544 kg)  08/29/13 193 lb 6.4 oz (87.726 kg)  08/24/13 198 lb 12.8 oz (90.175 kg)  08/17/13 200 lb 4.8 oz (90.855 kg)  08/17/13 201 lb 6.4 oz (91.354 kg)  Usual Body Weight: 233 lbs-pre bladder cancer wt  % Usual Body Weight: 79%  BMI:  Body mass index is 24.23 kg/(m^2).  Estimated Nutritional Needs: Kcal: 2100-2200 Protein: 85-100 gram Fluid: >/=2200 ml/daily  Skin: WDL  Diet Order: Carb Control  EDUCATION NEEDS: -No education needs identified at this time   Intake/Output Summary (Last 24 hours) at 09/24/13 1147 Last data filed at  09/24/13 0851  Gross per 24 hour  Intake    840 ml  Output    100 ml  Net    740 ml    Last BM: 3/07   Labs:   Recent Labs Lab 09/22/13 2038 09/23/13 0505 09/24/13 0406  NA 137 137 140  K 4.5 4.4 4.4  CL 95* 97 99  CO2 25 26 29   BUN 14 15 14   CREATININE 0.87 0.88 0.83  CALCIUM 9.7 9.7 9.3  MG  --  1.8  --   PHOS  --  3.6  --   GLUCOSE 129* 104* 125*    CBG (last 3)   Recent Labs  09/23/13 1638 09/23/13 2128 09/24/13 0742  GLUCAP 114* 157* 186*    Scheduled Meds: . budesonide-formoterol  2 puff Inhalation BID  . dextromethorphan-guaiFENesin  2 tablet Oral BID  . dutasteride  0.5 mg Oral q morning - 10a  . feeding supplement (GLUCERNA SHAKE)  237 mL Oral TID BM  . insulin aspart  0-15 Units Subcutaneous TID WC  . insulin aspart  0-5 Units Subcutaneous QHS  . ipratropium-albuterol  3 mL Nebulization QID  . levofloxacin  750 mg Oral Daily  . oxybutynin  10 mg Oral Daily  . saccharomyces boulardii  250 mg Oral BID  . sodium chloride  3 mL Intravenous Q12H  . sodium chloride  3 mL Intravenous Q12H  . zolpidem  5 mg Oral Once    Continuous Infusions:   Past Medical History  Diagnosis Date  . COPD (chronic obstructive pulmonary disease)   . Hyperlipidemia   . Type 2 diabetes mellitus   . Adult bronchiectasis   . Meatal stenosis   . History of atrial fibrillation without current medication     POST OP EPISODE 2004   . AAA (abdominal aortic aneurysm)     LAST DUPLEX ULTRASOUND 06-22-2010  3.7x3.6  . Productive cough   . Dyspnea on exertion   . GERD (gastroesophageal reflux disease)   . History of gastric ulcer   . Foot ulcer, left     BOTTOM OF LITTLE TOE , MONITORED BY PCP  DSG DAILY W/ ANTIBIOTIC OINTMENT AND EPSOM SALT SOAKS  . Arthritis   . Bursitis of shoulder     BOTH  . Frequency of urination   . Urgency of urination   . Nocturia   . Wears glasses   . Bladder cancer     DX  OCT 2014 W/ INVASIVE HIGH GRADE UROTHELIAL BLADDER CARCINOMA  (ONCOLOGIST--  DR Alen Blew)    Past Surgical History  Procedure Laterality Date  . Total knee arthroplasty Right 12-05-2002  . Excision right saphenous neuroma/ right knee arthroscopic lysis adhesions and manipulation  05-29-2003  . Shoulder arthroscopy with rotator cuff repair and subacromial decompression Left 03-02-2005    RESECTION OPEN DISTAL CLAVICLE   . Orif compound right femur fx  1970'S    AND RIGHT PATELLECTOMY /  HARDWARE REMOVED LATER  . Transthoracic echocardiogram  12-07-2002    NORMAL LV/ EF 55-65%  . Appendectomy  AGE 17  . Cataract extraction  w/ intraocular lens  implant, bilateral    . Retinal detachment surgery Right 1980'S  . Cystoscopy with urethral dilatation N/A 05/14/2013    Procedure: CYSTOSCOPY WITH MEATAL DILATION ;  Surgeon: Bernestine Amass, MD;  Location: [facility];  Service: Urology;  Laterality: N/A;  . Circumcision N/A 05/14/2013    Procedure: CIRCUMCISION ADULT;  Surgeon: Bernestine Amass, MD;  Location[facility];  Service: Urology;  Laterality: N/A;  . Transurethral resection of bladder tumor N/A 05/14/2013    Procedure: TRANSURETHRAL RESECTION OF BLADDER TUMOR (TURBT);  Surgeon: Bernestine Amass, MD;  Location: [facility];  Service: Urology;  Laterality: N/A;  . Transurethral resection of bladder tumor N/A 06/25/2013    Procedure: REPEAT TRANSURETHRAL RESECTION OF BLADDER TUMOR (TURBT);  Surgeon: Bernestine Amass, MD;  Location: [facility];  Service: Urology;  Laterality: N/A;    Atlee Abide MS RD LDN Clinical Dietitian [phone]

## 2013-09-24 NOTE — Progress Notes (Signed)
SATURATION QUALIFICATIONS: (This note is used to comply with regulatory documentation for home oxygen)  Patient Saturations on Room Air at Rest = 97%  Patient Saturations on Room Air while Ambulating = 77%  Patient Saturations on 2 Liters of oxygen while Ambulating = 100%  Please briefly explain why patient needs home oxygen: oxygen desats <88% while ambulation, requiring supplemental oxygen.

## 2013-09-24 NOTE — Progress Notes (Signed)
Patient ID: Patrick Lane, male   DOB: 24-Oct-1927, 80 y.o.   MRN: [id number] [facility] PROGRESS NOTE  Patrick Lane CVE:[id number] DOB: Jul 26, 1927 DOA: 09/22/2013 PCP: Donnie Coffin, MD  Brief narrative: 80 y.o. male with COPD, HLD, DM type II, a-fib in the past but on on treatment, recent admission in Feb 2015 for PNA, recent radiation therapy for bladder cancer, presented to [facility] ED with main concern of several days duration of progressively worsening shortness of breath, worse with exertion and better with rest, associated with cough productive of yellow sputum, subjective fevers, chills, malaise.   Active Problems:   Acute hypoxic respiratory failure - secondary to COPD and bronchitis exacerbation - pt is clinically improving but hypoxic with ambulation, Oxygen drop to high 70's with ambulation without oxygen - will likely need oxygen upon discharge - will continue Levaquin day #2, follow up on sputum analysis    Malignant neoplasm of lateral wall of urinary bladder - undergoing radiation therapy  - will notify urologist of pt's admission    Normocytic anemia - slight drop in Hg over the past 24 hours - no clear and evident signs of bleeding - will repeat CBC in AM - FOBT is positive and Iron is low - will ask for GI input if Hg continues to drop - place on Iron supplement    Type 2 diabetes mellitus - A1C is 6 - reasonable inpatient control    Protein-calorie malnutrition, severe - advance diet as pt is able to tolerate    Bronchiectasis with acute exacerbation - management with BD's as needed and scheduled , ABX as noted above   Consultants:  None  Procedures/Studies: Dg Chest 2 View   09/22/2013  There is hyperinflation consistent with COPD. There is no alveolar pneumonia. Lower lobe increased density is not new and is consistent with bronchiectasis. There is no evidence of CHF.  Antibiotics:  Levaquin 3/8 -->   Code Status: Full Family Communication: Pt at  bedside Disposition Plan: Home when medically stable  HPI/Subjective: No events overnight.   Objective: Filed Vitals:   09/24/13 0900 09/24/13 1335 09/24/13 1403 09/24/13 1602  BP:   102/62   Pulse:   115   Temp:   98.5 F (36.9 C)   TempSrc:   Oral   Resp:   19   Height: 6\' 1"  (1.854 m)     Weight: 83.3 kg (183 lb 10.3 oz)     SpO2:  97% 99% 99%    Intake/Output Summary (Last 24 hours) at 09/24/13 1912 Last data filed at 09/24/13 1831  Gross per 24 hour  Intake    600 ml  Output      0 ml  Net    600 ml    Exam:   General:  Pt is alert, follows commands appropriately, not in acute distress  Cardiovascular: Regular rhythm, tachycardic, S1/S2, no murmurs, no rubs, no gallops  Respiratory: Clear to auscultation bilaterally, no wheezing, diminished breath sounds at bases   Abdomen: Soft, non tender, non distended, bowel sounds present, no guarding  Extremities: No edema, pulses DP and PT palpable bilaterally  Neuro: Grossly nonfocal  Data Reviewed: Basic Metabolic Panel:  Recent Labs Lab 09/22/13 2038 09/23/13 0505 09/24/13 0406  NA 137 137 140  K 4.5 4.4 4.4  CL 95* 97 99  CO2 25 26 29   GLUCOSE 129* 104* 125*  BUN 14 15 14   CREATININE 0.87 0.88 0.83  CALCIUM 9.7 9.7 9.3  MG  --  1.8  --   PHOS  --  3.6  --    Liver Function Tests:  Recent Labs Lab 09/23/13 0505  AST 14  ALT 8  ALKPHOS 57  BILITOT 0.4  PROT 7.3  ALBUMIN 2.9*   CBC:  Recent Labs Lab 09/22/13 2038 09/23/13 0505 09/24/13 0406  WBC 4.4 3.7* 3.3*  NEUTROABS 2.7  --   --   HGB 8.7* 8.1* 7.4*  HCT 26.6* 24.3* 22.9*  MCV 96.7 96.0 97.4  PLT 307 289 261   Cardiac Enzymes:  Recent Labs Lab 09/23/13 0008 09/23/13 0505 09/23/13 1050 09/23/13 1750  TROPONINI <0.30 <0.30 <0.30 <0.30   CBG:  Recent Labs Lab 09/23/13 1638 09/23/13 2128 09/24/13 0742 09/24/13 1142 09/24/13 1642  GLUCAP 114* 157* 186* 163* 144*   Scheduled Meds: . budesonide-formoterol  2 puff  Inhalation BID  . dextromethorphan-guaiFENesin  2 tablet Oral BID  . dutasteride  0.5 mg Oral q morning - 10a  . feeding supplement (GLUCERNA SHAKE)  237 mL Oral TID BM  . insulin aspart  0-15 Units Subcutaneous TID WC  . insulin aspart  0-5 Units Subcutaneous QHS  . ipratropium-albuterol  3 mL Nebulization QID  . levofloxacin  750 mg Oral Daily  . oxybutynin  10 mg Oral Daily  . saccharomyces boulardii  250 mg Oral BID  . sodium chloride  3 mL Intravenous Q12H  . sodium chloride  3 mL Intravenous Q12H  . zolpidem  5 mg Oral Once   Continuous Infusions:    Faye Ramsay, MD  [facility] Pager [phone]  If 7PM-7AM, please contact night-coverage [email or web address] Password [email or web address] 09/24/2013, 7:12 PM   LOS: 2 days

## 2013-09-24 NOTE — Evaluation (Signed)
Physical Therapy Evaluation Patient Details Name: Patrick Lane MRN: [id number] DOB: 11/19/1927 Today's Date: 09/24/2013 Time: 1205-1229 PT Time Calculation (min): 24 min  PT Assessment / Plan / Recommendation History of Present Illness  80 year old gentleman here with history of bladder cancer, bronchiectasis and type 2 diabetes presenting with bronchiectasis exacerbation.  Patient was admitted from 2/11 -2/13 for PNA since his discharge he was doing well. Patient is recently finished radiation therapy for bladder cancer. Patient has chronic cough but today feels like it gotten worse, green sputum production no fever. Reports pain with coughing and with taking deep breaths. Patient was afraid he was getting PNA and came to ER. He was found to be tachycardic that is his baseline.  Also was found to have lower Hg down to 8.5. Patient denies any blood in stool reports dark stools but not black he is on lomotil to prevent diarrhea. Hospitalist was called for admission  Clinical Impression  Pt admitted with above. Pt currently with functional limitations due to the deficits listed below (see PT Problem List).  Pt will benefit from skilled PT to increase their independence and safety with mobility to allow discharge to the venue listed below.  Pt very agreeable to ambulation and hopes to return to his PLOF as soon as possible.        PT Assessment  Patient needs continued PT services    Follow Up Recommendations  Home health PT;Supervision for mobility/OOB    Does the patient have the potential to tolerate intense rehabilitation      Barriers to Discharge        Equipment Recommendations  None recommended by PT    Recommendations for Other Services     Frequency Min 3X/week    Precautions / Restrictions Precautions Precautions: Fall Precaution Comments: monitor vitals   Pertinent Vitals/Pain SpO2 on room air 97% SpO2 after using bathroom 77% on room air so reapplied 2L O2 [place] and SpO2  back up to 100% prior to ambulation Ambulated on 2L O2      Mobility  Bed Mobility Overal bed mobility: Needs Assistance Bed Mobility: Supine to Sit Supine to sit: Min assist General bed mobility comments: assist for trunk, pt feeling weaker since admission Transfers Overall transfer level: Needs assistance Equipment used: Rolling walker (2 wheeled) Transfers: Sit to/from Stand Sit to Stand: Mod assist General transfer comment: min to mod assist required to assist to standing due to weakness and to steady depending on surface height, verbal cues for hand placement, pt with shaky UEs during transfers reports at his baseline Ambulation/Gait Ambulation/Gait assistance: Min assist Ambulation Distance (Feet): 140 Feet Assistive device: Rolling walker (2 wheeled) Gait Pattern/deviations: Step-through pattern;Trunk flexed;Decreased stride length Gait velocity: decr General Gait Details: ambulated on 2L O2  due to desaturation to 77% on room air while using bathroom    Exercises     PT Diagnosis: Difficulty walking  PT Problem List: Decreased strength;Decreased activity tolerance;Decreased mobility;Decreased balance;Decreased knowledge of use of DME;Cardiopulmonary status limiting activity PT Treatment Interventions: DME instruction;Functional mobility training;Therapeutic activities;Therapeutic exercise;Patient/family education;Balance training;Neuromuscular re-education;Gait training     PT Goals(Current goals can be found in the care plan section) Acute Rehab PT Goals PT Goal Formulation: With patient Time For Goal Achievement: 10/01/13 Potential to Achieve Goals: Good  Visit Information  History of Present Illness: 80 year old gentleman here with history of bladder cancer, bronchiectasis and type 2 diabetes presenting with bronchiectasis exacerbation.  Patient was admitted from 2/11 -2/13 for PNA since his discharge  he was doing well. Patient is recently finished radiation  therapy for bladder cancer. Patient has chronic cough but today feels like it gotten worse, green sputum production no fever. Reports pain with coughing and with taking deep breaths. Patient was afraid he was getting PNA and came to ER. He was found to be tachycardic that is his baseline.  Also was found to have lower Hg down to 8.5. Patient denies any blood in stool reports dark stools but not black he is on lomotil to prevent diarrhea. Hospitalist was called for admission       Prior [place] expects to be discharged to:: Private residence Living Arrangements: Other relatives;Non-relatives/Friends Available Help at Discharge: Available 24 hours/day;Friend(s);Family Type of Home: House Home Access: Level entry Home Layout: One level Home Equipment: Ross - 2 wheels;Cane - single point;Bedside commode;Shower seat;Wheelchair - manual Additional Comments: pt states he always has someone home with him Prior Function Level of Independence: Independent with assistive device(s) Communication Communication: No difficulties    Cognition  Cognition Arousal/Alertness: Awake/alert Behavior During Therapy: WFL for tasks assessed/performed Overall Cognitive Status: Within Functional Limits for tasks assessed    Extremity/Trunk Assessment Lower Extremity Assessment Lower Extremity Assessment: Generalized weakness;LLE deficits/detail LLE Deficits / Details: appears to have more functional weakness in L LE per observation   Balance    End of Session PT - End of Session Equipment Utilized During Treatment: Oxygen Activity Tolerance: Patient limited by fatigue Patient left: in chair;with call bell/phone within reach;with nursing/sitter in room Nurse Communication: Mobility status  GP     Shelonda Saxe,KATHrine E 09/24/2013, 1:21 PM Carmelia Bake, PT, DPT 09/24/2013 Pager: [phone]

## 2013-09-25 DIAGNOSIS — E440 Moderate protein-calorie malnutrition: Secondary | ICD-10-CM | POA: Insufficient documentation

## 2013-09-25 LAB — CBC
HEMATOCRIT: 21.7 % — AB (ref 39.0–52.0)
HEMOGLOBIN: 7.1 g/dL — AB (ref 13.0–17.0)
MCH: 32 pg (ref 26.0–34.0)
MCHC: 32.7 g/dL (ref 30.0–36.0)
MCV: 97.7 fL (ref 78.0–100.0)
Platelets: 277 10*3/uL (ref 150–400)
RBC: 2.22 MIL/uL — ABNORMAL LOW (ref 4.22–5.81)
RDW: 19.5 % — ABNORMAL HIGH (ref 11.5–15.5)
WBC: 3.6 10*3/uL — ABNORMAL LOW (ref 4.0–10.5)

## 2013-09-25 LAB — GLUCOSE, CAPILLARY
GLUCOSE-CAPILLARY: 157 mg/dL — AB (ref 70–99)
Glucose-Capillary: 134 mg/dL — ABNORMAL HIGH (ref 70–99)
Glucose-Capillary: 168 mg/dL — ABNORMAL HIGH (ref 70–99)
Glucose-Capillary: 107 mg/dL — ABNORMAL HIGH (ref 70–99)

## 2013-09-25 LAB — BASIC METABOLIC PANEL
BUN: 17 mg/dL (ref 6–23)
CALCIUM: 9.2 mg/dL (ref 8.4–10.5)
CO2: 30 mEq/L (ref 19–32)
Chloride: 96 mEq/L (ref 96–112)
Creatinine, Ser: 0.93 mg/dL (ref 0.50–1.35)
GFR calc Af Amer: 88 mL/min — ABNORMAL LOW (ref >90)
GFR calc non Af Amer: 76 mL/min — ABNORMAL LOW (ref >90)
GLUCOSE: 169 mg/dL — AB (ref 70–99)
Potassium: 4.2 mEq/L (ref 3.7–5.3)
Sodium: 137 mEq/L (ref 137–147)

## 2013-09-25 MED ORDER — PEG 3350-KCL-NA BICARB-NACL 420 G PO SOLR: 4000.0000 mL | Freq: Once | ORAL | Status: DC

## 2013-09-25 MED ORDER — PEG 3350-KCL-NA BICARB-NACL 420 G PO SOLR
2000.0000 mL | Freq: Once | ORAL | Status: AC
  Administered 2013-09-26: 2000 mL via ORAL

## 2013-09-25 MED ORDER — BISACODYL 5 MG PO TBEC
10.0000 mg | DELAYED_RELEASE_TABLET | Freq: Once | ORAL | Status: AC
  Administered 2013-09-25: 10 mg via ORAL
  Filled 2013-09-25: qty 2

## 2013-09-25 MED ORDER — PEG 3350-KCL-NA BICARB-NACL 420 G PO SOLR
4000.0000 mL | Freq: Once | ORAL | Status: AC
  Administered 2013-09-25: 4000 mL via ORAL

## 2013-09-25 MED ORDER — IPRATROPIUM-ALBUTEROL 0.5-2.5 (3) MG/3ML IN SOLN: 3.0000 mL | Freq: Four times a day (QID) | RESPIRATORY_TRACT | Status: DC | PRN

## 2013-09-25 MED ORDER — SODIUM CHLORIDE 0.9 % IV SOLN
INTRAVENOUS | Status: DC
  Administered 2013-09-25: via INTRAVENOUS
  Administered 2013-09-26: 500 mL via INTRAVENOUS

## 2013-09-25 MED ORDER — IPRATROPIUM-ALBUTEROL 0.5-2.5 (3) MG/3ML IN SOLN
3.0000 mL | Freq: Three times a day (TID) | RESPIRATORY_TRACT | Status: DC
  Administered 2013-09-25 – 2013-09-27 (×7): 3 mL via RESPIRATORY_TRACT
  Filled 2013-09-25 (×8): qty 3

## 2013-09-25 MED ORDER — ZOLPIDEM TARTRATE 5 MG PO TABS
5.0000 mg | ORAL_TABLET | Freq: Once | ORAL | Status: AC
  Administered 2013-09-25: 5 mg via ORAL
  Filled 2013-09-25: qty 1

## 2013-09-25 NOTE — Progress Notes (Signed)
Patient recently became active with [facility] services. Met with him at bedside to make aware that [facility] will continue to follow. He has supportive family and friends that help him at home. Discussed him going to SNF at discharge. He states he plans on going home. Patient could benefit from home health RN for COPD management in addition to physical therapy per PT recommendations. Will discuss patient with inpatient RNCM.  Marthenia Rolling, MSN- [facility] [phone]

## 2013-09-25 NOTE — Progress Notes (Signed)
Patient ID: Patrick Lane, male   DOB: 1927/07/27, 80 y.o.   MRN: [id number] . [facility] PROGRESS NOTE  NATURE VOGELSANG DEY:[id number] DOB: Sep 04, 1927 DOA: 09/22/2013 PCP: Donnie Coffin, MD   Brief narrative:  80 y.o. male with COPD, HLD, DM type II, a-fib in the past but on on treatment, recent admission in Feb 2015 for PNA, recent radiation therapy for bladder cancer, presented to [facility] ED with main concern of several days duration of progressively worsening shortness of breath, worse with exertion and better with rest, associated with cough productive of yellow sputum, subjective fevers, chills, malaise.   Active Problems:  Acute hypoxic respiratory failure  - secondary to COPD and bronchitis exacerbation and ? Contribution from blood loss anemia  - pt is clinically improving but hypoxic with ambulation, Oxygen dropped to high 70's with ambulation without oxygen  - will likely need oxygen upon discharge  - will continue Levaquin day #3, follow up on sputum analysis which is negative to date Malignant neoplasm of lateral wall of urinary bladder  - undergoing radiation therapy  - will notify urologist of pt's admission  Normocytic anemia  - slight drop in Hg over the past 24 hours but compared to recent Hg values it is down by 2-3 points - Hg several months back was 10-11 - FOBT positive and iron is low - appreciate GI input, will follow up on recommendations - may need transfusion if further drop in Hg - repeat CBC in AM Type 2 diabetes mellitus  - A1C is 6  - reasonable inpatient control  Protein-calorie malnutrition, severe  - advance diet as pt is able to tolerate  Bronchiectasis with acute exacerbation  - management with BD's as needed and scheduled , ABX as noted above   Consultants:  None Procedures/Studies:  Dg Chest 2 View 09/22/2013 There is hyperinflation consistent with COPD. There is no alveolar pneumonia. Lower lobe increased density is not new and is  consistent with bronchiectasis. There is no evidence of CHF. Antibiotics:  Levaquin 3/8 -->   Code Status: Full  Family Communication: Pt at bedside  Disposition Plan: Home when medically stable  HPI/Subjective: No events overnight.   Objective: Filed Vitals:   09/24/13 1602 09/24/13 2057 09/24/13 2129 09/25/13 0500  BP:   107/39 139/55  Pulse:   112 107  Temp:   98.4 F (36.9 C) 98.7 F (37.1 C)  TempSrc:   Oral Axillary  Resp:   20 22  Height:      Weight:      SpO2: 99% 95% 97% 99%    Intake/Output Summary (Last 24 hours) at 09/25/13 1003 Last data filed at 09/25/13 0600  Gross per 24 hour  Intake    950 ml  Output      0 ml  Net    950 ml    Exam:   General:  Pt is alert, follows commands appropriately, not in acute distress  Cardiovascular: Regular rhythm, tachycardic, S1/S2, no murmurs, no rubs, no gallops  Respiratory: Clear to auscultation bilaterally, diminished breath sounds at bases   Abdomen: Soft, non tender, non distended, bowel sounds present, no guarding  Extremities: No edema, pulses DP and PT palpable bilaterally  Neuro: Grossly nonfocal  Data Reviewed: Basic Metabolic Panel:  Recent Labs Lab 09/22/13 2038 09/23/13 0505 09/24/13 0406 09/25/13 0400  NA 137 137 140 137  K 4.5 4.4 4.4 4.2  CL 95* 97 99 96  CO2 25 26 29 30   GLUCOSE 129* 104*  125* 169*  BUN 14 15 14 17   CREATININE 0.87 0.88 0.83 0.93  CALCIUM 9.7 9.7 9.3 9.2  MG  --  1.8  --   --   PHOS  --  3.6  --   --    Liver Function Tests:  Recent Labs Lab 09/23/13 0505  AST 14  ALT 8  ALKPHOS 57  BILITOT 0.4  PROT 7.3  ALBUMIN 2.9*   CBC:  Recent Labs Lab 09/22/13 2038 09/23/13 0505 09/24/13 0406 09/25/13 0400  WBC 4.4 3.7* 3.3* 3.6*  NEUTROABS 2.7  --   --   --   HGB 8.7* 8.1* 7.4* 7.1*  HCT 26.6* 24.3* 22.9* 21.7*  MCV 96.7 96.0 97.4 97.7  PLT 307 289 261 277   Cardiac Enzymes:  Recent Labs Lab 09/23/13 0008 09/23/13 0505 09/23/13 1050  09/23/13 1750  TROPONINI <0.30 <0.30 <0.30 <0.30   CBG:  Recent Labs Lab 09/24/13 0742 09/24/13 1142 09/24/13 1642 09/24/13 2127 09/25/13 0729  GLUCAP 186* 163* 144* 169* 134*    Scheduled Meds: . budesonide-formoterol  2 puff Inhalation BID  . dextromethorphan-guaiFENesin  2 tablet Oral BID  . dutasteride  0.5 mg Oral q morning - 10a  . feeding supplement (GLUCERNA SHAKE)  237 mL Oral TID BM  . ferrous sulfate  325 mg Oral BID WC  . insulin aspart  0-15 Units Subcutaneous TID WC  . insulin aspart  0-5 Units Subcutaneous QHS  . ipratropium-albuterol  3 mL Nebulization TID  . levofloxacin  750 mg Oral Daily  . oxybutynin  10 mg Oral Daily  . saccharomyces boulardii  250 mg Oral BID  . sodium chloride  3 mL Intravenous Q12H  . sodium chloride  3 mL Intravenous Q12H  . zolpidem  5 mg Oral Once   Continuous Infusions:    Faye Ramsay, MD  [facility] Pager [phone]  If 7PM-7AM, please contact night-coverage [email or web address] Password [email or web address] 09/25/2013, 10:03 AM   LOS: 3 days

## 2013-09-25 NOTE — Consult Note (Signed)
[facility] Consultation Note  Referring Provider:  Dr. Rebecca Eaton Magick-Myers Primary Care Physician:  Donnie Coffin, MD Primary Gastroenterologist:  Dr. Ronald Lobo  Reason for Consultation:  Anemia, hemoccult-positive stool  HPI: Patrick Lane is a 80 y.o. male whom I've been asked to see for anemia and hemoccult-positive stool.  Recent pneumonia in February, which patient tells me has essentially resolved.  Was admitted for progressive shortness of breath and weakness, with decrease in baseline hemoglobin.  Dark stools, but denies hematemesis, melena, hematochezia.  No abdominal pain.  Stools hemoccult-positive.  No dysphagia.  Recent treatment (radiation) for bladder cancer, no gross hematuria.  Has had several colonoscopies by Dr. Cristina Gong, last May 2012, with multiple polyps seen and three year recall suggested.  Don't see any prior endoscopies on our [facility] record.  Patient has chronic diarrhea, and random colonic biopsies during his May 2012 colonoscopy showed no evidence of microscopic colitis.  Has lost a lot of weight recently in midst of his bladder cancer, but he tells me his appetite is picking up and he's gaining some weight back.   Past Medical History  Diagnosis Date  . COPD (chronic obstructive pulmonary disease)   . Hyperlipidemia   . Type 2 diabetes mellitus   . Adult bronchiectasis   . Meatal stenosis   . History of atrial fibrillation without current medication     POST OP EPISODE 2004   . AAA (abdominal aortic aneurysm)     LAST DUPLEX ULTRASOUND 06-22-2010  3.7x3.6  . Productive cough   . Dyspnea on exertion   . GERD (gastroesophageal reflux disease)   . History of gastric ulcer   . Foot ulcer, left     BOTTOM OF LITTLE TOE , MONITORED BY PCP  DSG DAILY W/ ANTIBIOTIC OINTMENT AND EPSOM SALT SOAKS  . Arthritis   . Bursitis of shoulder     BOTH  . Frequency of urination   . Urgency of urination   . Nocturia   . Wears glasses   .  Bladder cancer     DX  OCT 2014 W/ INVASIVE HIGH GRADE UROTHELIAL BLADDER CARCINOMA (ONCOLOGIST--  DR Alen Blew)    Past Surgical History  Procedure Laterality Date  . Total knee arthroplasty Right 12-05-2002  . Excision right saphenous neuroma/ right knee arthroscopic lysis adhesions and manipulation  05-29-2003  . Shoulder arthroscopy with rotator cuff repair and subacromial decompression Left 03-02-2005    RESECTION OPEN DISTAL CLAVICLE   . Orif compound right femur fx  1970'S    AND RIGHT PATELLECTOMY /  HARDWARE REMOVED LATER  . Transthoracic echocardiogram  12-07-2002    NORMAL LV/ EF 55-65%  . Appendectomy  AGE 52  . Cataract extraction w/ intraocular lens  implant, bilateral    . Retinal detachment surgery Right 1980'S  . Cystoscopy with urethral dilatation N/A 05/14/2013    Procedure: CYSTOSCOPY WITH MEATAL DILATION ;  Surgeon: Bernestine Amass, MD;  Location: [facility];  Service: Urology;  Laterality: N/A;  . Circumcision N/A 05/14/2013    Procedure: CIRCUMCISION ADULT;  Surgeon: Bernestine Amass, MD;  Location: [facility];  Service: Urology;  Laterality: N/A;  . Transurethral resection of bladder tumor N/A 05/14/2013    Procedure: TRANSURETHRAL RESECTION OF BLADDER TUMOR (TURBT);  Surgeon: Bernestine Amass, MD;  Location: [facility];  Service: Urology;  Laterality: N/A;  . Transurethral resection of bladder tumor N/A 06/25/2013    Procedure: REPEAT TRANSURETHRAL RESECTION OF BLADDER  TUMOR (TURBT);  Surgeon: Bernestine Amass, MD;  Location: [facility];  Service: Urology;  Laterality: N/A;    Prior to Admission medications   Medication Sig Start Date End Date Taking? Authorizing Provider  Alum & Mag Hydroxide-Simeth (MAGIC MOUTHWASH W/LIDOCAINE) SOLN Take 10 mLs by mouth 4 (four) times daily as needed for mouth pain. 09/03/13  Yes Lora Paula, MD  AVODART 0.5 MG capsule Take 0.5 mg by mouth every morning.  09/17/11  Yes  Historical Provider, MD  budesonide-formoterol (SYMBICORT) 80-4.5 MCG/ACT inhaler Inhale 2 puffs into the lungs 2 (two) times daily. 08/31/13  Yes Janece Canterbury, MD  COMBIVENT RESPIMAT 20-100 MCG/ACT AERS Take 2 puffs by mouth every 6 (six) hours as needed for wheezing.  10/04/11  Yes Historical Provider, MD  dextromethorphan-guaiFENesin (MUCINEX DM) 30-600 MG per 12 hr tablet Take 2 tablets by mouth 2 (two) times daily. 08/31/13  Yes Janece Canterbury, MD  diphenoxylate-atropine (LOMOTIL) 2.5-0.025 MG per tablet Take 1-2 tablets by mouth 4 (four) times daily as needed for diarrhea or loose stools. 09/03/13  Yes Lora Paula, MD  feeding supplement, GLUCERNA SHAKE, (GLUCERNA SHAKE) LIQD Take 237 mLs by mouth 3 (three) times daily between meals. 08/31/13  Yes Janece Canterbury, MD  HYDROcodone-acetaminophen (NORCO/VICODIN) 5-325 MG per tablet Take 1-2 tablets by mouth every 4 (four) hours as needed for moderate pain.   Yes Historical Provider, MD  metFORMIN (GLUCOPHAGE) 500 MG tablet Take 1,000 mg by mouth 2 (two) times daily with a meal.   Yes Historical Provider, MD  Multiple Vitamin (MULTIVITAMIN) tablet Take 1 tablet by mouth daily.   Yes Historical Provider, MD  oxybutynin (DITROPAN XL) 10 MG 24 hr tablet Take 1 tablet (10 mg total) by mouth daily. 09/14/13  Yes Lora Paula, MD  saccharomyces boulardii (FLORASTOR) 250 MG capsule Take 1 capsule (250 mg total) by mouth 2 (two) times daily. 08/31/13  Yes Janece Canterbury, MD    Current Facility-Administered Medications  Medication Dose Route Frequency Provider Last Rate Last Dose  . 0.9 %  sodium chloride infusion  250 mL Intravenous PRN Toy Baker, MD      . acetaminophen (TYLENOL) tablet 650 mg  650 mg Oral Q6H PRN Toy Baker, MD       Or  . acetaminophen (TYLENOL) suppository 650 mg  650 mg Rectal Q6H PRN Toy Baker, MD      . budesonide-formoterol (SYMBICORT) 80-4.5 MCG/ACT inhaler 2 puff  2 puff Inhalation BID  Toy Baker, MD   2 puff at 09/25/13 0900  . dextromethorphan-guaiFENesin (MUCINEX DM) 30-600 MG per 12 hr tablet 2 tablet  2 tablet Oral BID Toy Baker, MD   2 tablet at 09/25/13 1048  . dutasteride (AVODART) capsule 0.5 mg  0.5 mg Oral q morning - 10a Toy Baker, MD   0.5 mg at 09/25/13 1048  . feeding supplement (GLUCERNA SHAKE) (GLUCERNA SHAKE) liquid 237 mL  237 mL Oral TID BM Anastassia Doutova, MD   237 mL at 09/25/13 1049  . ferrous sulfate tablet 325 mg  325 mg Oral BID WC Theodis Blaze, MD   325 mg at 09/25/13 0804  . HYDROcodone-acetaminophen (NORCO/VICODIN) 5-325 MG per tablet 1-2 tablet  1-2 tablet Oral Q4H PRN Toy Baker, MD   2 tablet at 09/24/13 2000  . insulin aspart (novoLOG) injection 0-15 Units  0-15 Units Subcutaneous TID WC Toy Baker, MD   3 Units at 09/25/13 1207  . insulin aspart (novoLOG) injection 0-5 Units  0-5 Units Subcutaneous QHS Toy Baker, MD      . ipratropium-albuterol (DUONEB) 0.5-2.5 (3) MG/3ML nebulizer solution 3 mL  3 mL Nebulization TID Theodis Blaze, MD   3 mL at 09/25/13 1422  . ipratropium-albuterol (DUONEB) 0.5-2.5 (3) MG/3ML nebulizer solution 3 mL  3 mL Nebulization Q6H PRN Theodis Blaze, MD      . levalbuterol Penne Lash) nebulizer solution 0.63 mg  0.63 mg Nebulization Q4H PRN Theodis Blaze, MD      . levofloxacin [facility]) tablet 750 mg  750 mg Oral Daily Toy Baker, MD   750 mg at 09/25/13 1048  . ondansetron (ZOFRAN) tablet 4 mg  4 mg Oral Q6H PRN Toy Baker, MD       Or  . ondansetron (ZOFRAN) injection 4 mg  4 mg Intravenous Q6H PRN Toy Baker, MD      . oxybutynin (DITROPAN-XL) 24 hr tablet 10 mg  10 mg Oral Daily Toy Baker, MD   10 mg at 09/25/13 1048  . saccharomyces boulardii (FLORASTOR) capsule 250 mg  250 mg Oral BID Toy Baker, MD   250 mg at 09/25/13 1048  . sodium chloride 0.9 % injection 3 mL  3 mL Intravenous Q12H Anastassia Doutova, MD      .  sodium chloride 0.9 % injection 3 mL  3 mL Intravenous Q12H Anastassia Doutova, MD      . sodium chloride 0.9 % injection 3 mL  3 mL Intravenous PRN Toy Baker, MD      . zolpidem (AMBIEN) tablet 5 mg  5 mg Oral Once Jeryl Columbia, NP        Allergies as of 09/22/2013 - Review Complete 09/22/2013  Allergen Reaction Noted  . Celebrex [celecoxib] Hives 05/08/2013    Family History  Problem Relation Age of Onset  . Heart disease Mother   . Cancer Mother     and 3 sisters and 2 brothers but unsure of type.    History   Social History  . Marital Status: Widowed    Spouse Name: N/A    Number of Children: 1  . Years of Education: N/A   Occupational History  . Retired Dealer   .     Social History Main Topics  . Smoking status: Former Smoker -- 1.50 packs/day for 70 years    Types: Cigarettes    Quit date: 07/20/1999  . Smokeless tobacco: Former Systems developer    Types: Chew  . Alcohol Use: No  . Drug Use: No  . Sexual Activity: Not on file   Other Topics Concern  . Not on file   Social History Narrative   Widowed.  Lives with a friend and has other family who help provide his care.  Normally ambulates independently.    Review of Systems: ROS Dr. Roel Cluck 09/23/13 reviewed and I agree  Physical Exam: Vital signs in last 24 hours: Temp:  [97.7 F (36.5 C)-98.7 F (37.1 C)] 97.7 F (36.5 C) (03/10 1300) Pulse Rate:  [106-112] 106 (03/10 1300) Resp:  [20-22] 20 (03/10 1300) BP: (107-139)/(39-66) 127/66 mmHg (03/10 1300) SpO2:  [95 %-99 %] 96 % (03/10 1422) Last BM Date: 09/24/13 General:   Alert,  Well-developed, well-nourished, elderly but non-toxic appearing is in no acute distress Head:  Normocephalic and atraumatic. Eyes:  Sclera clear, no icterus.   Conjunctiva pink. Ears:  Normal auditory acuity. Nose:  No deformity, discharge,  or lesions. Mouth:  No deformity or lesions.  Oropharynx pink & moist.  Neck:  Supple; no masses or thyromegaly. Lungs:   Diffusely diminished breath sounds, seemingly chronic, otherwise clear throughout to auscultation.   No wheezes, crackles, or rhonchi. No acute distress. Heart:  Regular rate and rhythm; no murmurs, clicks, rubs,  or gallops. Abdomen:  Soft, nontender and nondistended. No masses, hepatosplenomegaly or hernias noted. Normal bowel sounds, without guarding, and without rebound.     Msk:  Diffuse muscular atrophy, otherwise symmetrical without gross deformities. Normal posture. Pulses:  Normal pulses noted. Extremities:  Without clubbing or edema. Neurologic:  Alert and  oriented x4;  Diffusely weak, otherwise grossly normal neurologically. Skin:  Scattered small ecchymoses, otherwise intact without significant lesions or rashes. Cervical Nodes:  No significant cervical adenopathy. Psych:  Alert and cooperative. Normal mood and affect.   Lab Results:  Recent Labs  09/23/13 0505 09/24/13 0406 09/25/13 0400  WBC 3.7* 3.3* 3.6*  HGB 8.1* 7.4* 7.1*  HCT 24.3* 22.9* 21.7*  PLT 289 261 277   BMET  Recent Labs  09/23/13 0505 09/24/13 0406 09/25/13 0400  NA 137 140 137  K 4.4 4.4 4.2  CL 97 99 96  CO2 26 29 30   GLUCOSE 104* 125* 169*  BUN 15 14 17   CREATININE 0.88 0.83 0.93  CALCIUM 9.7 9.3 9.2   LFT  Recent Labs  09/23/13 0505  PROT 7.3  ALBUMIN 2.9*  AST 14  ALT 8  ALKPHOS 57  BILITOT 0.4   PT/INR No results found for this basename: LABPROT, INR,  in the last 72 hours  Studies/Results: No results found.  Impression:  1.  Anemia.  Iron indices most consistent with anemia of chronic disease.  Slow losses from bladder cancer another consideration. 2.  Hemoccult-positive stool.  Anorectal irritation (history of chronic diarrhea) leading consideration.  Doubt, but can't exclude, GI tract source.  Plan:  1.  Endoscopy and colonoscopy for further evaluation. 2.  Risks (bleeding, infection, bowel perforation that could require surgery, sedation-related changes in  cardiopulmonary systems), benefits (identification and possible treatment of source of symptoms, exclusion of certain causes of symptoms), and alternatives (watchful waiting, radiographic imaging studies, empiric medical treatment) of upper endoscopy (EGD) were explained to patient/family in detail and patient wishes to proceed. 3.  Risks (bleeding, infection, bowel perforation that could require surgery, sedation-related changes in cardiopulmonary systems), benefits (identification and possible treatment of source of symptoms, exclusion of certain causes of symptoms), and alternatives (watchful waiting, radiographic imaging studies, empiric medical treatment) of colonoscopy were explained to patient/family in detail and patient wishes to proceed. 4.  If endoscopy and colonoscopy are unrevealing, would continue oral iron therapy and doubt utility, given patient's age and comorbidities, of any further GI tract testing.   LOS: 3 days   Tarron Krolak M  09/25/2013, 3:30 PM

## 2013-09-26 ENCOUNTER — Encounter (HOSPITAL_COMMUNITY)
Admission: EM | Disposition: A | Discharge status: Home Health | Payer: Self-pay | Source: Home / Self Care | Attending: Internal Medicine

## 2013-09-26 ENCOUNTER — Encounter (HOSPITAL_COMMUNITY): Payer: Self-pay

## 2013-09-26 DIAGNOSIS — J449 Chronic obstructive pulmonary disease, unspecified: Secondary | ICD-10-CM

## 2013-09-26 DIAGNOSIS — J471 Bronchiectasis with (acute) exacerbation: Secondary | ICD-10-CM

## 2013-09-26 HISTORY — PX: ESOPHAGOGASTRODUODENOSCOPY: SHX5428

## 2013-09-26 HISTORY — PX: COLONOSCOPY: SHX5424

## 2013-09-26 LAB — BASIC METABOLIC PANEL
BUN: 12 mg/dL (ref 6–23)
CALCIUM: 9.2 mg/dL (ref 8.4–10.5)
CO2: 28 meq/L (ref 19–32)
CREATININE: 0.8 mg/dL (ref 0.50–1.35)
Chloride: 99 mEq/L (ref 96–112)
GFR calc Af Amer: >90 mL/min (ref >90)
GFR calc non Af Amer: 80 mL/min — ABNORMAL LOW (ref >90)
GLUCOSE: 105 mg/dL — AB (ref 70–99)
Potassium: 4.4 mEq/L (ref 3.7–5.3)
SODIUM: 138 meq/L (ref 137–147)

## 2013-09-26 LAB — CBC
HEMATOCRIT: 22.1 % — AB (ref 39.0–52.0)
Hemoglobin: 7.1 g/dL — ABNORMAL LOW (ref 13.0–17.0)
MCH: 31.7 pg (ref 26.0–34.0)
MCHC: 32.1 g/dL (ref 30.0–36.0)
MCV: 98.7 fL (ref 78.0–100.0)
Platelets: 287 10*3/uL (ref 150–400)
RBC: 2.24 MIL/uL — AB (ref 4.22–5.81)
RDW: 19.4 % — ABNORMAL HIGH (ref 11.5–15.5)
WBC: 3.7 10*3/uL — ABNORMAL LOW (ref 4.0–10.5)

## 2013-09-26 LAB — GLUCOSE, CAPILLARY
GLUCOSE-CAPILLARY: 105 mg/dL — AB (ref 70–99)
Glucose-Capillary: 110 mg/dL — ABNORMAL HIGH (ref 70–99)
Glucose-Capillary: 123 mg/dL — ABNORMAL HIGH (ref 70–99)
Glucose-Capillary: 155 mg/dL — ABNORMAL HIGH (ref 70–99)

## 2013-09-26 LAB — URINE MICROSCOPIC-ADD ON

## 2013-09-26 LAB — URINALYSIS, ROUTINE W REFLEX MICROSCOPIC
Bilirubin Urine: NEGATIVE
Glucose, UA: NEGATIVE mg/dL
Ketones, ur: NEGATIVE mg/dL
Nitrite: NEGATIVE
PH: 6 (ref 5.0–8.0)
Protein, ur: NEGATIVE mg/dL
Specific Gravity, Urine: 1.006 (ref 1.005–1.030)
UROBILINOGEN UA: 0.2 mg/dL (ref 0.0–1.0)

## 2013-09-26 SURGERY — EGD (ESOPHAGOGASTRODUODENOSCOPY)
Anesthesia: Moderate Sedation

## 2013-09-26 MED ORDER — MIDAZOLAM HCL 10 MG/2ML IJ SOLN
INTRAMUSCULAR | Status: AC
  Filled 2013-09-26: qty 2

## 2013-09-26 MED ORDER — FENTANYL CITRATE 0.05 MG/ML IJ SOLN
INTRAMUSCULAR | Status: DC | PRN
  Administered 2013-09-26: 25 ug via INTRAVENOUS

## 2013-09-26 MED ORDER — DIPHENHYDRAMINE HCL 50 MG/ML IJ SOLN
INTRAMUSCULAR | Status: AC
  Filled 2013-09-26: qty 1

## 2013-09-26 MED ORDER — MIDAZOLAM HCL 10 MG/2ML IJ SOLN
INTRAMUSCULAR | Status: DC | PRN
  Administered 2013-09-26: 2 mg via INTRAVENOUS
  Administered 2013-09-26: 1 mg via INTRAVENOUS

## 2013-09-26 MED ORDER — FENTANYL CITRATE 0.05 MG/ML IJ SOLN
INTRAMUSCULAR | Status: AC
  Filled 2013-09-26: qty 2

## 2013-09-26 MED ORDER — BUTAMBEN-TETRACAINE-BENZOCAINE 2-2-14 % EX AERO
INHALATION_SPRAY | CUTANEOUS | Status: DC | PRN
  Administered 2013-09-26: 2 via TOPICAL

## 2013-09-26 NOTE — Interval H&P Note (Signed)
History and Physical Interval Note:  09/26/2013 12:24 PM  Patrick Lane  has presented today for surgery, with the diagnosis of anemia, hemoccult-positive stool  The various methods of treatment have been discussed with the patient and family. After consideration of risks, benefits and other options for treatment, the patient has consented to  Procedure(s): ESOPHAGOGASTRODUODENOSCOPY (EGD) (Left) COLONOSCOPY (N/A) as a surgical intervention .  The patient's history has been reviewed, patient examined, no change in status, stable for surgery.  I have reviewed the patient's chart and labs.  Questions were answered to the patient's satisfaction.     Patrick Lane  Assessment:  1.  Anemia. 2.  Hemoccult-positive stools.  Plan:  1.  Endoscopy. 2.  Risks (bleeding, infection, bowel perforation that could require surgery, sedation-related changes in cardiopulmonary systems), benefits (identification and possible treatment of source of symptoms, exclusion of certain causes of symptoms), and alternatives (watchful waiting, radiographic imaging studies, empiric medical treatment) of upper endoscopy (EGD) were explained to patient/family in detail and patient wishes to proceed. 3.  Colonoscopy. 4.  Risks (bleeding, infection, bowel perforation that could require surgery, sedation-related changes in cardiopulmonary systems), benefits (identification and possible treatment of source of symptoms, exclusion of certain causes of symptoms), and alternatives (watchful waiting, radiographic imaging studies, empiric medical treatment) of colonoscopy were explained to patient/family in detail and patient wishes to proceed.

## 2013-09-26 NOTE — Op Note (Signed)
[facility] [place], [address]   ENDOSCOPY PROCEDURE REPORT  PATIENT: Patrick Lane, Patrick Lane  MR#: [id number] BIRTHDATE: 01-23-1928 , 85  yrs. old GENDER: Male ENDOSCOPIST: Arta Silence, MD REFERRED BY:  [facility] PROCEDURE DATE:  09/26/2013 PROCEDURE:  EGD, diagnostic ASA CLASS:     Class III INDICATIONS:  anemia, hemoccult-positive stool. MEDICATIONS: Fentanyl 25 mcg IV and Versed 3 mg IV TOPICAL ANESTHETIC: Cetacaine Spray  DESCRIPTION OF PROCEDURE: After the risks benefits and alternatives of the procedure were thoroughly explained, informed consent was obtained.  The [place] [id number] endoscope was introduced through the mouth and advanced to the second portion of the duodenum. Without limitations.  The instrument was slowly withdrawn as the mucosa was fully examined.    Findings:  Normal esophagus.  Mild patchy gastritis, worse in fundus and antrum.  Otherwise normal endoscopy to the second portion of the duodenum.  There was no old or fresh blood to the extent of our examination.  No  ulcer, mass, AVM was seen to the extent of our examination.            The scope was then withdrawn from the patient and the procedure completed.  ENDOSCOPIC IMPRESSION:     As above.  No source of guaiac-positive anemia was identified.  RECOMMENDATIONS:     1.  Watch for potential complications of procedure. 2.  Proceed with colonoscopy.  eSigned:  Arta Silence, MD 09/26/2013 1:09 PM   CC:

## 2013-09-26 NOTE — Progress Notes (Signed)
PT Cancellation Note  Patient Details Name: Patrick Lane MRN: [id number] DOB: March 30, 1928   Cancelled Treatment:    Reason Eval/Treat Not Completed: Fatigue/lethargy limiting ability to participate--pt requested rest/meal this afternoon. Agreeable to PT checking back on tomorrow.    Weston Anna, MPT Pager: [phone]

## 2013-09-26 NOTE — H&P (View-Only) (Signed)
[facility] Consultation Note  Referring Provider:  Dr. Rebecca Eaton Magick-Myers Primary Care Physician:  Donnie Coffin, MD Primary Gastroenterologist:  Dr. Ronald Lobo  Reason for Consultation:  Anemia, hemoccult-positive stool  HPI: Patrick Lane is a 80 y.o. male whom I've been asked to see for anemia and hemoccult-positive stool.  Recent pneumonia in February, which patient tells me has essentially resolved.  Was admitted for progressive shortness of breath and weakness, with decrease in baseline hemoglobin.  Dark stools, but denies hematemesis, melena, hematochezia.  No abdominal pain.  Stools hemoccult-positive.  No dysphagia.  Recent treatment (radiation) for bladder cancer, no gross hematuria.  Has had several colonoscopies by Dr. Cristina Gong, last May 2012, with multiple polyps seen and three year recall suggested.  Don't see any prior endoscopies on our [facility] record.  Patient has chronic diarrhea, and random colonic biopsies during his May 2012 colonoscopy showed no evidence of microscopic colitis.  Has lost a lot of weight recently in midst of his bladder cancer, but he tells me his appetite is picking up and he's gaining some weight back.   Past Medical History  Diagnosis Date  . COPD (chronic obstructive pulmonary disease)   . Hyperlipidemia   . Type 2 diabetes mellitus   . Adult bronchiectasis   . Meatal stenosis   . History of atrial fibrillation without current medication     POST OP EPISODE 2004   . AAA (abdominal aortic aneurysm)     LAST DUPLEX ULTRASOUND 06-22-2010  3.7x3.6  . Productive cough   . Dyspnea on exertion   . GERD (gastroesophageal reflux disease)   . History of gastric ulcer   . Foot ulcer, left     BOTTOM OF LITTLE TOE , MONITORED BY PCP  DSG DAILY W/ ANTIBIOTIC OINTMENT AND EPSOM SALT SOAKS  . Arthritis   . Bursitis of shoulder     BOTH  . Frequency of urination   . Urgency of urination   . Nocturia   . Wears glasses   .  Bladder cancer     DX  OCT 2014 W/ INVASIVE HIGH GRADE UROTHELIAL BLADDER CARCINOMA (ONCOLOGIST--  DR Alen Blew)    Past Surgical History  Procedure Laterality Date  . Total knee arthroplasty Right 12-05-2002  . Excision right saphenous neuroma/ right knee arthroscopic lysis adhesions and manipulation  05-29-2003  . Shoulder arthroscopy with rotator cuff repair and subacromial decompression Left 03-02-2005    RESECTION OPEN DISTAL CLAVICLE   . Orif compound right femur fx  1970'S    AND RIGHT PATELLECTOMY /  HARDWARE REMOVED LATER  . Transthoracic echocardiogram  12-07-2002    NORMAL LV/ EF 55-65%  . Appendectomy  AGE 52  . Cataract extraction w/ intraocular lens  implant, bilateral    . Retinal detachment surgery Right 1980'S  . Cystoscopy with urethral dilatation N/A 05/14/2013    Procedure: CYSTOSCOPY WITH MEATAL DILATION ;  Surgeon: Bernestine Amass, MD;  Location: [facility];  Service: Urology;  Laterality: N/A;  . Circumcision N/A 05/14/2013    Procedure: CIRCUMCISION ADULT;  Surgeon: Bernestine Amass, MD;  Location: [facility];  Service: Urology;  Laterality: N/A;  . Transurethral resection of bladder tumor N/A 05/14/2013    Procedure: TRANSURETHRAL RESECTION OF BLADDER TUMOR (TURBT);  Surgeon: Bernestine Amass, MD;  Location: [facility];  Service: Urology;  Laterality: N/A;  . Transurethral resection of bladder tumor N/A 06/25/2013    Procedure: REPEAT TRANSURETHRAL RESECTION OF BLADDER  TUMOR (TURBT);  Surgeon: Bernestine Amass, MD;  Location: [facility];  Service: Urology;  Laterality: N/A;    Prior to Admission medications   Medication Sig Start Date End Date Taking? Authorizing Provider  Alum & Mag Hydroxide-Simeth (MAGIC MOUTHWASH W/LIDOCAINE) SOLN Take 10 mLs by mouth 4 (four) times daily as needed for mouth pain. 09/03/13  Yes Lora Paula, MD  AVODART 0.5 MG capsule Take 0.5 mg by mouth every morning.  09/17/11  Yes  Historical Provider, MD  budesonide-formoterol (SYMBICORT) 80-4.5 MCG/ACT inhaler Inhale 2 puffs into the lungs 2 (two) times daily. 08/31/13  Yes Janece Canterbury, MD  COMBIVENT RESPIMAT 20-100 MCG/ACT AERS Take 2 puffs by mouth every 6 (six) hours as needed for wheezing.  10/04/11  Yes Historical Provider, MD  dextromethorphan-guaiFENesin (MUCINEX DM) 30-600 MG per 12 hr tablet Take 2 tablets by mouth 2 (two) times daily. 08/31/13  Yes Janece Canterbury, MD  diphenoxylate-atropine (LOMOTIL) 2.5-0.025 MG per tablet Take 1-2 tablets by mouth 4 (four) times daily as needed for diarrhea or loose stools. 09/03/13  Yes Lora Paula, MD  feeding supplement, GLUCERNA SHAKE, (GLUCERNA SHAKE) LIQD Take 237 mLs by mouth 3 (three) times daily between meals. 08/31/13  Yes Janece Canterbury, MD  HYDROcodone-acetaminophen (NORCO/VICODIN) 5-325 MG per tablet Take 1-2 tablets by mouth every 4 (four) hours as needed for moderate pain.   Yes Historical Provider, MD  metFORMIN (GLUCOPHAGE) 500 MG tablet Take 1,000 mg by mouth 2 (two) times daily with a meal.   Yes Historical Provider, MD  Multiple Vitamin (MULTIVITAMIN) tablet Take 1 tablet by mouth daily.   Yes Historical Provider, MD  oxybutynin (DITROPAN XL) 10 MG 24 hr tablet Take 1 tablet (10 mg total) by mouth daily. 09/14/13  Yes Lora Paula, MD  saccharomyces boulardii (FLORASTOR) 250 MG capsule Take 1 capsule (250 mg total) by mouth 2 (two) times daily. 08/31/13  Yes Janece Canterbury, MD    Current Facility-Administered Medications  Medication Dose Route Frequency Provider Last Rate Last Dose  . 0.9 %  sodium chloride infusion  250 mL Intravenous PRN Toy Baker, MD      . acetaminophen (TYLENOL) tablet 650 mg  650 mg Oral Q6H PRN Toy Baker, MD       Or  . acetaminophen (TYLENOL) suppository 650 mg  650 mg Rectal Q6H PRN Toy Baker, MD      . budesonide-formoterol (SYMBICORT) 80-4.5 MCG/ACT inhaler 2 puff  2 puff Inhalation BID  Toy Baker, MD   2 puff at 09/25/13 0900  . dextromethorphan-guaiFENesin (MUCINEX DM) 30-600 MG per 12 hr tablet 2 tablet  2 tablet Oral BID Toy Baker, MD   2 tablet at 09/25/13 1048  . dutasteride (AVODART) capsule 0.5 mg  0.5 mg Oral q morning - 10a Toy Baker, MD   0.5 mg at 09/25/13 1048  . feeding supplement (GLUCERNA SHAKE) (GLUCERNA SHAKE) liquid 237 mL  237 mL Oral TID BM Anastassia Doutova, MD   237 mL at 09/25/13 1049  . ferrous sulfate tablet 325 mg  325 mg Oral BID WC Theodis Blaze, MD   325 mg at 09/25/13 0804  . HYDROcodone-acetaminophen (NORCO/VICODIN) 5-325 MG per tablet 1-2 tablet  1-2 tablet Oral Q4H PRN Toy Baker, MD   2 tablet at 09/24/13 2000  . insulin aspart (novoLOG) injection 0-15 Units  0-15 Units Subcutaneous TID WC Toy Baker, MD   3 Units at 09/25/13 1207  . insulin aspart (novoLOG) injection 0-5 Units  0-5 Units Subcutaneous QHS Toy Baker, MD      . ipratropium-albuterol (DUONEB) 0.5-2.5 (3) MG/3ML nebulizer solution 3 mL  3 mL Nebulization TID Theodis Blaze, MD   3 mL at 09/25/13 1422  . ipratropium-albuterol (DUONEB) 0.5-2.5 (3) MG/3ML nebulizer solution 3 mL  3 mL Nebulization Q6H PRN Theodis Blaze, MD      . levalbuterol Penne Lash) nebulizer solution 0.63 mg  0.63 mg Nebulization Q4H PRN Theodis Blaze, MD      . levofloxacin [facility]) tablet 750 mg  750 mg Oral Daily Toy Baker, MD   750 mg at 09/25/13 1048  . ondansetron (ZOFRAN) tablet 4 mg  4 mg Oral Q6H PRN Toy Baker, MD       Or  . ondansetron (ZOFRAN) injection 4 mg  4 mg Intravenous Q6H PRN Toy Baker, MD      . oxybutynin (DITROPAN-XL) 24 hr tablet 10 mg  10 mg Oral Daily Toy Baker, MD   10 mg at 09/25/13 1048  . saccharomyces boulardii (FLORASTOR) capsule 250 mg  250 mg Oral BID Toy Baker, MD   250 mg at 09/25/13 1048  . sodium chloride 0.9 % injection 3 mL  3 mL Intravenous Q12H Anastassia Doutova, MD      .  sodium chloride 0.9 % injection 3 mL  3 mL Intravenous Q12H Anastassia Doutova, MD      . sodium chloride 0.9 % injection 3 mL  3 mL Intravenous PRN Toy Baker, MD      . zolpidem (AMBIEN) tablet 5 mg  5 mg Oral Once Jeryl Columbia, NP        Allergies as of 09/22/2013 - Review Complete 09/22/2013  Allergen Reaction Noted  . Celebrex [celecoxib] Hives 05/08/2013    Family History  Problem Relation Age of Onset  . Heart disease Mother   . Cancer Mother     and 3 sisters and 2 brothers but unsure of type.    History   Social History  . Marital Status: Widowed    Spouse Name: N/A    Number of Children: 1  . Years of Education: N/A   Occupational History  . Retired Dealer   .     Social History Main Topics  . Smoking status: Former Smoker -- 1.50 packs/day for 70 years    Types: Cigarettes    Quit date: 07/20/1999  . Smokeless tobacco: Former Systems developer    Types: Chew  . Alcohol Use: No  . Drug Use: No  . Sexual Activity: Not on file   Other Topics Concern  . Not on file   Social History Narrative   Widowed.  Lives with a friend and has other family who help provide his care.  Normally ambulates independently.    Review of Systems: ROS Dr. Roel Cluck 09/23/13 reviewed and I agree  Physical Exam: Vital signs in last 24 hours: Temp:  [97.7 F (36.5 C)-98.7 F (37.1 C)] 97.7 F (36.5 C) (03/10 1300) Pulse Rate:  [106-112] 106 (03/10 1300) Resp:  [20-22] 20 (03/10 1300) BP: (107-139)/(39-66) 127/66 mmHg (03/10 1300) SpO2:  [95 %-99 %] 96 % (03/10 1422) Last BM Date: 09/24/13 General:   Alert,  Well-developed, well-nourished, elderly but non-toxic appearing is in no acute distress Head:  Normocephalic and atraumatic. Eyes:  Sclera clear, no icterus.   Conjunctiva pink. Ears:  Normal auditory acuity. Nose:  No deformity, discharge,  or lesions. Mouth:  No deformity or lesions.  Oropharynx pink & moist.  Neck:  Supple; no masses or thyromegaly. Lungs:   Diffusely diminished breath sounds, seemingly chronic, otherwise clear throughout to auscultation.   No wheezes, crackles, or rhonchi. No acute distress. Heart:  Regular rate and rhythm; no murmurs, clicks, rubs,  or gallops. Abdomen:  Soft, nontender and nondistended. No masses, hepatosplenomegaly or hernias noted. Normal bowel sounds, without guarding, and without rebound.     Msk:  Diffuse muscular atrophy, otherwise symmetrical without gross deformities. Normal posture. Pulses:  Normal pulses noted. Extremities:  Without clubbing or edema. Neurologic:  Alert and  oriented x4;  Diffusely weak, otherwise grossly normal neurologically. Skin:  Scattered small ecchymoses, otherwise intact without significant lesions or rashes. Cervical Nodes:  No significant cervical adenopathy. Psych:  Alert and cooperative. Normal mood and affect.   Lab Results:  Recent Labs  09/23/13 0505 09/24/13 0406 09/25/13 0400  WBC 3.7* 3.3* 3.6*  HGB 8.1* 7.4* 7.1*  HCT 24.3* 22.9* 21.7*  PLT 289 261 277   BMET  Recent Labs  09/23/13 0505 09/24/13 0406 09/25/13 0400  NA 137 140 137  K 4.4 4.4 4.2  CL 97 99 96  CO2 26 29 30   GLUCOSE 104* 125* 169*  BUN 15 14 17   CREATININE 0.88 0.83 0.93  CALCIUM 9.7 9.3 9.2   LFT  Recent Labs  09/23/13 0505  PROT 7.3  ALBUMIN 2.9*  AST 14  ALT 8  ALKPHOS 57  BILITOT 0.4   PT/INR No results found for this basename: LABPROT, INR,  in the last 72 hours  Studies/Results: No results found.  Impression:  1.  Anemia.  Iron indices most consistent with anemia of chronic disease.  Slow losses from bladder cancer another consideration. 2.  Hemoccult-positive stool.  Anorectal irritation (history of chronic diarrhea) leading consideration.  Doubt, but can't exclude, GI tract source.  Plan:  1.  Endoscopy and colonoscopy for further evaluation. 2.  Risks (bleeding, infection, bowel perforation that could require surgery, sedation-related changes in  cardiopulmonary systems), benefits (identification and possible treatment of source of symptoms, exclusion of certain causes of symptoms), and alternatives (watchful waiting, radiographic imaging studies, empiric medical treatment) of upper endoscopy (EGD) were explained to patient/family in detail and patient wishes to proceed. 3.  Risks (bleeding, infection, bowel perforation that could require surgery, sedation-related changes in cardiopulmonary systems), benefits (identification and possible treatment of source of symptoms, exclusion of certain causes of symptoms), and alternatives (watchful waiting, radiographic imaging studies, empiric medical treatment) of colonoscopy were explained to patient/family in detail and patient wishes to proceed. 4.  If endoscopy and colonoscopy are unrevealing, would continue oral iron therapy and doubt utility, given patient's age and comorbidities, of any further GI tract testing.   LOS: 3 days   Jailon Schaible M  09/25/2013, 3:30 PM

## 2013-09-26 NOTE — Progress Notes (Signed)
[facility] PROGRESS NOTE  Patrick Lane ERX:[id number] DOB: 01/01/1928 DOA: 09/22/2013 PCP: Donnie Coffin, MD  Assessment/Plan: 80 y.o. male with COPD, HLD, DM type II, a-fib in the past but on on treatment, recent admission in Feb 2015 for PNA, recent radiation therapy for bladder cancer, presented to [facility] ED with main concern of several days duration of progressively worsening shortness of breath, worse with exertion and better with rest, associated with cough productive of yellow sputum, subjective fevers, chills, malaise.   1. Acute hypoxic respiratory failure  - secondary to COPD and bronchitis exacerbation  - pt is clinically improving but hypoxic with ambulation, Oxygen dropped to high 70's with ambulation without oxygen  - likely need oxygen upon discharge  - continue Levaquin, follow up on sputum analysis which is negative to date  2. Malignant neoplasm of lateral wall of urinary bladder  - undergoing radiation therapy  3. Normocytic anemia  - slight drop in Hg over the past 24 hours but compared to recent Hg values it is down by 2-3 points  - Hg several months back was 10-11  - FOBT positive and iron is low  - 3/11: s/p EGD: gastritis;colonoascopy: Mild external hemorrhoids; no obvious source for bleeding;  - monitor Hg, TF prn; cont PO iron  4.Type 2 diabetes mellitus  - A1C is 6  - reasonable inpatient control  5. Protein-calorie malnutrition, severe  - advance diet as pt is able to tolerate  6. COPD/Bronchiectasis with acute exacerbation  - management with BD's as needed and scheduled , ABX as noted above     Consultants:  GI Procedures/Studies:  Dg Chest 2 View 09/22/2013 There is hyperinflation consistent with COPD. There is no alveolar pneumonia. Lower lobe increased density is not new and is consistent with bronchiectasis. There is no evidence of CHF. Antibiotics:  Levaquin 3/8 -->    Code Status: full Family Communication: d/w patient  (indicate person  spoken with, relationship, and if by phone, the number) Disposition Plan: home 2-3 days    Consultants:  GI  Procedures:  EGD, colon    HPI/Subjective: alert  Objective: Filed Vitals:   09/26/13 1350  BP: 114/56  Pulse: 98  Temp: 97.6 F (36.4 C)  Resp: 18    Intake/Output Summary (Last 24 hours) at 09/26/13 1416 Last data filed at 09/26/13 0700  Gross per 24 hour  Intake 299.33 ml  Output      0 ml  Net 299.33 ml   Filed Weights   09/23/13 0420 09/24/13 0900  Weight: 83.3 kg (183 lb 10.3 oz) 83.3 kg (183 lb 10.3 oz)    Exam:   General:  alert  Cardiovascular: s1,s2 rrr  Respiratory: BL LL rales   Abdomen: soft, nt,nd   Musculoskeletal: no LE edema    Data Reviewed: Basic Metabolic Panel:  Recent Labs Lab 09/22/13 2038 09/23/13 0505 09/24/13 0406 09/25/13 0400 09/26/13 0400  NA 137 137 140 137 138  K 4.5 4.4 4.4 4.2 4.4  CL 95* 97 99 96 99  CO2 25 26 29 30 28   GLUCOSE 129* 104* 125* 169* 105*  BUN 14 15 14 17 12   CREATININE 0.87 0.88 0.83 0.93 0.80  CALCIUM 9.7 9.7 9.3 9.2 9.2  MG  --  1.8  --   --   --   PHOS  --  3.6  --   --   --    Liver Function Tests:  Recent Labs Lab 09/23/13 0505  AST 14  ALT  8  ALKPHOS 57  BILITOT 0.4  PROT 7.3  ALBUMIN 2.9*   No results found for this basename: LIPASE, AMYLASE,  in the last 168 hours No results found for this basename: AMMONIA,  in the last 168 hours CBC:  Recent Labs Lab 09/22/13 2038 09/23/13 0505 09/24/13 0406 09/25/13 0400 09/26/13 0400  WBC 4.4 3.7* 3.3* 3.6* 3.7*  NEUTROABS 2.7  --   --   --   --   HGB 8.7* 8.1* 7.4* 7.1* 7.1*  HCT 26.6* 24.3* 22.9* 21.7* 22.1*  MCV 96.7 96.0 97.4 97.7 98.7  PLT 307 289 261 277 287   Cardiac Enzymes:  Recent Labs Lab 09/23/13 0008 09/23/13 0505 09/23/13 1050 09/23/13 1750  TROPONINI <0.30 <0.30 <0.30 <0.30   BNP (last 3 results) No results found for this basename: PROBNP,  in the last 8760 hours CBG:  Recent Labs Lab  09/25/13 0729 09/25/13 1153 09/25/13 1638 09/25/13 2119 09/26/13 0736  GLUCAP 134* 157* 168* 107* 110*    No results found for this or any previous visit (from the past 240 hour(s)).   Studies: No results found.  Scheduled Meds: . budesonide-formoterol  2 puff Inhalation BID  . dextromethorphan-guaiFENesin  2 tablet Oral BID  . dutasteride  0.5 mg Oral q morning - 10a  . feeding supplement (GLUCERNA SHAKE)  237 mL Oral TID BM  . ferrous sulfate  325 mg Oral BID WC  . insulin aspart  0-15 Units Subcutaneous TID WC  . insulin aspart  0-5 Units Subcutaneous QHS  . ipratropium-albuterol  3 mL Nebulization TID  . levofloxacin  750 mg Oral Daily  . oxybutynin  10 mg Oral Daily  . saccharomyces boulardii  250 mg Oral BID  . sodium chloride  3 mL Intravenous Q12H  . sodium chloride  3 mL Intravenous Q12H  . zolpidem  5 mg Oral Once   Continuous Infusions:   Active Problems:   COPD (chronic obstructive pulmonary disease) with chronic bronchitis and bronchiectasis with acute exacerbation   Malignant neoplasm of lateral wall of urinary bladder   Normocytic anemia   Type 2 diabetes mellitus   Protein-calorie malnutrition, severe   Bronchiectasis with acute exacerbation   Chest pain on breathing   Malnutrition of moderate degree    Time spent: >35    Kinnie Feil  [facility] Pager [phone]. If 7PM-7AM, please contact night-coverage at [email or web address], password [facility] 09/26/2013, 2:16 PM  LOS: 4 days

## 2013-09-26 NOTE — Op Note (Signed)
[facility] [place], [address]   COLONOSCOPY PROCEDURE REPORT  PATIENT: Patrick Lane, Patrick Lane  MR#: [id number] BIRTHDATE: Mar 01, 1928 , 85  yrs. old GENDER: Male ENDOSCOPIST: Arta Silence, MD REFERRED [id number] PROCEDURE DATE:  09/26/2013 PROCEDURE:   Colonoscopy, diagnostic ASA CLASS:   Class III INDICATIONS:hemoccult-positive stool, anemia, personal history of colon polyps. MEDICATIONS: Fentanyl 25 mcg IV and Versed 3 mg IV  DESCRIPTION OF PROCEDURE:   After the risks benefits and alternatives of the procedure were thoroughly explained, informed consent was obtained.  A digital rectal exam revealed external hemorrhoids.   The Pentax Adult Colonscope [id number]  endoscope was introduced through the anus and advanced to the cecum, which was identified by both the appendix and ileocecal valve. No adverse events experienced.   The quality of the prep was adequate.  The instrument was then slowly withdrawn as the colon was fully examined.    Findings:  Mild external hemorrhoids, otherwise normal digital rectal exam.  Few sigmoid diverticula seen.  No other polyps, masses, vascular ectasias, or inflammatory changes were seen.  No old or fresh blood was seen to the extent of our examination.     Normal retroflexed view of rectum   .  The scope was withdrawn and the procedure completed.  ENDOSCOPIC IMPRESSION:     As above.  Small hemorrhoids could lead to hemoccult-positive stool.  No source of anemia was identified. Do not suspect GI tract source of anemia.  RECOMMENDATIONS:     1.  Watch for potential complications of procedure. 2.  Start oral iron for anemia. 3.  Given patient's age and multiple comorbidities, doubt utility of any further GI tract testing (e.g., CT enterography, capsule endoscopy) into patient's anemia. 4.  Will sign-off; please call with questions.  Thank you for consult.  eSigned:  Arta Silence, MD 09/26/2013  1:17 PM   cc:

## 2013-09-27 ENCOUNTER — Encounter (HOSPITAL_COMMUNITY): Payer: Self-pay | Admitting: Gastroenterology

## 2013-09-27 LAB — CBC
HCT: 23.6 % — ABNORMAL LOW (ref 39.0–52.0)
Hemoglobin: 7.5 g/dL — ABNORMAL LOW (ref 13.0–17.0)
MCH: 31.1 pg (ref 26.0–34.0)
MCHC: 31.8 g/dL (ref 30.0–36.0)
MCV: 97.9 fL (ref 78.0–100.0)
PLATELETS: 298 10*3/uL (ref 150–400)
RBC: 2.41 MIL/uL — AB (ref 4.22–5.81)
RDW: 18.9 % — AB (ref 11.5–15.5)
WBC: 3.2 10*3/uL — ABNORMAL LOW (ref 4.0–10.5)

## 2013-09-27 LAB — GLUCOSE, CAPILLARY
Glucose-Capillary: 93 mg/dL (ref 70–99)
Glucose-Capillary: 161 mg/dL — ABNORMAL HIGH (ref 70–99)

## 2013-09-27 MED ORDER — LEVOFLOXACIN 750 MG PO TABS: 750.0000 mg | ORAL_TABLET | Freq: Every day | ORAL | Status: DC

## 2013-09-27 MED ORDER — FERROUS SULFATE 325 (65 FE) MG PO TABS: 325.0000 mg | ORAL_TABLET | Freq: Two times a day (BID) | ORAL | Status: DC

## 2013-09-27 MED ORDER — ZOLPIDEM TARTRATE 5 MG PO TABS
5.0000 mg | ORAL_TABLET | Freq: Once | ORAL | Status: AC
  Administered 2013-09-27: 5 mg via ORAL

## 2013-09-27 NOTE — Progress Notes (Addendum)
Pt will not qualify for Home O2.  O2 Sats RA 94-100%.   Explained this to pt, who states he was glad.

## 2013-09-27 NOTE — Progress Notes (Signed)
SATURATION QUALIFICATIONS: (This note is used to comply with regulatory documentation for home oxygen)  Patient Saturations on Room Air at Rest = 94%  Patient Saturations on Room Air while Ambulating = 100%  Patient Saturations on zero Liters of oxygen while Ambulating = 100%  Please briefly explain why patient needs home oxygen:

## 2013-09-27 NOTE — Progress Notes (Signed)
Spoke with pt concerning Home Health. Pt had no preference, pt agreed with [facility] for HHPT/HHRN [facility] monitoring. Referral called to [facility] referral given to in house rep.

## 2013-09-27 NOTE — Discharge Summary (Signed)
Physician Discharge Summary  Patrick Lane [id number] DOB: 11/29/1927 DOA: 09/22/2013  PCP: Donnie Coffin, MD  Admit date: 09/22/2013 Discharge date: 09/27/2013  Time spent: 35 minutes  Recommendations for Outpatient Follow-up:  1. Follow up with PCP tomorrow  2. Followup with oncology as previously arranged  Recommendations for primary care physician for things to follow:  Repeat CBC  Discharge Diagnoses:  Active Problems:   COPD (chronic obstructive pulmonary disease) with chronic bronchitis and bronchiectasis with acute exacerbation   Malignant neoplasm of lateral wall of urinary bladder   Normocytic anemia   Type 2 diabetes mellitus   Protein-calorie malnutrition, severe   Bronchiectasis with acute exacerbation   Chest pain on breathing   Malnutrition of moderate degree  Discharge Condition: Stable  Diet recommendation: Regular  Filed Weights   09/23/13 0420 09/24/13 0900  Weight: 83.3 kg (183 lb 10.3 oz) 83.3 kg (183 lb 10.3 oz)    History of present illness:  Patrick Lane is a 80 y.o. male  has a past medical history of COPD (chronic obstructive pulmonary disease); Hyperlipidemia; Type 2 diabetes mellitus; Adult bronchiectasis; Meatal stenosis; History of atrial fibrillation without current medication; AAA (abdominal aortic aneurysm); Productive cough; Dyspnea on exertion; GERD (gastroesophageal reflux disease); History of gastric ulcer; Foot ulcer, left; Arthritis; Bursitis of shoulder; Frequency of urination; Urgency of urination; Nocturia; Wears glasses; and Bladder cancer.  Presented with  Patient was admitted from 2/11 -2/13 for PNA since his discharge he was doing well. Patient is recently finished radiation therapy for bladder cancer. Patient has chronic cough but today feels like it gotten worse, green sputum production no fever. Reports pain with coughing and with taking deep breaths. Patient was afraid he was getting PNA and came to ER. He was found  to be tachycardic that is his baseline. Also was found to have lower Hg down to 8.5. Patient denies any blood in stool reports dark stools but not black he is on lomotil to prevent diarrhea. Hospitalist was called for admission   Hospital Course:  Acute hypoxic respiratory failure  - secondary to COPD and bronchitis exacerbation  - pt is clinically improving and was initially hypoxic with ambulation, however on the day of discharge he was able to ambulate without oxygen, maintaining his saturation, does not qualify for home oxygen at this time.  - continue Levaquin, follow up on sputum analysis which is negative to date and he is to complete Levaquin as an outpatient. , Malignant neoplasm of lateral wall of urinary bladder  - undergoing radiation therapy,  Followup as an outpatient Normocytic anemia  - slight drop in Hg over the past 24 hours but compared to recent Hg values it is down by 2-3 points  - Hg several months back was 10-11  - FOBT positive and iron is low  - 3/11: s/p EGD: gastritis;colonoascopy: Mild external hemorrhoids; no obvious source for bleeding;  - monitor Hg, TF prn; cont PO iron  - Hemoglobin is stable on discharge, improved to 7.5 from 7.1 without any transfusions,  - Patient has an appointment with his PCP tomorrow, recommend repeat CBC Type 2 diabetes mellitus  - A1C is 6  - reasonable inpatient control  Protein-calorie malnutrition, severe  - advance diet as pt is able to tolerate  COPD/Bronchiectasis with acute exacerbation  - management with BD's as needed and scheduled , ABX as noted above   Procedures:  Colonoscopy 3/11 ENDOSCOPIC IMPRESSION: Small hemorrhoids could lead to hemoccult-positive stool. No source of anemia  was identified.  Do not suspect GI tract source of anemia.   EGD ENDOSCOPIC IMPRESSION: No source of guaiac-positive anemia was identified.   Consultations:  Gastroenterology   Discharge Exam: Filed Vitals:   09/27/13 0441 09/27/13  0829 09/27/13 1100 09/27/13 1307  BP: 121/53     Pulse: 99     Temp: 98 F (36.7 C)     TempSrc: Oral     Resp: 18     Height:      Weight:      SpO2: 100% 98% 94% 100%    General: No acute distress  Cardiovascular: Regular rate and rhythm  Respiratory: Clear to auscultation   Discharge Instructions   Future Appointments Provider Department Dept Phone   10/11/2013 11:45 AM Oneita Hurt, MD [facility] Rad[facility] [phone]   10/19/2013 12:00 PM Anabel Bene, RD [facility] Medical Oncology [phone]   10/19/2013 12:45 PM [facility] [phone]   10/19/2013 1:15 PM Benjiman Core, MD [facility] Medical Oncology [phone]       Medication List         AVODART 0.5 MG capsule  Generic drug:  dutasteride  Take 0.5 mg by mouth every morning.     budesonide-formoterol 80-4.5 MCG/ACT inhaler  Commonly known as:  SYMBICORT  Inhale 2 puffs into the lungs 2 (two) times daily.     COMBIVENT RESPIMAT 20-100 MCG/ACT Aers respimat  Generic drug:  Ipratropium-Albuterol  Take 2 puffs by mouth every 6 (six) hours as needed for wheezing.     dextromethorphan-guaiFENesin 30-600 MG per 12 hr tablet  Commonly known as:  MUCINEX DM  Take 2 tablets by mouth 2 (two) times daily.     diphenoxylate-atropine 2.5-0.025 MG per tablet  Commonly known as:  LOMOTIL  Take 1-2 tablets by mouth 4 (four) times daily as needed for diarrhea or loose stools.     feeding supplement (GLUCERNA SHAKE) Liqd  Take 237 mLs by mouth 3 (three) times daily between meals.     ferrous sulfate 325 (65 FE) MG tablet  Take 1 tablet (325 mg total) by mouth 2 (two) times daily with a meal.     HYDROcodone-acetaminophen 5-325 MG per tablet  Commonly known as:  NORCO/VICODIN  Take 1-2 tablets by mouth every 4 (four) hours as needed for moderate pain.     levofloxacin 750 MG tablet  Commonly known as:   LEVAQUIN  Take 1 tablet (750 mg total) by mouth daily.     magic mouthwash w/lidocaine Soln  Take 10 mLs by mouth 4 (four) times daily as needed for mouth pain.     metFORMIN 500 MG tablet  Commonly known as:  GLUCOPHAGE  Take 1,000 mg by mouth 2 (two) times daily with a meal.     multivitamin tablet  Take 1 tablet by mouth daily.     oxybutynin 10 MG 24 hr tablet  Commonly known as:  DITROPAN XL  Take 1 tablet (10 mg total) by mouth daily.     saccharomyces boulardii 250 MG capsule  Commonly known as:  FLORASTOR  Take 1 capsule (250 mg total) by mouth 2 (two) times daily.           Follow-up Information   Follow up with [facility], MD In 1 day. (you have an appointment 3/13 as previously arranged)    Specialty:  Family Medicine   Contact information:  [address] [place] [address] [phone]       Follow up with [facility] On 09/28/2013.   Specialty:  Home Health Services   Contact information:   [place] [address] [phone]      The results of significant diagnostics from this hospitalization (including imaging, microbiology, ancillary and laboratory) are listed below for reference.    Significant Diagnostic Studies: Dg Chest 2 View  09/22/2013   CLINICAL DATA:  Cough and congestion, history of COPD and bladder malignancy and diabetes  EXAM: CHEST  2 VIEW  COMPARISON:  Portable chest x-ray and chest CT scan of August 29, 2013.  FINDINGS: The lungs are mildly hyperinflated. There is no focal infiltrate. Coarse increased lung markings in the lower lobes posteriorly are present and likely reflect bronchiectasis as seen on the previous CT scan. The cardiopericardial silhouette is top-normal in size. The pulmonary vascularity is not engorged. There is no pleural effusion. The mediastinum is normal in width. There is mild tortuosity of the descending thoracic aorta. The observed portions of the bony thorax  exhibit no acute abnormalities.  IMPRESSION: There is hyperinflation consistent with COPD. There is no alveolar pneumonia. Lower lobe increased density is not new and is consistent with bronchiectasis. There is no evidence of CHF.   Electronically Signed   By: David  Martinique   On: 09/22/2013 20:56   Ct Chest W Contrast  08/30/2013   CLINICAL DATA:  Pneumonia. Abnormal chest x-ray. History of bladder cancer.  EXAM: CT CHEST WITH CONTRAST  TECHNIQUE: Multidetector CT imaging of the chest was performed during intravenous contrast administration.  CONTRAST:  148mL OMNIPAQUE IOHEXOL 300 MG/ML  SOLN  COMPARISON:  DG CHEST 1V PORT dated 08/29/2013  FINDINGS: Mild emphysema is present in the upper lobes. There is no upper lobe airspace disease. Scattered areas of tree-in-bud micro nodularity or present diffusely in the lower lobes. Additionally, there is bronchial wall thickening compatible with chronic bronchitis. Collapse and some consolidation of both posterior basal lower lobes is present, suggesting chronic infection including atypical such as mycobacterium avium. Mycobacterium avium is commonly associated with the tree in bud pattern although the distribution is somewhat atypical with sparing of the anterior basis. Coronary artery atherosclerosis is present. If office based assessment of coronary risk factors has not been performed, it is now recommended. There is no pericardial effusion. No pleural effusion. Incidental imaging of the upper abdomen is within normal limits. Tiny calcification is present along the superior gastric fundus. Aortic and branch vessel atherosclerosis is present with shaggy mural plaque extending into the upper abdomen. Old granulomatous disease of the liver. Osteopenia is present. No destructive osseous lesions of the thoracic spine. The sternum appears within normal limits.  There is no axillary adenopathy. Bilateral hilar adenopathy is present, probably reactive. Right hilar lymph node  measures 16 mm short axis (image 34 series 2). Left hilar lymph node measures 11 mm short axis (image 30 series 2).  Notably, the opacity at the apices on radiography was due in part to projection and extensive costochondral calcification. The only architectural distortion of the pulmonary parenchyma at the apices is due to emphysema.  IMPRESSION: 1. Bilateral lower lobe bronchial wall thickening and tree-in-bud micronodularity. This is most commonly associated with infection but can also be seen in chronic aspiration. Mycobacterium avium is in the differential considerations although the pattern and distribution is somewhat atypical. 2. Emphysema. 3. Bilateral hilar adenopathy is probably reactive. 4. Atherosclerosis and  coronary artery disease.   Electronically Signed   By: Dereck Ligas M.D.   On: 08/30/2013 01:30   Dg Chest Port 1 View  08/29/2013   CLINICAL DATA:  Shortness of breath.  EXAM: PORTABLE CHEST - 1 VIEW  COMPARISON:  DG CHEST 2 VIEW dated 05/14/2013; DG CHEST 2V dated 05/24/2012; CT CHEST W/CM dated 12/07/2002; CT ANGIO CHEST dated 03/05/2005  FINDINGS: Ill-defined nodularity in both upper lobes the. Linear reticular opacities at the lung bases bilaterally. Possible emphysema.  No paratracheal adenopathy observed. Atherosclerotic aortic arch. Subtly blunted left lateral costophrenic angle.  IMPRESSION: 1. Vague nodularity in the upper lobes. I cannot exclude neoplastic pulmonary nodules although much of this may be due to superimposed vascular and osseous structures. Chest CT (with contrast if feasible) is recommended for further characterization. 2. Subtle blunting of the left lateral costophrenic angle suggesting small left pleural effusion. 3. Faint reticular interstitial accentuation in the lung bases, nonspecific but possibly due to low grade inflammation or drug reaction. 4. Atherosclerotic aortic arch. 5. Suspected emphysema. These results will be called to the ordering clinician or  representative by the Radiologist Assistant, and communication documented in the PACS Dashboard.   Electronically Signed   By: Sherryl Barters M.D.   On: 08/29/2013 16:35    Microbiology: No results found for this or any previous visit (from the past 240 hour(s)).   Labs: Basic Metabolic Panel:  Recent Labs Lab 09/22/13 2038 09/23/13 0505 09/24/13 0406 09/25/13 0400 09/26/13 0400  NA 137 137 140 137 138  K 4.5 4.4 4.4 4.2 4.4  CL 95* 97 99 96 99  CO2 25 26 29 30 28   GLUCOSE 129* 104* 125* 169* 105*  BUN 14 15 14 17 12   CREATININE 0.87 0.88 0.83 0.93 0.80  CALCIUM 9.7 9.7 9.3 9.2 9.2  MG  --  1.8  --   --   --   PHOS  --  3.6  --   --   --    Liver Function Tests:  Recent Labs Lab 09/23/13 0505  AST 14  ALT 8  ALKPHOS 57  BILITOT 0.4  PROT 7.3  ALBUMIN 2.9*   No results found for this basename: LIPASE, AMYLASE,  in the last 168 hours No results found for this basename: AMMONIA,  in the last 168 hours CBC:  Recent Labs Lab 09/22/13 2038 09/23/13 0505 09/24/13 0406 09/25/13 0400 09/26/13 0400 09/27/13 0415  WBC 4.4 3.7* 3.3* 3.6* 3.7* 3.2*  NEUTROABS 2.7  --   --   --   --   --   HGB 8.7* 8.1* 7.4* 7.1* 7.1* 7.5*  HCT 26.6* 24.3* 22.9* 21.7* 22.1* 23.6*  MCV 96.7 96.0 97.4 97.7 98.7 97.9  PLT 307 289 261 277 287 298   Cardiac Enzymes:  Recent Labs Lab 09/23/13 0008 09/23/13 0505 09/23/13 1050 09/23/13 1750  TROPONINI <0.30 <0.30 <0.30 <0.30   CBG:  Recent Labs Lab 09/26/13 1520 09/26/13 1817 09/26/13 2145 09/27/13 0738 09/27/13 1132  GLUCAP 105* 123* 155* 93 161*     Signed:  Leahann Lempke  [facility] 09/27/2013, 4:24 PM

## 2013-09-27 NOTE — Progress Notes (Signed)
Physical Therapy Treatment Patient Details Name: Patrick Lane MRN: [id number] DOB: March 22, 1928 Today's Date: 09/27/2013 Time: 4193-7902 PT Time Calculation (min): 37 min SATURATION QUALIFICATIONS: (This note is used to comply with regulatory documentation for home oxygen)  Patient Saturations on Room Air at Rest = 94%  Patient Saturations on Room Air while Ambulating =88-94% (unsure of accuracy due to fluctuations)   PT Assessment / Plan / Recommendation  History of Present Illness 80 year old gentleman here with history of bladder cancer, bronchiectasis and type 2 diabetes presenting with bronchiectasis exacerbation.  Patient was admitted from 2/11 -2/13 for PNA since his discharge he was doing well. Patient is recently finished radiation therapy for bladder cancer. Patient has chronic cough but today feels like it gotten worse, green sputum production no fever. Reports pain with coughing and with taking deep breaths. Patient was afraid he was getting PNA and came to ER. He was found to be tachycardic that is his baseline.  Also was found to have lower Hg down to 8.5. Patient denies any blood in stool reports dark stools but not black he is on lomotil to prevent diarrhea. Hospitalist was called for admission   PT Comments   Pt agreeable to ambulate today. Difficulty getting good reading of O2 sat level due to pt with significant tremor (dynamap did not maintain consistent reading).   Follow Up Recommendations  Home health PT;Supervision for mobility/OOB     Does the patient have the potential to tolerate intense rehabilitation     Barriers to Discharge        Equipment Recommendations  None recommended by PT    Recommendations for Other Services    Frequency Min 3X/week   Progress towards PT Goals Progress towards PT goals: Progressing toward goals  Plan Current plan remains appropriate    Precautions / Restrictions Precautions Precautions: Fall Precaution Comments: monitor  vitals Restrictions Weight Bearing Restrictions: No   Pertinent Vitals/Pain 94% RA at rest; HR 101 bpm 88-94% on RA with activity; HR 121-134 bpm  (Made MD aware)    Mobility  Bed Mobility Overal bed mobility: Modified Independent Bed Mobility: Supine to Sit;Sit to Supine Supine to sit: Modified independent (Device/Increase time) Sit to supine: Modified independent (Device/Increase time) Transfers Overall transfer level: Needs assistance Equipment used: Rolling walker (2 wheeled) Sit to Stand: Min assist General transfer comment: Assist to rise, stabilize, control descent. VCs safety, hand placement.  Ambulation/Gait Ambulation/Gait assistance: Min guard Ambulation Distance (Feet): 150 Feet Assistive device: Rolling walker (2 wheeled) Gait Pattern/deviations: Trunk flexed;Step-through pattern;Decreased stride length General Gait Details: O2 sats reading fluctuated between 88-94% on RA during ambulation. slow gait speed. dyspnea 2/4    Exercises     PT Diagnosis:    PT Problem List:   PT Treatment Interventions:     PT Goals (current goals can now be found in the care plan section)    Visit Information  Last PT Received On: 09/27/13 Assistance Needed: +1 History of Present Illness: 80 year old gentleman here with history of bladder cancer, bronchiectasis and type 2 diabetes presenting with bronchiectasis exacerbation.  Patient was admitted from 2/11 -2/13 for PNA since his discharge he was doing well. Patient is recently finished radiation therapy for bladder cancer. Patient has chronic cough but today feels like it gotten worse, green sputum production no fever. Reports pain with coughing and with taking deep breaths. Patient was afraid he was getting PNA and came to ER. He was found to be tachycardic that is his baseline.  Also was found to have lower Hg down to 8.5. Patient denies any blood in stool reports dark stools but not black he is on lomotil to prevent diarrhea.  Hospitalist was called for admission    Subjective Data      Cognition  Cognition Arousal/Alertness: Awake/alert Behavior During Therapy: WFL for tasks assessed/performed Overall Cognitive Status: Within Functional Limits for tasks assessed    Balance     End of Session PT - End of Session Activity Tolerance: Patient tolerated treatment well Patient left: in bed;with call bell/phone within reach   GP     Weston Anna, MPT Pager: [phone]

## 2013-09-27 NOTE — Discharge Instructions (Signed)
You were cared for by a hospitalist during your hospital stay. If you have any questions about your discharge medications or the care you received while you were in the hospital after you are discharged, you can call the unit and asked to speak with the hospitalist on call if the hospitalist that took care of you is not available. Once you are discharged, your primary care physician will handle any further medical issues. Please note that NO REFILLS for any discharge medications will be authorized once you are discharged, as it is imperative that you return to your primary care physician (or establish a relationship with a primary care physician if you do not have one) for your aftercare needs so that they can reassess your need for medications and monitor your lab values.     If you do not have a primary care physician, you can call [phone] for a physician referral.  Follow with Primary MD Donnie Coffin, MD in 1 days   Get CBC, CMP checked by your doctor and again as further instructed.  Get a 2 view Chest X ray done next visit if you had Pneumonia of Lung problems at the [facility] reviewed and adjusted.  Please request your Prim.MD to go over all Hospital Tests and Procedure/Radiological results at the follow up, please get all Hospital records sent to your Prim MD by signing hospital release before you go home.  Activity: As tolerated with Full fall precautions use walker/cane & assistance as needed  Diet: regular  For Heart failure patients - Check your Weight same time everyday, if you gain over 2 pounds, or you develop in leg swelling, experience more shortness of breath or chest pain, call your Primary MD immediately. Follow Cardiac Low Salt Diet and 1.8 lit/day fluid restriction.  Disposition Home with HHPT  If you experience worsening of your admission symptoms, develop shortness of breath, life threatening emergency, suicidal or homicidal thoughts you must seek medical  attention immediately by calling 911 or calling your MD immediately  if symptoms less severe.  You Must read complete instructions/literature along with all the possible adverse reactions/side effects for all the Medicines you take and that have been prescribed to you. Take any new Medicines after you have completely understood and accpet all the possible adverse reactions/side effects.   Do not drive and provide baby sitting services if your were admitted for syncope or siezures until you have seen by Primary MD or a Neurologist and advised to do so again.  Do not drive when taking Pain medications.   Do not take more than prescribed Pain, Sleep and Anxiety Medications  Special Instructions: If you have smoked or chewed Tobacco  in the last 2 yrs please stop smoking, stop any regular Alcohol  and or any Recreational drug use.  Wear Seat belts while driving.

## 2013-10-01 ENCOUNTER — Telehealth: Payer: Self-pay | Admitting: Oncology

## 2013-10-01 NOTE — Telephone Encounter (Signed)
called tp and advised that Patrick Lane is going to be out of the office on 4.3.15 and r/s to 3.26.15.Marland KitchenMarland Kitchenpt ok and requested I mail appts....done...avs and letter mailed

## 2013-10-09 ENCOUNTER — Telehealth: Payer: Self-pay | Admitting: Dietician

## 2013-10-09 NOTE — Telephone Encounter (Signed)
Brief Outpatient Oncology Nutrition Note  Patient has been identified to be at risk on malnutrition screen.  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  Patient was last called by myself 2/24.  Patient has also been seen by the inpatient RD 3/9 and was diagnosed with moderate malnutrition at that time.  Drinks Glucerna.  Patient was not available.  Provided patient with contact information for the [facility].  Antonieta Iba, RD, LDN

## 2013-10-11 ENCOUNTER — Ambulatory Visit
Admission: RE | Admit: 2013-10-11 | Discharge: 2013-10-11 | Disposition: A | Discharge status: Home / Self Care | Payer: Medicare HMO | Source: Ambulatory Visit | Attending: Radiation Oncology | Admitting: Radiation Oncology

## 2013-10-11 ENCOUNTER — Encounter: Payer: Self-pay | Admitting: Radiation Oncology

## 2013-10-11 ENCOUNTER — Ambulatory Visit: Payer: Medicare HMO | Admitting: Nutrition

## 2013-10-11 VITALS — BP 120/63 | HR 110 | Resp 20 | Wt 183.0 lb

## 2013-10-11 DIAGNOSIS — C672 Malignant neoplasm of lateral wall of bladder: Secondary | ICD-10-CM

## 2013-10-11 NOTE — Progress Notes (Signed)
Reports he is still incontinent of urine. Reports he only pass a spoonful of urine at a time. Reports occasional dysuria. Denies hematuria. Reports daily formed bowel movements. Weight stable. Reports his appetite hasn't improved much. Scheduled to see Dory Peru today following this appointment. States, "I don't hurt I just feel lousy."

## 2013-10-11 NOTE — Progress Notes (Signed)
Patient presents to nutrition consult.  He is an 80 year old male diagnosed with bladder cancer.  Past medical history includes COPD, diabetes, severe protein calorie malnutrition, hyperlipidemia, AAA, GERD, gastric ulcer, and left foot ulcer.  Medications include Lomotil, Glucophage, multivitamin, florastor.  Height: 6 feet 1 inch. Weight: 183 pounds. Usual body weight: 200 pounds. BMI: 24.15.  Patient reports a 50 pound weight loss since diagnosis.  He states his wife died in the fall.  He has a lot of friends and family that support him and do his grocery shopping and order meals for him.  He has cases of oral nutrition supplements in his refrigerator.  He reports he consumes these 3 times a day.  He does consume 3 meals a day and reports his appetite has improved slightly.  Patient reports bowels are loose but improved with medication.  Patient denies any education or nutrition needs at this time.  Nutrition diagnosis: Unintended weight loss related to bladder cancer and associated treatments as evidenced by 9% weight loss since January.  Intervention: Recommended patient continue high-calorie, high-protein meals with oral nutrition supplements 3 times a day.  Enforced the importance of adequate nutrition for improved quality-of-life and strength.  Patient declined education materials, samples, or assistance at this time.  Provided my contact information for questions or concerns.  Patient was appreciative.  Monitoring, evaluation, goals: Patient will tolerate adequate calories and protein for minimal weight loss.  Next visit: No followup is scheduled.  Patient agrees to contact me with questions or concerns.

## 2013-10-11 NOTE — Progress Notes (Signed)
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  Reports he is still incontinent of urine. Reports he only pass a spoonful of urine at a time. Reports occasional dysuria. Denies hematuria. Reports daily formed bowel movements. Weight stable. Reports his appetite hasn't improved much. Scheduled to see Dory Peru today following this appointment. States, "I don't hurt I just feel lousy"                              ALLERGIES:  is allergic to celebrex.  Meds: Current Outpatient Prescriptions  Medication Sig Dispense Refill  . Alum & Mag Hydroxide-Simeth (MAGIC MOUTHWASH W/LIDOCAINE) SOLN Take 10 mLs by mouth 4 (four) times daily as needed for mouth pain.  240 mL  5  . AVODART 0.5 MG capsule Take 0.5 mg by mouth every morning.       . budesonide-formoterol (SYMBICORT) 80-4.5 MCG/ACT inhaler Inhale 2 puffs into the lungs 2 (two) times daily.  1 Inhaler  0  . COMBIVENT RESPIMAT 20-100 MCG/ACT AERS Take 2 puffs by mouth every 6 (six) hours as needed for wheezing.       Marland Kitchen dextromethorphan-guaiFENesin (MUCINEX DM) 30-600 MG per 12 hr tablet Take 2 tablets by mouth 2 (two) times daily.  120 tablet  0  . diphenoxylate-atropine (LOMOTIL) 2.5-0.025 MG per tablet Take 1-2 tablets by mouth 4 (four) times daily as needed for diarrhea or loose stools.  60 tablet  0  . feeding supplement, GLUCERNA SHAKE, (GLUCERNA SHAKE) LIQD Take 237 mLs by mouth 3 (three) times daily between meals.  90 Can  0  . ferrous sulfate 325 (65 FE) MG tablet Take 1 tablet (325 mg total) by mouth 2 (two) times daily with a meal.  60 tablet  3  .  HYDROcodone-acetaminophen (NORCO/VICODIN) 5-325 MG per tablet Take 1-2 tablets by mouth every 4 (four) hours as needed for moderate pain.      . metFORMIN (GLUCOPHAGE) 500 MG tablet Take 1,000 mg by mouth 2 (two) times daily with a meal.      . Multiple Vitamin (MULTIVITAMIN) tablet Take 1 tablet by mouth daily.      Marland Kitchen oxybutynin (DITROPAN XL) 10 MG 24 hr tablet Take 1 tablet (10 mg total) by mouth daily.  30 tablet  0  . saccharomyces boulardii (FLORASTOR) 250 MG capsule Take 1 capsule (250 mg total) by mouth 2 (two) times daily.  60 capsule  0  . levofloxacin (LEVAQUIN) 750 MG tablet Take 1 tablet (750 mg total) by mouth daily.  3 tablet  0   No current facility-administered medications for this encounter.    Physical Findings: The patient is in no acute distress. Patient is alert and oriented.  weight is 183 lb (83.008 kg). His blood pressure is 120/63 and his pulse is 110. His respiration is 20. Marland Kitchen  No significant changes.  Lab Findings: Lab Results  Component Value Date   WBC 3.2* 09/27/2013   HGB 7.5* 09/27/2013   HCT 23.6* 09/27/2013   MCV 97.9 09/27/2013   PLT 298 09/27/2013    @  LASTCHEM@  Radiographic Findings: Dg Chest 2 View  09/22/2013   CLINICAL DATA:  Cough and congestion, history of COPD and bladder malignancy and diabetes  EXAM: CHEST  2 VIEW  COMPARISON:  Portable chest x-ray and chest CT scan of August 29, 2013.  FINDINGS: The lungs are mildly hyperinflated. There is no focal infiltrate. Coarse increased lung markings in the lower lobes posteriorly are present and likely reflect bronchiectasis as seen on the previous CT scan. The cardiopericardial silhouette is top-normal in size. The pulmonary vascularity is not engorged. There is no pleural effusion. The mediastinum is normal in width. There is mild tortuosity of the descending thoracic aorta. The observed portions of the bony thorax exhibit no acute abnormalities.  IMPRESSION: There is hyperinflation consistent with COPD.  There is no alveolar pneumonia. Lower lobe increased density is not new and is consistent with bronchiectasis. There is no evidence of CHF.   Electronically Signed   By: David  Martinique   On: 09/22/2013 20:56    Impression:  The patient is recovering from the effects of radiation.    Plan:  Follow-up with urology on 10/26/13.  _____________________________________  Sheral Apley. Tammi Klippel, M.D.

## 2013-10-19 ENCOUNTER — Ambulatory Visit (HOSPITAL_BASED_OUTPATIENT_CLINIC_OR_DEPARTMENT_OTHER): Payer: Commercial Managed Care - HMO | Admitting: Oncology

## 2013-10-19 ENCOUNTER — Encounter: Payer: Self-pay | Admitting: Oncology

## 2013-10-19 ENCOUNTER — Other Ambulatory Visit (HOSPITAL_BASED_OUTPATIENT_CLINIC_OR_DEPARTMENT_OTHER): Payer: Commercial Managed Care - HMO

## 2013-10-19 ENCOUNTER — Encounter: Payer: Medicare HMO | Admitting: Nutrition

## 2013-10-19 ENCOUNTER — Ambulatory Visit: Payer: Medicare HMO | Admitting: Oncology

## 2013-10-19 VITALS — BP 115/54 | HR 113 | Temp 97.9°F | Resp 20 | Ht 73.0 in | Wt 187.1 lb

## 2013-10-19 DIAGNOSIS — C672 Malignant neoplasm of lateral wall of bladder: Secondary | ICD-10-CM

## 2013-10-19 DIAGNOSIS — C679 Malignant neoplasm of bladder, unspecified: Secondary | ICD-10-CM

## 2013-10-19 DIAGNOSIS — R197 Diarrhea, unspecified: Secondary | ICD-10-CM

## 2013-10-19 LAB — COMPREHENSIVE METABOLIC PANEL (CC13)
ALBUMIN: 3.4 g/dL — AB (ref 3.5–5.0)
ALT: 6 U/L (ref 0–55)
ANION GAP: 12 meq/L — AB (ref 3–11)
AST: 16 U/L (ref 5–34)
Alkaline Phosphatase: 55 U/L (ref 40–150)
BILIRUBIN TOTAL: 0.49 mg/dL (ref 0.20–1.20)
BUN: 14.5 mg/dL (ref 7.0–26.0)
CO2: 24 mEq/L (ref 22–29)
Calcium: 10.2 mg/dL (ref 8.4–10.4)
Chloride: 104 mEq/L (ref 98–109)
Creatinine: 0.9 mg/dL (ref 0.7–1.3)
GLUCOSE: 136 mg/dL (ref 70–140)
Potassium: 4.2 mEq/L (ref 3.5–5.1)
Sodium: 139 mEq/L (ref 136–145)
Total Protein: 7.4 g/dL (ref 6.4–8.3)

## 2013-10-19 LAB — CBC WITH DIFFERENTIAL/PLATELET
BASO%: 0.3 % (ref 0.0–2.0)
BASOS ABS: 0 10*3/uL (ref 0.0–0.1)
EOS ABS: 0.4 10*3/uL (ref 0.0–0.5)
EOS%: 8 % — ABNORMAL HIGH (ref 0.0–7.0)
HEMATOCRIT: 32.1 % — AB (ref 38.4–49.9)
HEMOGLOBIN: 10.3 g/dL — AB (ref 13.0–17.1)
LYMPH%: 18.4 % (ref 14.0–49.0)
MCH: 31.9 pg (ref 27.2–33.4)
MCHC: 32.1 g/dL (ref 32.0–36.0)
MCV: 99.3 fL — AB (ref 79.3–98.0)
MONO#: 0.4 10*3/uL (ref 0.1–0.9)
MONO%: 7.8 % (ref 0.0–14.0)
NEUT%: 65.5 % (ref 39.0–75.0)
NEUTROS ABS: 3.1 10*3/uL (ref 1.5–6.5)
PLATELETS: 268 10*3/uL (ref 140–400)
RBC: 3.24 10*6/uL — AB (ref 4.20–5.82)
RDW: 19.2 % — ABNORMAL HIGH (ref 11.0–14.6)
WBC: 4.8 10*3/uL (ref 4.0–10.3)
lymph#: 0.9 10*3/uL (ref 0.9–3.3)

## 2013-10-19 NOTE — Progress Notes (Signed)
Hematology and Oncology Follow Up Visit  Patrick Lane [id number] 1928/03/16 80 y.o. 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   Prior Therapy:  1. He is status post a repeat TURBT on 06/25/2013 under the care of Dr. Risa Grill.  2. He received combined modality therapy radiation and chemotherapy started on 07/27/2013. He received carboplatin AUC of 2 on a weekly basis with radiation. His last chemo was given on 08/24/13 (stopped early due to hospitalization for COPD exacerbation). He completed XRT on 09/14/13.  Current therapy: Watchful observation.  Interim History:  Patrick Lane presents today for a followup visit. He is a pleasant gentleman with the above diagnosis presents today for routine follow-up. Still weak overall since completing therapy, but remains independent with ADLs. Denies fevers, cough, SOB.  He has not reported any nausea, vomiting or any GI complications. He does report some loose bowel movements, but better since completing therapy. Using Imodium or Lomotil which is helping. Appetite improving and he is gaining back lost weight. Drinks 3 cans of Ensure or Boost daily.   Medications: I have reviewed the patient's current medications.  Current Outpatient Prescriptions  Medication Sig Dispense Refill  . Alum & Mag Hydroxide-Simeth (MAGIC MOUTHWASH W/LIDOCAINE) SOLN Take 10 mLs by mouth 4 (four) times daily as needed for mouth pain.  240 mL  5  . AVODART 0.5 MG capsule Take 0.5 mg by mouth every morning.       . budesonide-formoterol (SYMBICORT) 80-4.5 MCG/ACT inhaler Inhale 2 puffs into the lungs 2 (two) times daily.  1 Inhaler  0  . COMBIVENT RESPIMAT 20-100 MCG/ACT AERS Take 2 puffs by mouth every 6 (six) hours as needed for wheezing.       Marland Kitchen dextromethorphan-guaiFENesin (MUCINEX DM) 30-600 MG per 12  hr tablet Take 2 tablets by mouth 2 (two) times daily.  120 tablet  0  . diphenoxylate-atropine (LOMOTIL) 2.5-0.025 MG per tablet Take 1-2 tablets by mouth 4 (four) times daily as needed for diarrhea or loose stools.  60 tablet  0  . feeding supplement, GLUCERNA SHAKE, (GLUCERNA SHAKE) LIQD Take 237 mLs by mouth 3 (three) times daily between meals.  90 Can  0  . ferrous sulfate 325 (65 FE) MG tablet Take 1 tablet (325 mg total) by mouth 2 (two) times daily with a meal.  60 tablet  3  . HYDROcodone-acetaminophen (NORCO/VICODIN) 5-325 MG per tablet Take 1-2 tablets by mouth every 4 (four) hours as needed for moderate pain.      . metFORMIN (GLUCOPHAGE) 500 MG tablet Take 1,000 mg by mouth 2 (two) times daily with a meal.      . Multiple Vitamin (MULTIVITAMIN) tablet Take 1 tablet by mouth daily.      Marland Kitchen oxybutynin (DITROPAN XL) 10 MG 24 hr tablet Take 1 tablet (10 mg total) by mouth daily.  30 tablet  0  . saccharomyces boulardii (FLORASTOR) 250 MG capsule Take 1 capsule (250 mg total) by mouth 2 (two) times daily.  60 capsule  0   No current facility-administered medications for this visit.     Allergies:  Allergies  Allergen Reactions  . Celebrex [Celecoxib] Hives    Past Medical History, Surgical history, Social history, and Family History were reviewed and updated.  Review of Systems: Constitutional:  Negative for fever, chills, night sweats, anorexia, weight loss,  pain.  Remaining ROS negative. Physical Exam: Blood pressure 115/54, pulse 113, temperature 97.9 F (36.6 C), temperature source Oral, resp. rate 20, height 6\' 1"  (1.854 m), weight 187 lb 1.6 oz (84.868 kg). ECOG: 1 General appearance: alert, cooperative and appears stated age Head: Normocephalic, without obvious abnormality, atraumatic Neck: no adenopathy, no carotid bruit, no JVD, supple, symmetrical, trachea midline and thyroid not enlarged, symmetric, no tenderness/mass/nodules Lymph nodes: Cervical, supraclavicular,  and axillary nodes normal. Heart:regular rate and rhythm, S1, S2 normal, no murmur, click, rub or gallop Lung:chest clear, no wheezing, rales, normal symmetric air entry Abdomen: soft, non-tender, without masses or organomegaly EXT:no erythema, induration, or nodules   Lab Results: Lab Results  Component Value Date   WBC 4.8 10/19/2013   HGB 10.3* 10/19/2013   HCT 32.1* 10/19/2013   MCV 99.3* 10/19/2013   PLT 268 10/19/2013     Chemistry      Component Value Date/Time   NA 139 10/19/2013 1238   NA 138 09/26/2013 0400   K 4.2 10/19/2013 1238   K 4.4 09/26/2013 0400   CL 99 09/26/2013 0400   CO2 24 10/19/2013 1238   CO2 28 09/26/2013 0400   BUN 14.5 10/19/2013 1238   BUN 12 09/26/2013 0400   CREATININE 0.9 10/19/2013 1238   CREATININE 0.80 09/26/2013 0400      Component Value Date/Time   CALCIUM 10.2 10/19/2013 1238   CALCIUM 9.2 09/26/2013 0400   ALKPHOS 55 10/19/2013 1238   ALKPHOS 57 09/23/2013 0505   AST 16 10/19/2013 1238   AST 14 09/23/2013 0505   ALT 6 10/19/2013 1238   ALT 8 09/23/2013 0505   BILITOT 0.49 10/19/2013 1238   BILITOT 0.4 09/23/2013 0505      Impression and Plan:  This is an 80 year old gentleman with the following issues:  1. 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 He is not a candidate for cystectomy and will receive combined modality with radiation therapy and chemotherapy. S/P weekly carboplatin and with radiation therapy. Plan for restaging CT scans in about 3 months.  2. Nausea prophylaxis: He has Compazine at home. 3. Diarrhea:Continue Imodium and Lomotil.  4. Followup. In 3 months   Patrick Lane 4/3/20152:18 PM

## 2013-10-23 ENCOUNTER — Telehealth: Payer: Self-pay | Admitting: Oncology

## 2013-10-23 NOTE — Telephone Encounter (Signed)
S/w the pt and he wants his appts to be mailed to him. Mailed the pt his July 2015 appt calendar along with the ct scan and lab appts.

## 2014-01-17 ENCOUNTER — Other Ambulatory Visit: Payer: Medicare HMO

## 2014-01-17 ENCOUNTER — Ambulatory Visit (HOSPITAL_COMMUNITY): Payer: Medicare HMO

## 2014-01-18 ENCOUNTER — Other Ambulatory Visit: Payer: Medicare HMO

## 2014-01-21 ENCOUNTER — Ambulatory Visit (HOSPITAL_COMMUNITY)
Admission: RE | Admit: 2014-01-21 | Discharge: 2014-01-21 | Disposition: A | Discharge status: Home / Self Care | Payer: Medicare HMO | Source: Ambulatory Visit | Attending: Oncology | Admitting: Oncology

## 2014-01-21 ENCOUNTER — Encounter (HOSPITAL_COMMUNITY): Payer: Self-pay

## 2014-01-21 ENCOUNTER — Other Ambulatory Visit (HOSPITAL_BASED_OUTPATIENT_CLINIC_OR_DEPARTMENT_OTHER): Payer: Commercial Managed Care - HMO

## 2014-01-21 DIAGNOSIS — I714 Abdominal aortic aneurysm, without rupture, unspecified: Secondary | ICD-10-CM | POA: Insufficient documentation

## 2014-01-21 DIAGNOSIS — K769 Liver disease, unspecified: Secondary | ICD-10-CM | POA: Insufficient documentation

## 2014-01-21 DIAGNOSIS — N289 Disorder of kidney and ureter, unspecified: Secondary | ICD-10-CM | POA: Insufficient documentation

## 2014-01-21 DIAGNOSIS — C672 Malignant neoplasm of lateral wall of bladder: Secondary | ICD-10-CM

## 2014-01-21 DIAGNOSIS — C679 Malignant neoplasm of bladder, unspecified: Secondary | ICD-10-CM

## 2014-01-21 DIAGNOSIS — J438 Other emphysema: Secondary | ICD-10-CM | POA: Insufficient documentation

## 2014-01-21 DIAGNOSIS — K5730 Diverticulosis of large intestine without perforation or abscess without bleeding: Secondary | ICD-10-CM | POA: Insufficient documentation

## 2014-01-21 DIAGNOSIS — I700 Atherosclerosis of aorta: Secondary | ICD-10-CM | POA: Insufficient documentation

## 2014-01-21 DIAGNOSIS — I2510 Atherosclerotic heart disease of native coronary artery without angina pectoris: Secondary | ICD-10-CM | POA: Insufficient documentation

## 2014-01-21 DIAGNOSIS — J479 Bronchiectasis, uncomplicated: Secondary | ICD-10-CM | POA: Insufficient documentation

## 2014-01-21 LAB — COMPREHENSIVE METABOLIC PANEL (CC13)
ALBUMIN: 3.6 g/dL (ref 3.5–5.0)
ALT: 9 U/L (ref 0–55)
AST: 14 U/L (ref 5–34)
Alkaline Phosphatase: 56 U/L (ref 40–150)
Anion Gap: 10 mEq/L (ref 3–11)
BUN: 16 mg/dL (ref 7.0–26.0)
CHLORIDE: 102 meq/L (ref 98–109)
CO2: 28 mEq/L (ref 22–29)
Calcium: 10.2 mg/dL (ref 8.4–10.4)
Creatinine: 1 mg/dL (ref 0.7–1.3)
Glucose: 96 mg/dl (ref 70–140)
POTASSIUM: 4.5 meq/L (ref 3.5–5.1)
Sodium: 140 mEq/L (ref 136–145)
TOTAL PROTEIN: 7.4 g/dL (ref 6.4–8.3)
Total Bilirubin: 0.41 mg/dL (ref 0.20–1.20)

## 2014-01-21 LAB — CBC WITH DIFFERENTIAL/PLATELET
BASO%: 0.3 % (ref 0.0–2.0)
Basophils Absolute: 0 10*3/uL (ref 0.0–0.1)
EOS ABS: 0.6 10*3/uL — AB (ref 0.0–0.5)
EOS%: 7.3 % — ABNORMAL HIGH (ref 0.0–7.0)
HCT: 36.5 % — ABNORMAL LOW (ref 38.4–49.9)
HGB: 11.8 g/dL — ABNORMAL LOW (ref 13.0–17.1)
LYMPH#: 1.1 10*3/uL (ref 0.9–3.3)
LYMPH%: 13.9 % — ABNORMAL LOW (ref 14.0–49.0)
MCH: 30.5 pg (ref 27.2–33.4)
MCHC: 32.3 g/dL (ref 32.0–36.0)
MCV: 94.3 fL (ref 79.3–98.0)
MONO#: 0.5 10*3/uL (ref 0.1–0.9)
MONO%: 6.8 % (ref 0.0–14.0)
NEUT#: 5.7 10*3/uL (ref 1.5–6.5)
NEUT%: 71.7 % (ref 39.0–75.0)
Platelets: 238 10*3/uL (ref 140–400)
RBC: 3.87 10*6/uL — ABNORMAL LOW (ref 4.20–5.82)
RDW: 15.8 % — ABNORMAL HIGH (ref 11.0–14.6)
WBC: 7.9 10*3/uL (ref 4.0–10.3)

## 2014-01-21 MED ORDER — IOHEXOL 300 MG/ML  SOLN
100.0000 mL | Freq: Once | INTRAMUSCULAR | Status: AC | PRN
  Administered 2014-01-21: 100 mL via INTRAVENOUS

## 2014-01-22 ENCOUNTER — Ambulatory Visit (HOSPITAL_BASED_OUTPATIENT_CLINIC_OR_DEPARTMENT_OTHER): Payer: Commercial Managed Care - HMO | Admitting: Oncology

## 2014-01-22 ENCOUNTER — Telehealth: Payer: Self-pay | Admitting: Oncology

## 2014-01-22 ENCOUNTER — Encounter: Payer: Self-pay | Admitting: Oncology

## 2014-01-22 VITALS — BP 142/62 | HR 105 | Temp 97.8°F | Resp 17 | Ht 73.0 in | Wt 186.0 lb

## 2014-01-22 DIAGNOSIS — R197 Diarrhea, unspecified: Secondary | ICD-10-CM

## 2014-01-22 DIAGNOSIS — C679 Malignant neoplasm of bladder, unspecified: Secondary | ICD-10-CM

## 2014-01-22 DIAGNOSIS — C672 Malignant neoplasm of lateral wall of bladder: Secondary | ICD-10-CM

## 2014-01-22 NOTE — Telephone Encounter (Signed)
gv pt appt schedule for nov. [facility] will call w/ct - pt aware.

## 2014-01-22 NOTE — Progress Notes (Signed)
Hematology and Oncology Follow Up Visit  Patrick Lane [id number] 1928-01-07 80 y.o. 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   Prior Therapy:  1. He is status post a repeat TURBT on 06/25/2013 under the care of Dr. Risa Grill.  2. He received combined modality therapy radiation and chemotherapy started on 07/27/2013. He received carboplatin AUC of 2 on a weekly basis with radiation. His last chemo was given on 08/24/13 (stopped early due to hospitalization for COPD exacerbation). He completed XRT on 09/14/13.  Current therapy: Watchful observation.  Interim History:  Patrick Lane presents today for a followup visit. Since his last visit, he reports no new symptoms. He is still weak overall since completing therapy but he is getting stronger every day. He  remains independent with ADLs still drives. He still unsteady with his mobility but have not had any falls. He is still  Denies fevers, cough, SOB.  He has not reported any nausea, vomiting or any GI complications. He does report some loose bowel movements, but better since completing therapy. Appetite improving and he is gaining back lost weight. It is not or blurry vision or double vision. Has not reported any syncope. Does not report a chest pain or shortness of breath. Standpoint cough or hemoptysis. Does not report any frequency or urgency. He does report occasional incontinence but no hematuria. Has not reported skeletal complaints. Rest of review of systems unremarkable.  Medications: I have reviewed the patient's current medications.  Current Outpatient Prescriptions  Medication Sig Dispense Refill  . Alum & Mag Hydroxide-Simeth (MAGIC MOUTHWASH W/LIDOCAINE) SOLN Take 10 mLs by mouth 4 (four) times daily as needed for mouth pain.  240 mL  5  . AVODART 0.5 MG  capsule Take 0.5 mg by mouth every morning.       . budesonide-formoterol (SYMBICORT) 80-4.5 MCG/ACT inhaler Inhale 2 puffs into the lungs 2 (two) times daily.  1 Inhaler  0  . COMBIVENT RESPIMAT 20-100 MCG/ACT AERS Take 2 puffs by mouth every 6 (six) hours as needed for wheezing.       Marland Kitchen dextromethorphan-guaiFENesin (MUCINEX DM) 30-600 MG per 12 hr tablet Take 2 tablets by mouth 2 (two) times daily.  120 tablet  0  . diphenoxylate-atropine (LOMOTIL) 2.5-0.025 MG per tablet Take 1-2 tablets by mouth 4 (four) times daily as needed for diarrhea or loose stools.  60 tablet  0  . feeding supplement, GLUCERNA SHAKE, (GLUCERNA SHAKE) LIQD Take 237 mLs by mouth 3 (three) times daily between meals.  90 Can  0  . ferrous sulfate 325 (65 FE) MG tablet Take 1 tablet (325 mg total) by mouth 2 (two) times daily with a meal.  60 tablet  3  . HYDROcodone-acetaminophen (NORCO/VICODIN) 5-325 MG per tablet Take 1-2 tablets by mouth every 4 (four) hours as needed for moderate pain.      . metFORMIN (GLUCOPHAGE) 500 MG tablet Take 1,000 mg by mouth 2 (two) times daily with a meal.      . Multiple Vitamin (MULTIVITAMIN) tablet Take 1 tablet by mouth daily.      Marland Kitchen oxybutynin (DITROPAN XL) 10 MG 24 hr tablet Take 1 tablet (10 mg total) by mouth daily.  30 tablet  0  . saccharomyces boulardii (FLORASTOR) 250 MG capsule Take 1 capsule (250 mg total) by mouth 2 (two) times daily.  60 capsule  0   No current facility-administered medications for this visit.     Allergies:  Allergies  Allergen Reactions  . Celebrex [Celecoxib] Hives    Past Medical History, Surgical history, Social history, and Family History were reviewed and updated.   Physical Exam: Blood pressure 142/62, pulse 105, temperature 97.8 F (36.6 C), temperature source Oral, resp. rate 17, height 6\' 1"  (1.854 m), weight 186 lb (84.369 kg), SpO2 96.00%. ECOG: 1 General appearance: alert and cooperative Head: Normocephalic, without obvious  abnormality, atraumatic Neck: no adenopathy Lymph nodes: Cervical, supraclavicular, and axillary nodes normal. Heart:regular rate and rhythm, S1, S2 normal, no murmur, click, rub or gallop Lung:chest clear, no wheezing, rales, normal symmetric air entry Abdomen: soft, non-tender, without masses or organomegaly EXT:no erythema, induration, or nodules    Lab Results: Lab Results  Component Value Date   WBC 7.9 01/21/2014   HGB 11.8* 01/21/2014   HCT 36.5* 01/21/2014   MCV 94.3 01/21/2014   PLT 238 01/21/2014     Chemistry      Component Value Date/Time   NA 140 01/21/2014 1127   NA 138 09/26/2013 0400   K 4.5 01/21/2014 1127   K 4.4 09/26/2013 0400   CL 99 09/26/2013 0400   CO2 28 01/21/2014 1127   CO2 28 09/26/2013 0400   BUN 16.0 01/21/2014 1127   BUN 12 09/26/2013 0400   CREATININE 1.0 01/21/2014 1127   CREATININE 0.80 09/26/2013 0400      Component Value Date/Time   CALCIUM 10.2 01/21/2014 1127   CALCIUM 9.2 09/26/2013 0400   ALKPHOS 56 01/21/2014 1127   ALKPHOS 57 09/23/2013 0505   AST 14 01/21/2014 1127   AST 14 09/23/2013 0505   ALT 9 01/21/2014 1127   ALT 8 09/23/2013 0505   BILITOT 0.41 01/21/2014 1127   BILITOT 0.4 09/23/2013 0505      EXAM:  CT ABDOMEN AND PELVIS WITH CONTRAST  TECHNIQUE:  Multidetector CT imaging of the abdomen and pelvis was performed  using the standard protocol following bolus administration of  intravenous contrast. Oral contrast was also administered.  CONTRAST: 121mL OMNIPAQUE IOHEXOL 300 MG/ML SOLN  COMPARISON: May 30, 2013  FINDINGS:  There is underlying emphysematous change. There is interstitial  fibrosis in the posterior lung bases bilaterally as well as mild  lower lobe bronchiectatic change bilaterally. There is a mild degree  of consolidation in the posterior left base. There are multiple foci  of coronary artery calcification.  Liver is prominent, measuring 18.0 cm in length. There is occasional  small calcified granulomas in the liver. No other liver  lesions are  identified. There are no liver mass suggesting metastatic disease in  particular. Gallbladder wall is not thickened. There is no  appreciable biliary duct dilatation.  Spleen, pancreas, and adrenals appear within normal limits. Kidneys  bilaterally show no appreciable mass or hydronephrosis. There is  mild scarring in the lower pole the right kidney. There is no renal  or ureteral calculus on either side.  In the pelvis, there is focal irregular thickening along the  inferior posterior wall of the bladder on the left. There is a focal  area of enhancement in this area, best seen on axial images,  measuring 3.1 x 2.0 cm. There is urinary bladder wall thickening  bilaterally, more on the left than on the right. There is no  evidence of mass extending outside of the urinary bladder. There is  no mass outside of the bladder in  the pelvis. There is no  appreciable fluid collection in the pelvis. There are sigmoid  diverticula but no diverticulitis. Appendix is absent.  There is atherosclerotic change in the aorta. There is a distal  abdominal aortic aneurysm with maximum transverse diameter of 4.6 x  4.2 cm, slightly larger than on prior study. There is no periaortic  fluid. Atherosclerotic change is seen in both iliac arteries without  aneurysm in these areas. The current aneurysm arises inferior to the  renal arteries and terminates at the bifurcation.  There are new left-sided periaortic lymph nodes at the level of the  lower pole of the left kidney. The largest of these lymph nodes  measures 1.5 x 1.1 cm in size. An adjacent lymph node measures  1.5x1.1 cm. There is a lymph node between the aorta and inferior  vena cava measuring 1.2 x 1.2 cm, stable. There is a stable lymph  node adjacent to the caudate lobe of the liver in the periportal  region measuring 2.5 x 1.4 cm which is slightly increased compared  to the previous study. A second lymph node in the periportal region   measures 1.3 x 1.2 cm, stable. No other lymph node prominence is  appreciable. Subcentimeter inguinal lymph nodes bilaterally are  stable.  There is no abscess or ascites in the abdomen or pelvis. There is no  bowel obstruction. No free air or portal venous air. Bones are  somewhat osteoporotic. There is degenerative change in the lumbar  spine. There are no blastic or lytic bone lesions.  IMPRESSION:  Evidence of recurrent or residual mass along the left side of the  urinary bladder, posteriorly and inferiorly. Irregular bladder wall  thickening on the left may be at least in part due to postoperative/  post chemotherapy change.  New retroperitoneal lymph nodes on the left. Increase in the size of  a periportal lymph node. Suspect adenopathy, likely indicative of  bladder carcinoma spread.  No focal liver lesions are identified beyond occasional calcified  granulomas.  Infrarenal abdominal aortic aneurysm, slightly increased in size  compared to prior study.  Sigmoid diverticulosis without diverticulitis. Appendix absent. No  bowel obstruction.  There is underlying emphysematous change with bibasilar interstitial  fibrosis. Suspect small area of superimposed pneumonia posterior  left base.      Impression and Plan:  This is an 80 year old gentleman with the following issues:  1. Muscle invasive transitional cell carcinoma of the bladder. He status post chemotherapy and radiation instead as he is not a candidate for cystectomy. CT scan from 01/21/2014 was discussed and showed no widespread of disease. He does have some pelvic adenopathy that we'll continue to monitor. He is a frail gentleman and not really a candidate for aggressive chemotherapy. We will consider palliative chemotherapy if this lymphadenopathy becomes widespread and he become symptomatic. I will repeat imaging studies in November 2015.  2. Nausea prophylaxis:  This have resolved at this time. 3. Diarrhea:Continue  Imodium and Lomotil as needed.  4. Followup. In 4 months   Tyron Manetta 7/7/20152:51 PM

## 2014-03-01 ENCOUNTER — Other Ambulatory Visit: Payer: Self-pay | Admitting: Family Medicine

## 2014-04-15 ENCOUNTER — Encounter (HOSPITAL_COMMUNITY): Payer: Self-pay | Admitting: Emergency Medicine

## 2014-04-15 ENCOUNTER — Emergency Department (HOSPITAL_COMMUNITY): Payer: Medicare HMO

## 2014-04-15 ENCOUNTER — Inpatient Hospital Stay (HOSPITAL_COMMUNITY): Payer: Medicare HMO

## 2014-04-15 ENCOUNTER — Inpatient Hospital Stay (HOSPITAL_COMMUNITY)
Admission: EM | Admit: 2014-04-15 | Discharge: 2014-05-19 | DRG: 082 | Disposition: E | Payer: Medicare HMO | Attending: Internal Medicine | Admitting: Internal Medicine

## 2014-04-15 DIAGNOSIS — D638 Anemia in other chronic diseases classified elsewhere: Secondary | ICD-10-CM | POA: Diagnosis present

## 2014-04-15 DIAGNOSIS — W1839XA Other fall on same level, initial encounter: Secondary | ICD-10-CM | POA: Diagnosis present

## 2014-04-15 DIAGNOSIS — Z515 Encounter for palliative care: Secondary | ICD-10-CM | POA: Diagnosis not present

## 2014-04-15 DIAGNOSIS — E785 Hyperlipidemia, unspecified: Secondary | ICD-10-CM | POA: Diagnosis present

## 2014-04-15 DIAGNOSIS — N138 Other obstructive and reflux uropathy: Secondary | ICD-10-CM | POA: Diagnosis present

## 2014-04-15 DIAGNOSIS — I609 Nontraumatic subarachnoid hemorrhage, unspecified: Secondary | ICD-10-CM | POA: Diagnosis present

## 2014-04-15 DIAGNOSIS — S065X9A Traumatic subdural hemorrhage with loss of consciousness of unspecified duration, initial encounter: Secondary | ICD-10-CM | POA: Diagnosis present

## 2014-04-15 DIAGNOSIS — E279 Disorder of adrenal gland, unspecified: Secondary | ICD-10-CM | POA: Diagnosis present

## 2014-04-15 DIAGNOSIS — N329 Bladder disorder, unspecified: Secondary | ICD-10-CM | POA: Diagnosis present

## 2014-04-15 DIAGNOSIS — J449 Chronic obstructive pulmonary disease, unspecified: Secondary | ICD-10-CM | POA: Diagnosis present

## 2014-04-15 DIAGNOSIS — N179 Acute kidney failure, unspecified: Secondary | ICD-10-CM | POA: Diagnosis present

## 2014-04-15 DIAGNOSIS — E119 Type 2 diabetes mellitus without complications: Secondary | ICD-10-CM | POA: Diagnosis present

## 2014-04-15 DIAGNOSIS — E876 Hypokalemia: Secondary | ICD-10-CM | POA: Diagnosis not present

## 2014-04-15 DIAGNOSIS — I480 Paroxysmal atrial fibrillation: Secondary | ICD-10-CM | POA: Diagnosis present

## 2014-04-15 DIAGNOSIS — I4892 Unspecified atrial flutter: Secondary | ICD-10-CM | POA: Diagnosis not present

## 2014-04-15 DIAGNOSIS — Z923 Personal history of irradiation: Secondary | ICD-10-CM

## 2014-04-15 DIAGNOSIS — Z87891 Personal history of nicotine dependence: Secondary | ICD-10-CM

## 2014-04-15 DIAGNOSIS — E43 Unspecified severe protein-calorie malnutrition: Secondary | ICD-10-CM

## 2014-04-15 DIAGNOSIS — Z66 Do not resuscitate: Secondary | ICD-10-CM | POA: Diagnosis present

## 2014-04-15 DIAGNOSIS — Z9842 Cataract extraction status, left eye: Secondary | ICD-10-CM | POA: Diagnosis not present

## 2014-04-15 DIAGNOSIS — N1330 Unspecified hydronephrosis: Secondary | ICD-10-CM | POA: Diagnosis present

## 2014-04-15 DIAGNOSIS — Z961 Presence of intraocular lens: Secondary | ICD-10-CM | POA: Diagnosis present

## 2014-04-15 DIAGNOSIS — G4089 Other seizures: Secondary | ICD-10-CM | POA: Diagnosis not present

## 2014-04-15 DIAGNOSIS — C672 Malignant neoplasm of lateral wall of bladder: Secondary | ICD-10-CM

## 2014-04-15 DIAGNOSIS — M1990 Unspecified osteoarthritis, unspecified site: Secondary | ICD-10-CM | POA: Diagnosis present

## 2014-04-15 DIAGNOSIS — E46 Unspecified protein-calorie malnutrition: Secondary | ICD-10-CM | POA: Diagnosis present

## 2014-04-15 DIAGNOSIS — Z8711 Personal history of peptic ulcer disease: Secondary | ICD-10-CM | POA: Diagnosis not present

## 2014-04-15 DIAGNOSIS — R55 Syncope and collapse: Secondary | ICD-10-CM

## 2014-04-15 DIAGNOSIS — C679 Malignant neoplasm of bladder, unspecified: Secondary | ICD-10-CM | POA: Diagnosis present

## 2014-04-15 DIAGNOSIS — R1310 Dysphagia, unspecified: Secondary | ICD-10-CM | POA: Diagnosis present

## 2014-04-15 DIAGNOSIS — Z9221 Personal history of antineoplastic chemotherapy: Secondary | ICD-10-CM | POA: Diagnosis not present

## 2014-04-15 DIAGNOSIS — Z79899 Other long term (current) drug therapy: Secondary | ICD-10-CM

## 2014-04-15 DIAGNOSIS — Z9841 Cataract extraction status, right eye: Secondary | ICD-10-CM | POA: Diagnosis not present

## 2014-04-15 DIAGNOSIS — J9601 Acute respiratory failure with hypoxia: Secondary | ICD-10-CM

## 2014-04-15 DIAGNOSIS — R6510 Systemic inflammatory response syndrome (SIRS) of non-infectious origin without acute organ dysfunction: Secondary | ICD-10-CM | POA: Diagnosis present

## 2014-04-15 DIAGNOSIS — I10 Essential (primary) hypertension: Secondary | ICD-10-CM | POA: Diagnosis present

## 2014-04-15 DIAGNOSIS — S066X9A Traumatic subarachnoid hemorrhage with loss of consciousness of unspecified duration, initial encounter: Principal | ICD-10-CM | POA: Diagnosis present

## 2014-04-15 DIAGNOSIS — Z7189 Other specified counseling: Secondary | ICD-10-CM

## 2014-04-15 DIAGNOSIS — I714 Abdominal aortic aneurysm, without rupture: Secondary | ICD-10-CM | POA: Diagnosis present

## 2014-04-15 DIAGNOSIS — Z6821 Body mass index (BMI) 21.0-21.9, adult: Secondary | ICD-10-CM | POA: Diagnosis not present

## 2014-04-15 DIAGNOSIS — G9340 Encephalopathy, unspecified: Secondary | ICD-10-CM | POA: Diagnosis present

## 2014-04-15 DIAGNOSIS — D72829 Elevated white blood cell count, unspecified: Secondary | ICD-10-CM | POA: Diagnosis present

## 2014-04-15 DIAGNOSIS — K219 Gastro-esophageal reflux disease without esophagitis: Secondary | ICD-10-CM | POA: Diagnosis present

## 2014-04-15 DIAGNOSIS — R197 Diarrhea, unspecified: Secondary | ICD-10-CM | POA: Diagnosis not present

## 2014-04-15 DIAGNOSIS — B965 Pseudomonas (aeruginosa) (mallei) (pseudomallei) as the cause of diseases classified elsewhere: Secondary | ICD-10-CM | POA: Diagnosis present

## 2014-04-15 DIAGNOSIS — G8191 Hemiplegia, unspecified affecting right dominant side: Secondary | ICD-10-CM | POA: Diagnosis present

## 2014-04-15 DIAGNOSIS — N289 Disorder of kidney and ureter, unspecified: Secondary | ICD-10-CM

## 2014-04-15 DIAGNOSIS — J471 Bronchiectasis with (acute) exacerbation: Secondary | ICD-10-CM

## 2014-04-15 LAB — BLOOD GAS, ARTERIAL
Acid-Base Excess: 0.7 mmol/L (ref 0.0–2.0)
Bicarbonate: 25.2 mEq/L — ABNORMAL HIGH (ref 20.0–24.0)
Drawn by: 347621
FIO2: 100 %
MECHVT: 640 mL
O2 Saturation: 99.8 %
PCO2 ART: 43.6 mmHg (ref 35.0–45.0)
PEEP: 5 cmH2O
Patient temperature: 98.6
RATE: 16 resp/min
TCO2: 26.5 mmol/L (ref 0–100)
pH, Arterial: 7.38 (ref 7.350–7.450)
pO2, Arterial: 382 mmHg — ABNORMAL HIGH (ref 80.0–100.0)

## 2014-04-15 LAB — CBC
HCT: 31.9 % — ABNORMAL LOW (ref 39.0–52.0)
Hemoglobin: 10.4 g/dL — ABNORMAL LOW (ref 13.0–17.0)
MCH: 29.8 pg (ref 26.0–34.0)
MCHC: 32.6 g/dL (ref 30.0–36.0)
MCV: 91.4 fL (ref 78.0–100.0)
PLATELETS: 365 10*3/uL (ref 150–400)
RBC: 3.49 MIL/uL — ABNORMAL LOW (ref 4.22–5.81)
RDW: 15.4 % (ref 11.5–15.5)
WBC: 13.8 10*3/uL — AB (ref 4.0–10.5)

## 2014-04-15 LAB — BASIC METABOLIC PANEL
ANION GAP: 16 — AB (ref 5–15)
BUN: 24 mg/dL — ABNORMAL HIGH (ref 6–23)
CO2: 25 mEq/L (ref 19–32)
Calcium: 9.6 mg/dL (ref 8.4–10.5)
Chloride: 95 mEq/L — ABNORMAL LOW (ref 96–112)
Creatinine, Ser: 1.66 mg/dL — ABNORMAL HIGH (ref 0.50–1.35)
GFR calc non Af Amer: 36 mL/min — ABNORMAL LOW (ref >90)
GFR, EST AFRICAN AMERICAN: 42 mL/min — AB (ref >90)
Glucose, Bld: 199 mg/dL — ABNORMAL HIGH (ref 70–99)
POTASSIUM: 4.4 meq/L (ref 3.7–5.3)
Sodium: 136 mEq/L — ABNORMAL LOW (ref 137–147)

## 2014-04-15 LAB — PROTIME-INR
INR: 1.19 (ref 0.00–1.49)
Prothrombin Time: 15.1 seconds (ref 11.6–15.2)

## 2014-04-15 LAB — TROPONIN I: Troponin I: <0.3 ng/mL (ref <0.30)

## 2014-04-15 LAB — MRSA PCR SCREENING: MRSA BY PCR: NEGATIVE

## 2014-04-15 LAB — CK: Total CK: 146 U/L (ref 7–232)

## 2014-04-15 LAB — GLUCOSE, CAPILLARY: Glucose-Capillary: 186 mg/dL — ABNORMAL HIGH (ref 70–99)

## 2014-04-15 MED ORDER — SODIUM CHLORIDE 0.9 % IV BOLUS (SEPSIS)
1000.0000 mL | Freq: Once | INTRAVENOUS | Status: AC
Start: 2014-04-15 — End: 2014-04-15
  Administered 2014-04-15: 1000 mL via INTRAVENOUS

## 2014-04-15 MED ORDER — PIPERACILLIN-TAZOBACTAM 3.375 G IVPB
3.3750 g | Freq: Three times a day (TID) | INTRAVENOUS | Status: DC
  Administered 2014-04-15 – 2014-04-20 (×15): 3.375 g via INTRAVENOUS
  Filled 2014-04-15 (×16): qty 50

## 2014-04-15 MED ORDER — DILTIAZEM LOAD VIA INFUSION
15.0000 mg | Freq: Once | INTRAVENOUS | Status: DC
  Filled 2014-04-15: qty 15

## 2014-04-15 MED ORDER — SODIUM CHLORIDE 0.9 % IV SOLN: INTRAVENOUS | Status: DC

## 2014-04-15 MED ORDER — PROPOFOL 10 MG/ML IV EMUL
0.0000 ug/kg/min | INTRAVENOUS | Status: DC
  Administered 2014-04-15: 40 ug/kg/min via INTRAVENOUS
  Administered 2014-04-15: 30 ug/kg/min via INTRAVENOUS
  Administered 2014-04-16: 20 ug/kg/min via INTRAVENOUS
  Administered 2014-04-16: 35 ug/kg/min via INTRAVENOUS
  Filled 2014-04-15 (×5): qty 100

## 2014-04-15 MED ORDER — DILTIAZEM HCL 100 MG IV SOLR
5.0000 mg/h | INTRAVENOUS | Status: DC
  Administered 2014-04-15: 5 mg/h via INTRAVENOUS

## 2014-04-15 MED ORDER — PROPOFOL 10 MG/ML IV EMUL
INTRAVENOUS | Status: AC
  Filled 2014-04-15: qty 100

## 2014-04-15 MED ORDER — SODIUM CHLORIDE 0.9 % IV SOLN
INTRAVENOUS | Status: DC
  Administered 2014-04-15: 19:00:00 via INTRAVENOUS
  Administered 2014-04-16: 100 mL/h via INTRAVENOUS

## 2014-04-15 MED ORDER — FENTANYL CITRATE 0.05 MG/ML IJ SOLN
INTRAMUSCULAR | Status: AC
  Administered 2014-04-15: 100 ug
  Filled 2014-04-15: qty 2

## 2014-04-15 MED ORDER — DILTIAZEM LOAD VIA INFUSION
10.0000 mg | Freq: Once | INTRAVENOUS | Status: AC
  Administered 2014-04-15: 10 mg via INTRAVENOUS
  Filled 2014-04-15: qty 10

## 2014-04-15 MED ORDER — PANTOPRAZOLE SODIUM 40 MG IV SOLR
40.0000 mg | Freq: Every day | INTRAVENOUS | Status: DC
  Administered 2014-04-15 – 2014-04-17 (×3): 40 mg via INTRAVENOUS
  Filled 2014-04-15 (×4): qty 40

## 2014-04-15 MED ORDER — HYDROMORPHONE HCL 1 MG/ML IJ SOLN
1.0000 mg | Freq: Once | INTRAMUSCULAR | Status: AC
  Administered 2014-04-15: 1 mg via INTRAVENOUS
  Filled 2014-04-15: qty 1

## 2014-04-15 MED ORDER — SODIUM CHLORIDE 0.9 % IV SOLN
250.0000 mL | INTRAVENOUS | Status: DC | PRN
Start: 2014-04-15 — End: 2014-04-17

## 2014-04-15 MED ORDER — IOHEXOL 300 MG/ML  SOLN
80.0000 mL | Freq: Once | INTRAMUSCULAR | Status: AC | PRN
  Administered 2014-04-15: 80 mL via INTRAVENOUS

## 2014-04-15 MED ORDER — FENTANYL CITRATE 0.05 MG/ML IJ SOLN
100.0000 ug | Freq: Once | INTRAMUSCULAR | Status: AC
  Administered 2014-04-15: 100 ug via INTRAVENOUS
  Filled 2014-04-15: qty 2

## 2014-04-15 MED ORDER — MIDAZOLAM HCL 2 MG/2ML IJ SOLN
INTRAMUSCULAR | Status: AC
  Administered 2014-04-15: 2 mg
  Filled 2014-04-15: qty 2

## 2014-04-15 MED ORDER — DILTIAZEM HCL 100 MG IV SOLR
5.0000 mg/h | INTRAVENOUS | Status: DC
  Administered 2014-04-15 – 2014-04-16 (×4): 5 mg/h via INTRAVENOUS
  Filled 2014-04-15: qty 100

## 2014-04-15 MED ORDER — INSULIN ASPART 100 UNIT/ML ~~LOC~~ SOLN
0.0000 [IU] | SUBCUTANEOUS | Status: DC
  Administered 2014-04-15: 2 [IU] via SUBCUTANEOUS
  Administered 2014-04-16: 1 [IU] via SUBCUTANEOUS
  Administered 2014-04-16 (×2): 2 [IU] via SUBCUTANEOUS
  Administered 2014-04-16: 1 [IU] via SUBCUTANEOUS
  Administered 2014-04-16: 2 [IU] via SUBCUTANEOUS
  Administered 2014-04-16 (×2): 1 [IU] via SUBCUTANEOUS
  Administered 2014-04-17 (×5): 2 [IU] via SUBCUTANEOUS
  Administered 2014-04-18 – 2014-04-19 (×7): 1 [IU] via SUBCUTANEOUS
  Administered 2014-04-19: 2 [IU] via SUBCUTANEOUS
  Administered 2014-04-19 – 2014-04-23 (×21): 1 [IU] via SUBCUTANEOUS

## 2014-04-15 NOTE — H&P (Signed)
PULMONARY / CRITICAL CARE MEDICINE   Name: Patrick Lane MRN: [id number] DOB: 07-28-1927    ADMISSION DATE:  04/04/2014 CONSULTATION DATE:  03/22/2014  REFERRING MD :  EDP  CHIEF COMPLAINT:  SAH - Syncope  INITIAL PRESENTATION: 80 yo male with cancer, and multiple other medical problems presented to [facility] s/p fall/syncopal episode with LOC, in ED found to have AMS and CT head positive for acute SAH. Neurosurgery does not want surgery at this time. PCCM to admit.   STUDIES:  CT head 9/28 - Extensive subarachnoid hemorrhage throughout the left Calvarium, small subdural hematoma on the left, small tentorial subdural hematoma on the left as well. No midline shift CT c-spine 9/28 - No fracture or spondylolisthesis. Calcification in both carotid arteries CT abdomen/pelv - left bladder mass (larger than prior study), L adrenal mass (new), Left hydro, ureteral obstruction (new). Enlarged retroperitoneal nd elft pelvic sidewall lymph nodes. AAA with little change from prior study.    SIGNIFICANT EVENTS: 9/28 > admitted for [facility], likely 2nd to syncopal episode. Traumatic SAH noted   HISTORY OF PRESENT ILLNESS:  80 year old male with PHM as outlined below, which includes bladder Ca (s/p chemo, radiation. Last therapy 2-3 months ago), AAA, DM, PAF. He has been living at home with a live in helper. Family reports they were told his cancer was gone and that he is pretty functional. Still drives, cooks sometimes, etc. 9/28 he was at the store with his helper. He had an unwitnessed fall with subsequent loss of consciousness. He was brought to ED where his mental status was altered. A CT scan of his head shows extensive Left SAH. He was seen by neurosurgery in ED and they do not feel that surgical intervention is warranted at this time. They recommended close monitoring in ICU. PCCM consulted for admission.   PAST MEDICAL HISTORY :   has a past medical history of COPD (chronic obstructive pulmonary disease);  Hyperlipidemia; Type 2 diabetes mellitus; Adult bronchiectasis; Meatal stenosis; History of atrial fibrillation without current medication; AAA (abdominal aortic aneurysm); Productive cough; Dyspnea on exertion; GERD (gastroesophageal reflux disease); History of gastric ulcer; Foot ulcer, left; Arthritis; Bursitis of shoulder; Frequency of urination; Urgency of urination; Nocturia; Wears glasses; and Bladder cancer.  has past surgical history that includes Total knee arthroplasty (Right, 12-05-2002); EXCISION RIGHT SAPHENOUS NEUROMA/ RIGHT KNEE ARTHROSCOPIC LYSIS ADHESIONS AND MANIPULATION (05-29-2003); Shoulder arthroscopy with rotator cuff repair and subacromial decompression (Left, 03-02-2005); ORIF COMPOUND RIGHT FEMUR FX (1970'S); transthoracic echocardiogram (12-07-2002); Appendectomy (AGE 43); Cataract extraction w/ intraocular lens  implant, bilateral; Retinal detachment surgery (Right, 1980'S); Cystoscopy with urethral dilatation (N/A, 05/14/2013); Circumcision (N/A, 05/14/2013); Transurethral resection of bladder tumor (N/A, 05/14/2013); Transurethral resection of bladder tumor (N/A, 06/25/2013); Esophagogastroduodenoscopy (Left, 09/26/2013); and Colonoscopy (N/A, 09/26/2013). Prior to Admission medications   Medication Sig Start Date End Date Taking? Authorizing Provider  AVODART 0.5 MG capsule Take 0.5 mg by mouth every morning.  09/17/11  Yes Historical Provider, MD  dextromethorphan-guaiFENesin (MUCINEX DM) 30-600 MG per 12 hr tablet Take 2 tablets by mouth 2 (two) times daily. 08/31/13  Yes Janece Canterbury, MD  HYDROcodone-acetaminophen (NORCO/VICODIN) 5-325 MG per tablet Take 1-2 tablets by mouth every 4 (four) hours as needed for moderate pain.   Yes Historical Provider, MD  metFORMIN (GLUCOPHAGE) 500 MG tablet Take 1,000 mg by mouth 2 (two) times daily with a meal.   Yes Historical Provider, MD   Allergies  Allergen Reactions  . Celebrex [Celecoxib] Hives  FAMILY HISTORY:  indicated that  his mother is deceased. He indicated that his father is deceased.  SOCIAL HISTORY:  reports that he quit smoking about 14 years ago. His smoking use included Cigarettes. He has a 105 pack-year smoking history. He has quit using smokeless tobacco. His smokeless tobacco use included Chew. He reports that he does not drink alcohol or use illicit drugs.  REVIEW OF SYSTEMS:  Unable due to encephalopathy.   SUBJECTIVE:   VITAL SIGNS: Temp:  [97.5 F (36.4 C)] 97.5 F (36.4 C) (09/28 1412) Pulse Rate:  [42-297] 135 (09/28 1845) Resp:  [17-32] 18 (09/28 1830) BP: (122-189)/(57-137) 174/84 mmHg (09/28 1845) SpO2:  [83 %-100 %] 100 % (09/28 1845) Weight:  [81.647 kg (180 lb)] 81.647 kg (180 lb) (09/28 1412) HEMODYNAMICS:   VENTILATOR SETTINGS:   INTAKE / OUTPUT: No intake or output data in the 24 hours ending 04/09/2014 1909  PHYSICAL EXAMINATION: General:  Elderly male in moderate distress Neuro:  Spontaneously awake, alert, acute delerium. Minimally follows commands. Right weakness 3/5 strength  HEENT:  [place], C-collar in place, no tenderness Cardiovascular:  Tachy, appears regular then irregular, s1 s2 IRT Lungs:  Resp's even, mildly labored on 100% NRB Abdomen:  Soft, generalized tenderness with guarding, non-distended Musculoskeletal:  No acute deformity Skin:  Intact  LABS:  CBC  Recent Labs Lab 04/03/2014 1408  WBC 13.8*  HGB 10.4*  HCT 31.9*  PLT 365   Coag's  Recent Labs Lab 04/14/2014 1408  INR 1.19   BMET  Recent Labs Lab 04/11/2014 1408  NA 136*  K 4.4  CL 95*  CO2 25  BUN 24*  CREATININE 1.66*  GLUCOSE 199*   Electrolytes  Recent Labs Lab 03/28/2014 1408  CALCIUM 9.6   Sepsis Markers No results found for this basename: LATICACIDVEN, PROCALCITON, O2SATVEN,  in the last 168 hours ABG No results found for this basename: PHART, PCO2ART, PO2ART,  in the last 168 hours Liver Enzymes No results found for this basename: AST, ALT, ALKPHOS, BILITOT, ALBUMIN,   in the last 168 hours Cardiac Enzymes No results found for this basename: TROPONINI, PROBNP,  in the last 168 hours Glucose No results found for this basename: GLUCAP,  in the last 168 hours  Imaging No results found.   ASSESSMENT / PLAN:  PULMONARY OETT >  A: Acute hypoxemic respiratory failure  H/o COPD R/o PE  P:   Supplemental O2 to maintain SpO2 greater than 92% May require intubation STAT ABG STAT CXR CT chest now, r/o PE  CARDIOVASCULAR CVL >  A:  Hypertensive emergency PAF AAA - minimal change from prior CT Syncope, unclear etiology P:  Cardizem gtt , bolus Target SBP < 150 mm/Hg NO anticoagulation Continuous cardiac monitoring May need CVL Trop Echo Carotid dopplers Tele R/o PE CT  RENAL A:   AKI - baseline creat 1 ATN? Bladder cancer with mets P:   Hydrate Follow BMP Pos balance goals  GASTROINTESTINAL A:  Bladder cancer with mets H/o GERD  P:   SUP: IV protonix NPO LFt follow up  HEMATOLOGIC A:   Anemia, chronic Leukocytosis  P:  Follow CBC scd No heparin  INFECTIOUS A:   SIRS - no obvious source immunocompromised host , s/p chemo, rads months ago P:   BCx2 9/28 >>> Check UA UC 9/28 >>> Monitor off ABX Trend WBC and fever curve  ENDOCRINE A:   DM  P:   CBG monitoring SSI Check cortisol if drops BP or glu  NEUROLOGIC A:   Acute traumatic SAH Syncope R/o ligamentous injury neck  P:   RASS goal: 0 Neurosurgery following Repeat CT head in AM Neuro checks See cvs sections for work up Keep collar as unable to clear clinically, CT neg   Family updated: 9/28 - Grandson [facility]) is aware of current situation and severity. Agrees to no code blue, but would still want elective intubation if needed. No ETT during cardiac arrest   Interdisciplinary Family Meeting v Palliative Care Meeting:   Georgann Housekeeper, ACNP [place] [facility] Pager [phone] or [phone]   TODAY'S SUMMARY: CT  needed to r/o PE, repeat ct in am , likely needs ett, Cardizem needed   I have personally obtained a history, examined the patient, evaluated laboratory and imaging results, formulated the assessment and plan and placed orders. CRITICAL CARE: The patient is critically ill with multiple organ systems failure and requires high complexity decision making for assessment and support, frequent evaluation and titration of therapies, application of advanced monitoring technologies and extensive interpretation of multiple databases. Critical Care Time devoted to patient care services described in this note is 40 minutes.    Lavon Paganini. Titus Mould, MD, [place] Pgr: [facility]

## 2014-04-15 NOTE — Progress Notes (Signed)
[facility] Progress Note Patient Name: Patrick Lane DOB: 04-18-1928 MRN: [id number]   Date of Service  04/01/2014  HPI/Events of Note    eICU Interventions  Will attempt  place foley catheter. Note CT scan with evidence hydroureter and hydrourethra. May require urology consultation to place,.       Intervention Category Minor Interventions: Other:  Shaasia Odle S. 03/29/2014, 10:04 PM

## 2014-04-15 NOTE — ED Notes (Signed)
To ct

## 2014-04-15 NOTE — Procedures (Signed)
Intubation Procedure Note Patrick Lane [id number] 10/28/1927  Procedure: Intubation Indications: Respiratory insufficiency  Procedure Details Consent: Risks of procedure as well as the alternatives and risks of each were explained to the (patient/caregiver).  Consent for procedure obtained. Time Out: Verified patient identification, verified procedure, site/side was marked, verified correct patient position, special equipment/implants available, medications/allergies/relevent history reviewed, required imaging and test results available.  Performed  Maximum sterile technique was used including cap, gloves, gown, hand hygiene and mask.  MAC and 4 Collar on, neck stablized    Evaluation Hemodynamic Status: BP stable throughout; O2 sats: stable throughout Patient's Current Condition: stable Complications: No apparent complications Patient did tolerate procedure well. Chest X-ray ordered to verify placement.  CXR: pending.   Raylene Miyamoto. 04/02/2014  Upon glidescope eval, secretions thick all over cords, pus Asp? Add abx, sputum  Lavon Paganini. Titus Mould, MD, [place] Pgr: [place] [facility]

## 2014-04-15 NOTE — ED Notes (Signed)
Pt remains supine pt with decreasing o2 sats er tech notified rn and  Pt placed on 10L NRB O2 sats now 100%

## 2014-04-15 NOTE — ED Notes (Signed)
Per Dr. Mingo Amber scan patient Abdomen and pelvis without current labs ( BUN/CREA)

## 2014-04-15 NOTE — Consult Note (Signed)
Reason for Consult: Left subarachnoid hemorrhage, subdural hematoma Referring Physician: Dr. Carin Hock Patrick Lane is an 81 y.o. male.  HPI: The patient is an 80 year old white male with multiple medical problems including COPD, diabetes mellitus, hyperlipidemia, abdominal aortic aneurysm, bladder cancer, atrial fibrillation etc. who by report passed out walking. He was brought to the [facility] emergency department or a head CT demonstrated a left subarachnoid hemorrhage and subdural hematoma. A neurosurgical consultation was requested. The patient is not on any anticoagulants. He is being admitted by critical care for further workup of the syncope.  Presently the patient is accompanied by some friends. He complains of chest pain.  Past Medical History  Diagnosis Date  . COPD (chronic obstructive pulmonary disease)   . Hyperlipidemia   . Type 2 diabetes mellitus   . Adult bronchiectasis   . Meatal stenosis   . History of atrial fibrillation without current medication     POST OP EPISODE 2004   . AAA (abdominal aortic aneurysm)     LAST DUPLEX ULTRASOUND 06-22-2010  3.7x3.6  . Productive cough   . Dyspnea on exertion   . GERD (gastroesophageal reflux disease)   . History of gastric ulcer   . Foot ulcer, left     BOTTOM OF LITTLE TOE , MONITORED BY PCP  DSG DAILY W/ ANTIBIOTIC OINTMENT AND EPSOM SALT SOAKS  . Arthritis   . Bursitis of shoulder     BOTH  . Frequency of urination   . Urgency of urination   . Nocturia   . Wears glasses   . Bladder cancer     DX  OCT 2014 W/ INVASIVE HIGH GRADE UROTHELIAL BLADDER CARCINOMA (ONCOLOGIST--  DR Alen Blew)    Past Surgical History  Procedure Laterality Date  . Total knee arthroplasty Right 12-05-2002  . Excision right saphenous neuroma/ right knee arthroscopic lysis adhesions and manipulation  05-29-2003  . Shoulder arthroscopy with rotator cuff repair and subacromial decompression Left 03-02-2005    RESECTION OPEN DISTAL CLAVICLE    . Orif compound right femur fx  1970'S    AND RIGHT PATELLECTOMY /  HARDWARE REMOVED LATER  . Transthoracic echocardiogram  12-07-2002    NORMAL LV/ EF 55-65%  . Appendectomy  AGE 77  . Cataract extraction w/ intraocular lens  implant, bilateral    . Retinal detachment surgery Right 1980'S  . Cystoscopy with urethral dilatation N/A 05/14/2013    Procedure: CYSTOSCOPY WITH MEATAL DILATION ;  Surgeon: Bernestine Amass, MD;  Location: [facility];  Service: Urology;  Laterality: N/A;  . Circumcision N/A 05/14/2013    Procedure: CIRCUMCISION ADULT;  Surgeon: Bernestine Amass, MD;  Location: [facility];  Service: Urology;  Laterality: N/A;  . Transurethral resection of bladder tumor N/A 05/14/2013    Procedure: TRANSURETHRAL RESECTION OF BLADDER TUMOR (TURBT);  Surgeon: Bernestine Amass, MD;  Location: [facility];  Service: Urology;  Laterality: N/A;  . Transurethral resection of bladder tumor N/A 06/25/2013    Procedure: REPEAT TRANSURETHRAL RESECTION OF BLADDER TUMOR (TURBT);  Surgeon: Bernestine Amass, MD;  Location: [facility];  Service: Urology;  Laterality: N/A;  . Esophagogastroduodenoscopy Left 09/26/2013    Procedure: ESOPHAGOGASTRODUODENOSCOPY (EGD);  Surgeon: Arta Silence, MD;  Location: [facility];  Service: Endoscopy;  Laterality: Left;  . Colonoscopy N/A 09/26/2013    Procedure: COLONOSCOPY;  Surgeon: Arta Silence, MD;  Location: [facility];  Service: Endoscopy;  Laterality: N/A;    Family History  Problem Relation Age of Onset  . Heart disease Mother   . Cancer Mother     and 3 sisters and 2 brothers but unsure of type.    Social History:  reports that he quit smoking about 14 years ago. His smoking use included Cigarettes. He has a 105 pack-year smoking history. He has quit using smokeless tobacco. His smokeless tobacco use included Chew. He reports that he does not drink alcohol or use illicit drugs.  Allergies:   Allergies  Allergen Reactions  . Celebrex [Celecoxib] Hives    Medications:  I have reviewed the patient's current medications. Prior to Admission:  (Not in a hospital admission) Scheduled: . pantoprazole (PROTONIX) IV  40 mg Intravenous QHS   Continuous: . sodium chloride    . sodium chloride    . diltiazem     And  . diltiazem (CARDIZEM) infusion     NIO:EVOJJK chloride Anti-infectives   None       Results for orders placed during the hospital encounter of 03/27/2014 (from the past 48 hour(s))  CBC     Status: Abnormal   Collection Time    04/12/2014  2:08 PM      Result Value Ref Range   WBC 13.8 (*) 4.0 - 10.5 K/uL   RBC 3.49 (*) 4.22 - 5.81 MIL/uL   Hemoglobin 10.4 (*) 13.0 - 17.0 g/dL   HCT 31.9 (*) 39.0 - 52.0 %   MCV 91.4  78.0 - 100.0 fL   MCH 29.8  26.0 - 34.0 pg   MCHC 32.6  30.0 - 36.0 g/dL   RDW 15.4  11.5 - 15.5 %   Platelets 365  150 - 400 K/uL  BASIC METABOLIC PANEL     Status: Abnormal   Collection Time    04/13/2014  2:08 PM      Result Value Ref Range   Sodium 136 (*) 137 - 147 mEq/L   Potassium 4.4  3.7 - 5.3 mEq/L   Chloride 95 (*) 96 - 112 mEq/L   CO2 25  19 - 32 mEq/L   Glucose, Bld 199 (*) 70 - 99 mg/dL   BUN 24 (*) 6 - 23 mg/dL   Creatinine, Ser 1.66 (*) 0.50 - 1.35 mg/dL   Calcium 9.6  8.4 - 10.5 mg/dL   GFR calc non Af Amer 36 (*) >90 mL/min   GFR calc Af Amer 42 (*) >90 mL/min   Comment: (NOTE)     The eGFR has been calculated using the CKD EPI equation.     This calculation has not been validated in all clinical situations.     eGFR's persistently <90 mL/min signify possible Chronic Kidney     Disease.   Anion gap 16 (*) 5 - 15  PROTIME-INR     Status: None   Collection Time    04/01/2014  2:08 PM      Result Value Ref Range   Prothrombin Time 15.1  11.6 - 15.2 seconds   INR 1.19  0.00 - 1.49  CK     Status: None   Collection Time    04/04/2014  2:08 PM      Result Value Ref Range   Total CK 146  7 - 232 U/L    Ct Head Wo  Contrast  03/21/2014   CLINICAL DATA:  Trauma; altered mental status  EXAM: CT HEAD WITHOUT CONTRAST  CT CERVICAL SPINE WITHOUT CONTRAST  TECHNIQUE: Multidetector CT imaging of the head and cervical spine  was performed following the standard protocol without intravenous contrast. Multiplanar CT image reconstructions of the cervical spine were also generated.  COMPARISON:  October 10, 2009  FINDINGS: CT HEAD FINDINGS  There is extensive subarachnoid hemorrhage throughout the left calvarium. There is some hemorrhage extending into the tentorium consistent with Stentor radial subdural hematoma. There is also a small subdural hematoma on the left in the temporal region with a maximum thickness of 5 mm. There is no appreciable midline shift.  There is underlying moderate generalized atrophy. There is mild diffuse atrophy with mild patchy small vessel disease in the centra semiovale bilaterally.  There is no demonstrable fracture ; the bony calvarium appears intact. The mastoid air cells are clear. There is rightward deviation of the nasal septum.  CT CERVICAL SPINE FINDINGS  There is no appreciable fracture or spondylolisthesis. Prevertebral soft tissues and predental space regions are normal. There is mild disc space narrowing at C5-6 and C6-7. There is multifocal osteoarthritic change with exit foraminal narrowing at multiple levels. These changes are most pronounced at C3-4 on the left, C4-5 on the left, and C5-6 on the right. No disc extrusion or stenosis. There is calcification in both carotid arteries.  IMPRESSION: CT head: Extensive subarachnoid hemorrhage throughout the left calvarium, most pronounced in the left temporal lobe with foci also present in the frontal and parietal lobes on the left. There is a small subdural hematoma on the left measuring 5 mm in maximal thickness. There is a small tentorial subdural hematoma on the left as well. No midline shift. Underlying atrophy with small vessel disease. A prior  lacunar type infarct in the left medial temporal lobe currently contains a focus of hemorrhage.  CT cervical spine: Multifocal osteoarthritic change. No fracture or spondylolisthesis. Calcification in both carotid arteries.  Critical Value/emergent results were called by telephone at the time of interpretation on 03/31/2014 at 3:59 pm to Dr. Evelina Bucy , who verbally acknowledged these results.   Electronically Signed   By: Lowella Grip M.D.   On: 04/01/2014 16:00   Ct Cervical Spine Wo Contrast  03/22/2014   CLINICAL DATA:  Trauma; altered mental status  EXAM: CT HEAD WITHOUT CONTRAST  CT CERVICAL SPINE WITHOUT CONTRAST  TECHNIQUE: Multidetector CT imaging of the head and cervical spine was performed following the standard protocol without intravenous contrast. Multiplanar CT image reconstructions of the cervical spine were also generated.  COMPARISON:  October 10, 2009  FINDINGS: CT HEAD FINDINGS  There is extensive subarachnoid hemorrhage throughout the left calvarium. There is some hemorrhage extending into the tentorium consistent with Stentor radial subdural hematoma. There is also a small subdural hematoma on the left in the temporal region with a maximum thickness of 5 mm. There is no appreciable midline shift.  There is underlying moderate generalized atrophy. There is mild diffuse atrophy with mild patchy small vessel disease in the centra semiovale bilaterally.  There is no demonstrable fracture ; the bony calvarium appears intact. The mastoid air cells are clear. There is rightward deviation of the nasal septum.  CT CERVICAL SPINE FINDINGS  There is no appreciable fracture or spondylolisthesis. Prevertebral soft tissues and predental space regions are normal. There is mild disc space narrowing at C5-6 and C6-7. There is multifocal osteoarthritic change with exit foraminal narrowing at multiple levels. These changes are most pronounced at C3-4 on the left, C4-5 on the left, and C5-6 on the right.  No disc extrusion or stenosis. There is calcification in both carotid arteries.  IMPRESSION: CT head: Extensive subarachnoid hemorrhage throughout the left calvarium, most pronounced in the left temporal lobe with foci also present in the frontal and parietal lobes on the left. There is a small subdural hematoma on the left measuring 5 mm in maximal thickness. There is a small tentorial subdural hematoma on the left as well. No midline shift. Underlying atrophy with small vessel disease. A prior lacunar type infarct in the left medial temporal lobe currently contains a focus of hemorrhage.  CT cervical spine: Multifocal osteoarthritic change. No fracture or spondylolisthesis. Calcification in both carotid arteries.  Critical Value/emergent results were called by telephone at the time of interpretation on 03/26/2014 at 3:59 pm to Dr. Evelina Bucy , who verbally acknowledged these results.   Electronically Signed   By: Lowella Grip M.D.   On: 03/22/2014 16:00   Ct Abdomen Pelvis W Contrast  03/21/2014   CLINICAL DATA:  Fall, found down, abdominal pain, not responding to questions ; per chart history of COPD, type 2 diabetes, hyperlipidemia, abdominal aortic aneurysm, bladder cancer  EXAM: CT ABDOMEN AND PELVIS WITH CONTRAST  TECHNIQUE: Multidetector CT imaging of the abdomen and pelvis was performed using the standard protocol following bolus administration of intravenous contrast. Sagittal and coronal MPR images reconstructed from axial data set.  CONTRAST:  65mL OMNIPAQUE IOHEXOL 300 MG/ML SOLN IV. No oral contrast administered.  COMPARISON:  01/21/2014  FINDINGS: Small LEFT pleural effusion.  Bibasilar atelectasis versus subtle infiltrate in LEFT lower lobe.  Atherosclerotic calcifications with aneurysmal dilatation of the infrarenal aorta 4.6 x 4.3 cm and 6.2 cm length image 38 previously 4.4 x 4.4 cm in axial dimensions.  Common iliac arteries measure 14 mm diameter RIGHT and 13 mm LEFT.  Liver, spleen,  pancreas, LEFT adrenal gland and RIGHT kidney normal appearance.  New RIGHT adrenal mass 2.8 x 1.8 cm question metastasis.  LEFT para-aortic adenopathy identified on the previous study is now ill defined measuring in aggregate 2.7 x 2.4 cm with scattered associated infiltrative changes question related to interval treatment for bladder cancer?  The infiltration in LEFT periaortic space is not felt to be due to the aortic aneurysm.  LEFT hydronephrosis and proximal hydroureter with delay in LEFT nephrogram and mild diffuse perinephric edema.  LEFT ureteral dilatation terminates at the LEFT periaortic adenopathy.  Enhancing soft tissue tumor identified at the LEFT posterolateral urinary bladder compatible of bladder cancer, increased in size since previous exam, now measuring approximately 4.0 cm in greatest dimension and up to 1.5 cm thick.  Diffuse infiltration of prevesical fat may reflect tumor extension into the prevesical space.  10 mm short axis aortocaval lymph node image 32 previously 8 mm.  Enlarged LEFT pelvic sidewall lymph node 11 mm short axis image 68.  No additional mass, ascites, free air, or definite osseous metastatic lesion.  Small focus of nonspecific gas is seen in the retrocrural space, of uncertain etiology image 9.  IMPRESSION: Increased size of LEFT lateral bladder mass with suspected extension into the prevesical space.  New LEFT adrenal mass consistent with metastasis.  New LEFT hydronephrosis and proximal hydroureter suspect related to ureteral obstruction secondary to inflammatory changes at previously identified LEFT para-aortic adenopathy, question undergoing chemotherapy? .  Enlarged retroperitoneal and LEFT pelvic sidewall lymph nodes.  Abdominal aortic aneurysm 4.3 x 4.6 x 6.2 cm little changed.  Small LEFT pleural effusion.   Electronically Signed   By: Lavonia Dana M.D.   On: 04/12/2014 16:20   Dg Pelvis Portable  04/07/2014  CLINICAL DATA:  Pain post trauma  EXAM: PORTABLE  PELVIS 1-2 VIEWS  COMPARISON:  None.  FINDINGS: There is fairly mild symmetric narrowing of both hip joints. There is no demonstrable fracture or dislocation. No erosive change. There is arterial vascular calcification bilaterally. There is periarticular osteoporosis bilaterally.  IMPRESSION: Periarticular osteoporosis and osteoarthritic change in both hip joints. No acute fracture or dislocation apparent.   Electronically Signed   By: Lowella Grip M.D.   On: 04/10/2014 15:04   Dg Chest Port 1 View  04/03/2014   CLINICAL DATA:  Status post fall today; history of COPD, tobacco use, diabetes, and atrial fibrillation  EXAM: PORTABLE CHEST - 1 VIEW  COMPARISON:  None.  FINDINGS: The lungs are adequately inflated. There is mildly increased prominence of the right hilum as compared to the previous study. The cardiac silhouette is normal in size. The pulmonary vascularity is not engorged. There is no pleural effusion or pneumothorax. The trachea is midline. There is a mildly displaced acute fracture the posterior aspect of the right fifth rib. There may be deformities of the lateral aspects of the third and fourth ribs.  IMPRESSION: 1. There is an acute mildly displaced fracture of the posterior aspect of the right fifth rib. There may be fractures of the lateral aspects of the right third and fourth ribs. There is no pneumothorax or pleural effusion. 2. There is no evidence of CHF. Mild hilar prominence on the right is noted and is more conspicuous than in the past. When the patient can tolerate the procedure, a PA and lateral chest x-ray would be useful in an effort to reassess the hilar structures.   Electronically Signed   By: David  Martinique   On: 03/28/2014 15:04    ROS as above the patient complains of some right-sided chest/rib pain Blood pressure 174/84, pulse 135, temperature 97.5 F (36.4 C), temperature source Oral, resp. rate 18, height 6' (1.829 m), weight 81.647 kg (180 lb), SpO2  100.00%. Physical Exam  General: A frail appearing 80 year old white male who is mildly confused  HEENT:, Cephalic. The patient's right pupil has had a iridectomy and is irregular. His right pupil is small and round. Extraocular muscles intact. He's wearing dentures.  Neck: Supple, without masses or deformities. He has some mild tenderness with range of motion. He is wearing a cervical orthosis.  Thorax: Symmetric   Heart: tachycardic  Abdomen: Soft  Back exam: Unremarkable  Neurologic exam: The patient is alert and oriented x1. He thinks his at home. Glasgow Coma Scale 14. Cranial nerve exam was limited but grossly normal except for presbycusis. Patient's strength is grossly normal his bilateral biceps, triceps, hand grip, quadriceps, gastrocnemius, dorsi flexors. Sensory function is intact to light touch sensation in his lower extremities.  I have reviewed the patient's head CT performed at [facility] today without contrast. He has a left-sided subarachnoid hemorrhage with a small tentorial subdural hematoma. There is no significant mass effect. I don't see any significant blood in the basal cisterns to indicate an aneurysmal subarachnoid hemorrhage.  I have also reviewed the patient's cervical CT performed at [facility] today. It demonstrates some mild age-appropriate degenerative changes.    Assessment/Plan: Traumatic subarachnoid hemorrhage: I have discussed situation with the patient and his friends. He does not need surgery now and frankly he is not a good surgical candidate. He will be admitted for observation and will plan to repeat his CAT scan tomorrow.  Atrial fibrillation, multiple medical problems,  metastatic bladder cancer, etc. noted  Juliauna Stueve D 04/13/2014, 6:57 PM

## 2014-04-15 NOTE — Progress Notes (Signed)
ANTIBIOTIC CONSULT NOTE - INITIAL  Pharmacy Consult for Zosyn Indication: aspiration PNA  Allergies  Allergen Reactions  . Celebrex [Celecoxib] Hives    Patient Measurements: Height: 6\' 1"  (185.4 cm) Weight: 156 lb 1.4 oz (70.8 kg) IBW/kg (Calculated) : 79.9 Adjusted Body Weight: n/a  Vital Signs: Temp: 98.6 F (37 C) (09/28 2000) Temp src: Axillary (09/28 2000) BP: 185/138 mmHg (09/28 2000) Pulse Rate: 131 (09/28 2000) Intake/Output from previous day:   Intake/Output from this shift:    Labs:  Recent Labs  03/31/2014 1408  WBC 13.8*  HGB 10.4*  PLT 365  CREATININE 1.66*   Estimated Creatinine Clearance: 33.2 ml/min (by C-G formula based on Cr of 1.66). No results found for this basename: VANCOTROUGH, VANCOPEAK, VANCORANDOM, GENTTROUGH, GENTPEAK, GENTRANDOM, TOBRATROUGH, TOBRAPEAK, TOBRARND, AMIKACINPEAK, AMIKACINTROU, AMIKACIN,  in the last 72 hours   Microbiology: No results found for this or any previous visit (from the past 720 hour(s)).  Medical History: Past Medical History  Diagnosis Date  . COPD (chronic obstructive pulmonary disease)   . Hyperlipidemia   . Type 2 diabetes mellitus   . Adult bronchiectasis   . Meatal stenosis   . History of atrial fibrillation without current medication     POST OP EPISODE 2004   . AAA (abdominal aortic aneurysm)     LAST DUPLEX ULTRASOUND 06-22-2010  3.7x3.6  . Productive cough   . Dyspnea on exertion   . GERD (gastroesophageal reflux disease)   . History of gastric ulcer   . Foot ulcer, left     BOTTOM OF LITTLE TOE , MONITORED BY PCP  DSG DAILY W/ ANTIBIOTIC OINTMENT AND EPSOM SALT SOAKS  . Arthritis   . Bursitis of shoulder     BOTH  . Frequency of urination   . Urgency of urination   . Nocturia   . Wears glasses   . Bladder cancer     DX  OCT 2014 W/ INVASIVE HIGH GRADE UROTHELIAL BLADDER CARCINOMA (ONCOLOGIST--  DR Alen Blew)    Assessment: 80 yo male with multiple medical problems admitted to [facility] s/p  fall with CT head positive for [facility]. Intubated for respiratory failure.  Pharmacy asked to begin empiric Zosyn for possible aspiration PNA.  Scr 1.66, est CrCl ~ 33 ml/min.   Goal of Therapy:  Resolution of infection  Plan:  1. Zosyn 3.375g IV q 8 hrs (extended interval infusion). 2. F/u renal function, cultures, and plan of care.  Uvaldo Rising, BCPS  Clinical Pharmacist Pager [phone]  04/06/2014 8:54 PM

## 2014-04-15 NOTE — ED Provider Notes (Signed)
CSN: [id number]     Arrival date & time 03/21/2014  1403 History   First MD Initiated Contact with Patient 04/14/2014 1407     Chief Complaint  Patient presents with  . Fall     (Consider location/radiation/quality/duration/timing/severity/associated sxs/prior Treatment) HPI Comments: Was walking in the house out of the car and passed out. Hit the cement. Has metastatic bladder cancer hx. Complaining of abdominal pain.  Caregiver here, stating he fell, hit his head on the concrete.  No seizure like activity noted.  Patient is a 80 y.o. male presenting with fall. The history is provided by the patient.  Fall This is a new problem. The current episode started less than 1 hour ago. The problem occurs constantly. The problem has not changed since onset.Pertinent negatives include no chest pain, no abdominal pain, no headaches and no shortness of breath. Nothing aggravates the symptoms. Nothing relieves the symptoms. He has tried nothing for the symptoms.    Past Medical History  Diagnosis Date  . COPD (chronic obstructive pulmonary disease)   . Hyperlipidemia   . Type 2 diabetes mellitus   . Adult bronchiectasis   . Meatal stenosis   . History of atrial fibrillation without current medication     POST OP EPISODE 2004   . AAA (abdominal aortic aneurysm)     LAST DUPLEX ULTRASOUND 06-22-2010  3.7x3.6  . Productive cough   . Dyspnea on exertion   . GERD (gastroesophageal reflux disease)   . History of gastric ulcer   . Foot ulcer, left     BOTTOM OF LITTLE TOE , MONITORED BY PCP  DSG DAILY W/ ANTIBIOTIC OINTMENT AND EPSOM SALT SOAKS  . Arthritis   . Bursitis of shoulder     BOTH  . Frequency of urination   . Urgency of urination   . Nocturia   . Wears glasses   . Bladder cancer     DX  OCT 2014 W/ INVASIVE HIGH GRADE UROTHELIAL BLADDER CARCINOMA (ONCOLOGIST--  DR Alen Blew)   Past Surgical History  Procedure Laterality Date  . Total knee arthroplasty Right 12-05-2002  . Excision  right saphenous neuroma/ right knee arthroscopic lysis adhesions and manipulation  05-29-2003  . Shoulder arthroscopy with rotator cuff repair and subacromial decompression Left 03-02-2005    RESECTION OPEN DISTAL CLAVICLE   . Orif compound right femur fx  1970'S    AND RIGHT PATELLECTOMY /  HARDWARE REMOVED LATER  . Transthoracic echocardiogram  12-07-2002    NORMAL LV/ EF 55-65%  . Appendectomy  AGE 46  . Cataract extraction w/ intraocular lens  implant, bilateral    . Retinal detachment surgery Right 1980'S  . Cystoscopy with urethral dilatation N/A 05/14/2013    Procedure: CYSTOSCOPY WITH MEATAL DILATION ;  Surgeon: Bernestine Amass, MD;  Location: [facility];  Service: Urology;  Laterality: N/A;  . Circumcision N/A 05/14/2013    Procedure: CIRCUMCISION ADULT;  Surgeon: Bernestine Amass, MD;  Location: [facility];  Service: Urology;  Laterality: N/A;  . Transurethral resection of bladder tumor N/A 05/14/2013    Procedure: TRANSURETHRAL RESECTION OF BLADDER TUMOR (TURBT);  Surgeon: Bernestine Amass, MD;  Location: [facility];  Service: Urology;  Laterality: N/A;  . Transurethral resection of bladder tumor N/A 06/25/2013    Procedure: REPEAT TRANSURETHRAL RESECTION OF BLADDER TUMOR (TURBT);  Surgeon: Bernestine Amass, MD;  Location: [facility];  Service: Urology;  Laterality: N/A;  . Esophagogastroduodenoscopy Left 09/26/2013  Procedure: ESOPHAGOGASTRODUODENOSCOPY (EGD);  Surgeon: Arta Silence, MD;  Location: [facility];  Service: Endoscopy;  Laterality: Left;  . Colonoscopy N/A 09/26/2013    Procedure: COLONOSCOPY;  Surgeon: Arta Silence, MD;  Location: [facility];  Service: Endoscopy;  Laterality: N/A;   Family History  Problem Relation Age of Onset  . Heart disease Mother   . Cancer Mother     and 3 sisters and 2 brothers but unsure of type.   History  Substance Use Topics  . Smoking status: Former Smoker -- 1.50  packs/day for 70 years    Types: Cigarettes    Quit date: 07/20/1999  . Smokeless tobacco: Former Systems developer    Types: Chew  . Alcohol Use: No    Review of Systems  Constitutional: Negative for fever and chills.  Respiratory: Negative for shortness of breath.   Cardiovascular: Negative for chest pain.  Gastrointestinal: Negative for abdominal pain.  Neurological: Negative for headaches.  All other systems reviewed and are negative.     Allergies  Celebrex  Home Medications   Prior to Admission medications   Medication Sig Start Date End Date Taking? Authorizing Provider  Alum & Mag Hydroxide-Simeth (MAGIC MOUTHWASH W/LIDOCAINE) SOLN Take 10 mLs by mouth 4 (four) times daily as needed for mouth pain. 09/03/13   Lora Paula, MD  AVODART 0.5 MG capsule Take 0.5 mg by mouth every morning.  09/17/11   Historical Provider, MD  budesonide-formoterol (SYMBICORT) 80-4.5 MCG/ACT inhaler Inhale 2 puffs into the lungs 2 (two) times daily. 08/31/13   Janece Canterbury, MD  COMBIVENT RESPIMAT 20-100 MCG/ACT AERS Take 2 puffs by mouth every 6 (six) hours as needed for wheezing.  10/04/11   Historical Provider, MD  dextromethorphan-guaiFENesin (MUCINEX DM) 30-600 MG per 12 hr tablet Take 2 tablets by mouth 2 (two) times daily. 08/31/13   Janece Canterbury, MD  diphenoxylate-atropine (LOMOTIL) 2.5-0.025 MG per tablet Take 1-2 tablets by mouth 4 (four) times daily as needed for diarrhea or loose stools. 09/03/13   Lora Paula, MD  feeding supplement, GLUCERNA SHAKE, (GLUCERNA SHAKE) LIQD Take 237 mLs by mouth 3 (three) times daily between meals. 08/31/13   Janece Canterbury, MD  ferrous sulfate 325 (65 FE) MG tablet Take 1 tablet (325 mg total) by mouth 2 (two) times daily with a meal. 09/27/13   Costin Karlyne Greenspan, MD  HYDROcodone-acetaminophen (NORCO/VICODIN) 5-325 MG per tablet Take 1-2 tablets by mouth every 4 (four) hours as needed for moderate pain.    Historical Provider, MD  metFORMIN (GLUCOPHAGE) 500  MG tablet Take 1,000 mg by mouth 2 (two) times daily with a meal.    Historical Provider, MD  Multiple Vitamin (MULTIVITAMIN) tablet Take 1 tablet by mouth daily.    Historical Provider, MD  oxybutynin (DITROPAN XL) 10 MG 24 hr tablet Take 1 tablet (10 mg total) by mouth daily. 09/14/13   Lora Paula, MD  saccharomyces boulardii (FLORASTOR) 250 MG capsule Take 1 capsule (250 mg total) by mouth 2 (two) times daily. 08/31/13   Janece Canterbury, MD   There were no vitals taken for this visit. Physical Exam  Constitutional: He is oriented to person, place, and time. He appears well-developed and well-nourished. No distress.  HENT:  Head: Normocephalic and atraumatic.  Mouth/Throat: No oropharyngeal exudate.  Eyes: EOM are normal. Pupils are equal, round, and reactive to light.  Neck: Normal range of motion. Neck supple.  Cardiovascular: Normal rate and regular rhythm.  Exam reveals no friction rub.  No murmur heard. Pulmonary/Chest: Effort normal and breath sounds normal. No respiratory distress. He has no wheezes. He has no rales.  Abdominal: He exhibits no distension. There is tenderness (diffuse, lower). There is no rebound.  Musculoskeletal: Normal range of motion. He exhibits no edema.  Neurological: He is alert and oriented to person, place, and time.  Skin: He is not diaphoretic.    ED Course  Procedures (including critical care time) Labs Review Labs Reviewed  CBC  BASIC METABOLIC PANEL  PROTIME-INR    Imaging Review Dg Pelvis Portable  04/03/2014   CLINICAL DATA:  Pain post trauma  EXAM: PORTABLE PELVIS 1-2 VIEWS  COMPARISON:  None.  FINDINGS: There is fairly mild symmetric narrowing of both hip joints. There is no demonstrable fracture or dislocation. No erosive change. There is arterial vascular calcification bilaterally. There is periarticular osteoporosis bilaterally.  IMPRESSION: Periarticular osteoporosis and osteoarthritic change in both hip joints. No acute fracture  or dislocation apparent.   Electronically Signed   By: Lowella Grip M.D.   On: 04/09/2014 15:04   Dg Chest Port 1 View  04/16/2014   CLINICAL DATA:  Status post fall today; history of COPD, tobacco use, diabetes, and atrial fibrillation  EXAM: PORTABLE CHEST - 1 VIEW  COMPARISON:  None.  FINDINGS: The lungs are adequately inflated. There is mildly increased prominence of the right hilum as compared to the previous study. The cardiac silhouette is normal in size. The pulmonary vascularity is not engorged. There is no pleural effusion or pneumothorax. The trachea is midline. There is a mildly displaced acute fracture the posterior aspect of the right fifth rib. There may be deformities of the lateral aspects of the third and fourth ribs.  IMPRESSION: 1. There is an acute mildly displaced fracture of the posterior aspect of the right fifth rib. There may be fractures of the lateral aspects of the right third and fourth ribs. There is no pneumothorax or pleural effusion. 2. There is no evidence of CHF. Mild hilar prominence on the right is noted and is more conspicuous than in the past. When the patient can tolerate the procedure, a PA and lateral chest x-ray would be useful in an effort to reassess the hilar structures.   Electronically Signed   By: David  Martinique   On: 03/19/2014 15:04     EKG Interpretation   Date/Time:  Monday April 15 2014 14:10:06 EDT Ventricular Rate:  125 PR Interval:  143 QRS Duration: 142 QT Interval:  372 QTC Calculation: 536 R Axis:   -77 Text Interpretation:  Sinus tachycardia Multiple premature complexes, vent   RBBB and LAFB Similar to prior Confirmed by Mingo Amber  MD, [place] ([id number]) on  04/02/2014 3:14:34 PM     EMERGENCY DEPARTMENT [place] ABD/AORTA EXAM Study: Limited Ultrasound of the Abdominal Aorta.  INDICATIONS:Abdominal pain and Age>55 Indication: Multiple views of the abdominal aorta are obtained from the diaphragmatic hiatus to the aortic bifurcation in  transverse and sagittal planes with a multi- Frequency probe.  PERFORMED BY: Myself  IMAGES ARCHIVED?: Yes  FINDINGS: Free fluid absent, max diameter of aorta 4.3 cm  LIMITATIONS:  Abdominal pain  INTERPRETATION:  Abdominal aortic aneurysm present and Abdominal free fluid absent  COMMENT:  Limited subxyphoid view due abdominal pain. Unable to get sagittal views of the aorta.   CRITICAL CARE Performed by: Osvaldo Shipper   Total critical care time: 30 minutes  Critical care time was exclusive of separately billable procedures and treating other patients.  Critical care was necessary to treat or prevent imminent or life-threatening deterioration.  Critical care was time spent personally by me on the following activities: development of treatment plan with patient and/or surrogate as well as nursing, discussions with consultants, evaluation of patient's response to treatment, examination of patient, obtaining history from patient or surrogate, ordering and performing treatments and interventions, ordering and review of laboratory studies, ordering and review of radiographic studies, pulse oximetry and re-evaluation of patient's condition.  MDM   Final diagnoses:  Subarachnoid hemorrhage  Syncope, unspecified syncope type    38M presents s/p fall. Fell after walking to the house. Cannot remember any preceding symptoms. No fevers, no CP, no SOB. No N/V/D. Hx of metastatic bladder cancer.  Bedside US without free fluid in the abdomen. No spinal tenderness except in C spine. Will scan his head, c-spine, abdomen.   Afib w/ RVR on the monitor intermittently, known hx of same, not on anticoagulated. CT Head shows SAH, I spoke with Dr. Arnoldo Morale - he feels it is likely post-traumatic. Critical Care admitting.    Evelina Bucy, MD 03/31/2014 [phone]

## 2014-04-15 NOTE — ED Notes (Signed)
FOUND BY CAREGIVER AT [place] ON SIDEWALK UNWITNESSED [facility] LENGTH OF TIME DOWN

## 2014-04-16 ENCOUNTER — Inpatient Hospital Stay (HOSPITAL_COMMUNITY): Payer: Medicare HMO

## 2014-04-16 DIAGNOSIS — J9601 Acute respiratory failure with hypoxia: Secondary | ICD-10-CM

## 2014-04-16 DIAGNOSIS — J449 Chronic obstructive pulmonary disease, unspecified: Secondary | ICD-10-CM

## 2014-04-16 DIAGNOSIS — J9600 Acute respiratory failure, unspecified whether with hypoxia or hypercapnia: Secondary | ICD-10-CM

## 2014-04-16 DIAGNOSIS — N289 Disorder of kidney and ureter, unspecified: Secondary | ICD-10-CM

## 2014-04-16 DIAGNOSIS — R55 Syncope and collapse: Secondary | ICD-10-CM

## 2014-04-16 LAB — BLOOD GAS, ARTERIAL
Acid-Base Excess: 1.1 mmol/L (ref 0.0–2.0)
BICARBONATE: 25 meq/L — AB (ref 20.0–24.0)
Drawn by: 347621
FIO2: 0.4 %
O2 Saturation: 93.5 %
PATIENT TEMPERATURE: 98.6
PEEP/CPAP: 5 cmH2O
PH ART: 7.422 (ref 7.350–7.450)
RATE: 16 resp/min
TCO2: 26.2 mmol/L (ref 0–100)
VT: 640 mL
pCO2 arterial: 39.1 mmHg (ref 35.0–45.0)
pO2, Arterial: 65.3 mmHg — ABNORMAL LOW (ref 80.0–100.0)

## 2014-04-16 LAB — GLUCOSE, CAPILLARY
GLUCOSE-CAPILLARY: 126 mg/dL — AB (ref 70–99)
GLUCOSE-CAPILLARY: 141 mg/dL — AB (ref 70–99)
GLUCOSE-CAPILLARY: 162 mg/dL — AB (ref 70–99)
GLUCOSE-CAPILLARY: 162 mg/dL — AB (ref 70–99)
Glucose-Capillary: 134 mg/dL — ABNORMAL HIGH (ref 70–99)
Glucose-Capillary: 134 mg/dL — ABNORMAL HIGH (ref 70–99)
Glucose-Capillary: 157 mg/dL — ABNORMAL HIGH (ref 70–99)

## 2014-04-16 LAB — CBC
HCT: 27.6 % — ABNORMAL LOW (ref 39.0–52.0)
Hemoglobin: 8.8 g/dL — ABNORMAL LOW (ref 13.0–17.0)
MCH: 28.9 pg (ref 26.0–34.0)
MCHC: 31.9 g/dL (ref 30.0–36.0)
MCV: 90.8 fL (ref 78.0–100.0)
PLATELETS: 316 10*3/uL (ref 150–400)
RBC: 3.04 MIL/uL — ABNORMAL LOW (ref 4.22–5.81)
RDW: 15.5 % (ref 11.5–15.5)
WBC: 10.4 10*3/uL (ref 4.0–10.5)

## 2014-04-16 LAB — BASIC METABOLIC PANEL
ANION GAP: 13 (ref 5–15)
BUN: 25 mg/dL — ABNORMAL HIGH (ref 6–23)
CALCIUM: 9 mg/dL (ref 8.4–10.5)
CO2: 27 mEq/L (ref 19–32)
Chloride: 98 mEq/L (ref 96–112)
Creatinine, Ser: 1.74 mg/dL — ABNORMAL HIGH (ref 0.50–1.35)
GFR calc Af Amer: 40 mL/min — ABNORMAL LOW (ref >90)
GFR, EST NON AFRICAN AMERICAN: 34 mL/min — AB (ref >90)
Glucose, Bld: 147 mg/dL — ABNORMAL HIGH (ref 70–99)
Potassium: 4.6 mEq/L (ref 3.7–5.3)
SODIUM: 138 meq/L (ref 137–147)

## 2014-04-16 LAB — TRIGLYCERIDES: Triglycerides: 137 mg/dL (ref <150)

## 2014-04-16 LAB — MAGNESIUM: MAGNESIUM: 1.7 mg/dL (ref 1.5–2.5)

## 2014-04-16 LAB — PHOSPHORUS: Phosphorus: 3.7 mg/dL (ref 2.3–4.6)

## 2014-04-16 MED ORDER — ACETAMINOPHEN 325 MG PO TABS
650.0000 mg | ORAL_TABLET | ORAL | Status: AC
  Administered 2014-04-16: 650 mg
  Filled 2014-04-16: qty 2

## 2014-04-16 MED ORDER — CHLORHEXIDINE GLUCONATE 0.12 % MT SOLN
15.0000 mL | Freq: Two times a day (BID) | OROMUCOSAL | Status: DC
  Administered 2014-04-16 – 2014-04-23 (×17): 15 mL via OROMUCOSAL
  Filled 2014-04-16 (×16): qty 15

## 2014-04-16 MED ORDER — VITAL HIGH PROTEIN PO LIQD
1000.0000 mL | ORAL | Status: DC
  Administered 2014-04-16 (×2): 1000 mL
  Administered 2014-04-17 (×2)
  Administered 2014-04-17 – 2014-04-19 (×3): 1000 mL
  Filled 2014-04-16 (×5): qty 1000

## 2014-04-16 MED ORDER — IPRATROPIUM-ALBUTEROL 0.5-2.5 (3) MG/3ML IN SOLN
3.0000 mL | RESPIRATORY_TRACT | Status: DC
  Administered 2014-04-16 – 2014-04-18 (×12): 3 mL via RESPIRATORY_TRACT
  Filled 2014-04-16 (×11): qty 3

## 2014-04-16 MED ORDER — CETYLPYRIDINIUM CHLORIDE 0.05 % MT LIQD
7.0000 mL | Freq: Four times a day (QID) | OROMUCOSAL | Status: DC
  Administered 2014-04-16 – 2014-04-24 (×32): 7 mL via OROMUCOSAL

## 2014-04-16 MED ORDER — DILTIAZEM 12 MG/ML ORAL SUSPENSION
30.0000 mg | Freq: Four times a day (QID) | ORAL | Status: DC
  Administered 2014-04-16 – 2014-04-17 (×4): 30 mg
  Filled 2014-04-16 (×8): qty 3

## 2014-04-16 NOTE — Consult Note (Signed)
Patient KN:[id number] Patrick Lane      DOB: 1928/06/08      PFX:[id number]     Consult Note from the Palliative Medicine Team at [place] Requested by: Patrick Patrick Lane     PCP: Patrick Coffin, MD Reason for Consultation:Clarification of [place] and options     Phone Number:[phone]  Assessment of patients Current state:  80 year old  male with multiple medical problems including COPD, diabetes mellitus, hyperlipidemia, abdominal aortic aneurysm, bladder cancer (most recent CT shows disease progression), atrial fibrillation who fell at home admitted and  a head CT demonstrated a left subarachnoid hemorrhage and subdural hematoma. A neurosurgical consultation was requested.   Family report continued physical and functional decline over the past several months.  Family faced with advanced directive decisions and anticipatory care needs.  Consult is for review of medical treatment options, clarification of goals of care and end of life issues, disposition and options, and symptom recommendation.  This NP Patrick Lane reviewed medical records, received report from team, assessed the patient and then meet at the patient's bedside along with (adopted son/HPOA) Patrick Lane and several other family members to discuss diagnosis prognosis, [place], EOL wishes disposition and options.  Family was able to access patient's AD document on the smart phone.  It states no extraordinary means if in an incurable state.   A detailed discussion was had today regarding advanced directives.  Concepts specific to code status, artifical feeding and hydration, continued IV antibiotics and rehospitalization was had.  The difference between a aggressive medical intervention path  and a palliative comfort care path for this patient at this time was had.  Values and goals of care important to patient and family were attempted to be elicited.  Concept of Hospice and Palliative Care were discussed  Natural  trajectory and expectations at EOL were discussed.  Questions and concerns addressed.  Hard Choices booklet left for review. Family encouraged to call with questions or concerns.  PMT will continue to support holistically.   Goals of Care: 1.  Code Status:  Partial -no chest compression or cardioversion   2. Scope of Treatment: Continue present medical interventions, family remains hopeful for improvement and "at least" the ability to wean from the ventilator.  Patrick Lane very much want to speak to the neurosurgeon.   I was able to speak with Patrick Lane and he plans to come to patient's room to speak with family sometime before 5pm today.  Family plans to re-meet me me tomorrow at 2pm  3. Disposition: Dependant on outcomes   4. Symptom Management:   1.  Altered mental status 2/2 SAH/SDH 2. Failure to thrive  5. Psychosocial:  Emotional support offered to family.  Th main decision maker Patrick Lane is having a very difficult time.  Large family and church support in place.  6. Spiritual:  Patrick Lane with family presently      Patient Documents Completed or Given: Document Given Completed  Advanced Directives Pkt    MOST yes   DNR    Gone from My Sight    Hard Choices yes     Brief HPI: 80 year old male with PHM for bladder Ca (s/p chemo, radiation. Last therapy 2-3 months ago), AAA, DM, PAF. He has been living at home with a live in help. He had an unwitnessed fall with subsequent loss of consciousness. He was brought to ED where his mental status was altered. A CT scan of his head shows extensive Left  SAH. He was seen by neurosurgery in ED and they do not feel that surgical intervention is warranted at this time. They recommended close monitoring in ICU. PCCM consulted for admission.  9/28 > admitted for [facility], likely 2nd to syncopal episode. Traumatic SAH noted  CT head 9/28 - Extensive subarachnoid hemorrhage throughout the left Calvarium, small subdural hematoma on the  left, small tentorial subdural hematoma on the left as well. No midline shift  CT c-spine 9/28 - No fracture or spondylolisthesis. Calcification in both carotid arteries  CT abdomen/pelv - left bladder mass (larger than prior study), L adrenal mass (new), Left hydro, ureteral obstruction (new). Enlarged retroperitoneal nd elft pelvic sidewall lymph nodes. AAA with little change from prior study.  CT 04-16-14 -Acute left subdural, subarachnoid, and intraparenchymal hemorrhage  demonstrating some progression since previous study.   HUD:JSHFWY to illicit, patient is intubated   PMH:  Past Medical History  Diagnosis Date  . COPD (chronic obstructive pulmonary disease)   . Hyperlipidemia   . Type 2 diabetes mellitus   . Adult bronchiectasis   . Meatal stenosis   . History of atrial fibrillation without current medication     POST OP EPISODE 2004   . AAA (abdominal aortic aneurysm)     LAST DUPLEX ULTRASOUND 06-22-2010  3.7x3.6  . Productive cough   . Dyspnea on exertion   . GERD (gastroesophageal reflux disease)   . History of gastric ulcer   . Foot ulcer, left     BOTTOM OF LITTLE TOE , MONITORED BY PCP  DSG DAILY W/ ANTIBIOTIC OINTMENT AND EPSOM SALT SOAKS  . Arthritis   . Bursitis of shoulder     BOTH  . Frequency of urination   . Urgency of urination   . Nocturia   . Wears glasses   . Bladder cancer     DX  OCT 2014 W/ INVASIVE HIGH GRADE UROTHELIAL BLADDER CARCINOMA (ONCOLOGIST--  Patrick Patrick Lane)     PSH: Past Surgical History  Procedure Laterality Date  . Total knee arthroplasty Right 12-05-2002  . Excision right saphenous neuroma/ right knee arthroscopic lysis adhesions and manipulation  05-29-2003  . Shoulder arthroscopy with rotator cuff repair and subacromial decompression Left 03-02-2005    RESECTION OPEN DISTAL CLAVICLE   . Orif compound right femur fx  1970'S    AND RIGHT PATELLECTOMY /  HARDWARE REMOVED LATER  . Transthoracic echocardiogram  12-07-2002    NORMAL  LV/ EF 55-65%  . Appendectomy  AGE 16  . Cataract extraction w/ intraocular lens  implant, bilateral    . Retinal detachment surgery Right 1980'S  . Cystoscopy with urethral dilatation N/A 05/14/2013    Procedure: CYSTOSCOPY WITH MEATAL DILATION ;  Surgeon: Bernestine Amass, MD;  Location: [facility];  Service: Urology;  Laterality: N/A;  . Circumcision N/A 05/14/2013    Procedure: CIRCUMCISION ADULT;  Surgeon: Bernestine Amass, MD;  Location: [facility];  Service: Urology;  Laterality: N/A;  . Transurethral resection of bladder tumor N/A 05/14/2013    Procedure: TRANSURETHRAL RESECTION OF BLADDER TUMOR (TURBT);  Surgeon: Bernestine Amass, MD;  Location: [facility];  Service: Urology;  Laterality: N/A;  . Transurethral resection of bladder tumor N/A 06/25/2013    Procedure: REPEAT TRANSURETHRAL RESECTION OF BLADDER TUMOR (TURBT);  Surgeon: Bernestine Amass, MD;  Location: [facility];  Service: Urology;  Laterality: N/A;  . Esophagogastroduodenoscopy Left 09/26/2013    Procedure: ESOPHAGOGASTRODUODENOSCOPY (EGD);  Surgeon: Gwyndolyn Saxon  Paulita Fujita, MD;  Location: [facility];  Service: Endoscopy;  Laterality: Left;  . Colonoscopy N/A 09/26/2013    Procedure: COLONOSCOPY;  Surgeon: Arta Silence, MD;  Location: [facility];  Service: Endoscopy;  Laterality: N/A;   I have reviewed the [place] and SH and  If appropriate update it with new information. Allergies  Allergen Reactions  . Celebrex [Celecoxib] Hives   Scheduled Meds: . antiseptic oral rinse  7 mL Mouth Rinse QID  . chlorhexidine  15 mL Mouth Rinse BID  . diltiazem  30 mg Per Tube 4 times per day  . insulin aspart  0-9 Units Subcutaneous 6 times per day  . ipratropium-albuterol  3 mL Nebulization Q4H  . pantoprazole (PROTONIX) IV  40 mg Intravenous QHS  . piperacillin-tazobactam (ZOSYN)  IV  3.375 g Intravenous 3 times per day   Continuous Infusions: . sodium chloride    . sodium  chloride 100 mL/hr at 04/16/14 0700  . propofol 20 mcg/kg/min (04/16/14 1100)   PRN Meds:.sodium chloride    BP 140/62  Pulse 92  Temp(Src) 99.6 F (37.6 C) (Axillary)  Resp 16  Ht 6\' 1"  (1.854 m)  Wt 73.5 kg (162 lb 0.6 oz)  BMI 21.38 kg/m2  SpO2 99%     Intake/Output Summary (Last 24 hours) at 04/16/14 1237 Last data filed at 04/16/14 1200  Gross per 24 hour  Intake 2077.29 ml  Output    590 ml  Net 1487.29 ml    Physical Exam:  General: intubated and sedated HEENT: noted ET tube, focal seizure activity mouth area Chest:   Bilateral equal air movement CVS: tachycardic Abdomen: soft NT +BS Ext: without edema Neuro: sedated  Labs: CBC    Component Value Date/Time   WBC 10.4 04/16/2014 0222   WBC 7.9 01/21/2014 1127   RBC 3.04* 04/16/2014 0222   RBC 3.87* 01/21/2014 1127   RBC 2.53* 09/23/2013 0505   HGB 8.8* 04/16/2014 0222   HGB 11.8* 01/21/2014 1127   HCT 27.6* 04/16/2014 0222   HCT 36.5* 01/21/2014 1127   PLT 316 04/16/2014 0222   PLT 238 01/21/2014 1127   MCV 90.8 04/16/2014 0222   MCV 94.3 01/21/2014 1127   MCH 28.9 04/16/2014 0222   MCH 30.5 01/21/2014 1127   MCHC 31.9 04/16/2014 0222   MCHC 32.3 01/21/2014 1127   RDW 15.5 04/16/2014 0222   RDW 15.8* 01/21/2014 1127   LYMPHSABS 1.1 01/21/2014 1127   LYMPHSABS 1.0 09/22/2013 2038   MONOABS 0.5 01/21/2014 1127   MONOABS 0.6 09/22/2013 2038   EOSABS 0.6* 01/21/2014 1127   EOSABS 0.1 09/22/2013 2038   BASOSABS 0.0 01/21/2014 1127   BASOSABS 0.0 09/22/2013 2038    BMET    Component Value Date/Time   NA 138 04/16/2014 0222   NA 140 01/21/2014 1127   K 4.6 04/16/2014 0222   K 4.5 01/21/2014 1127   CL 98 04/16/2014 0222   CO2 27 04/16/2014 0222   CO2 28 01/21/2014 1127   GLUCOSE 147* 04/16/2014 0222   GLUCOSE 96 01/21/2014 1127   BUN 25* 04/16/2014 0222   BUN 16.0 01/21/2014 1127   CREATININE 1.74* 04/16/2014 0222   CREATININE 1.0 01/21/2014 1127   CALCIUM 9.0 04/16/2014 0222   CALCIUM 10.2 01/21/2014 1127   GFRNONAA 34* 04/16/2014 0222   GFRAA  40* 04/16/2014 0222    CMP     Component Value Date/Time   NA 138 04/16/2014 0222   NA 140 01/21/2014 1127   K 4.6 04/16/2014  0222   K 4.5 01/21/2014 1127   CL 98 04/16/2014 0222   CO2 27 04/16/2014 0222   CO2 28 01/21/2014 1127   GLUCOSE 147* 04/16/2014 0222   GLUCOSE 96 01/21/2014 1127   BUN 25* 04/16/2014 0222   BUN 16.0 01/21/2014 1127   CREATININE 1.74* 04/16/2014 0222   CREATININE 1.0 01/21/2014 1127   CALCIUM 9.0 04/16/2014 0222   CALCIUM 10.2 01/21/2014 1127   PROT 7.4 01/21/2014 1127   PROT 7.3 09/23/2013 0505   ALBUMIN 3.6 01/21/2014 1127   ALBUMIN 2.9* 09/23/2013 0505   AST 14 01/21/2014 1127   AST 14 09/23/2013 0505   ALT 9 01/21/2014 1127   ALT 8 09/23/2013 0505   ALKPHOS 56 01/21/2014 1127   ALKPHOS 57 09/23/2013 0505   BILITOT 0.41 01/21/2014 1127   BILITOT 0.4 09/23/2013 0505   GFRNONAA 34* 04/16/2014 0222   GFRAA 40* 04/16/2014 0222     Time In Time Out Total Time Spent with Patient Total Overall Time  1400 1600 100 min 120 min    Greater than 50%  of this time was spent counseling and coordinating care related to the above assessment and plan.   Patrick Lessen NP  Palliative Medicine Team Team Phone # [phone] Pager [phone]  Discussed with Patrick Lane

## 2014-04-16 NOTE — Progress Notes (Signed)
Attempted to take patient for a chest CT to rule out PE as per MD. However once I contacted radiology they highly reccomended that the patient wait until after morning labs to be able to assess the patient's GFR. The patient already received contrast today once with a GFR of 37. I reported radiology's recommendation to MD and MD agreed to wait for scan until morning labs are drawn and reassess for chest CT then. Will continue to monitor.

## 2014-04-16 NOTE — Progress Notes (Signed)
UR completed.  Zailee Vallely, RN BSN [facility] CCM Trauma/Neuro ICU Case Manager [phone]  

## 2014-04-16 NOTE — Progress Notes (Signed)
Patient back from CT without any complications.

## 2014-04-16 NOTE — Progress Notes (Signed)
Thank you for consulting the Palliative Medicine Team at [facility] to meet your patient's and family's needs.   The reason that you asked [place] to see your patient is  For  Clarification of GOC and options  We have scheduled your patient for a meeting: tomorrow 04-17-14 at 2pm  The Surrogate decision make is: [name]  Other family members that need to be present: Multiple family members  Your patient is able/unable to [place] NP  Palliative Medicine Team Team Phone # [phone] Pager [phone]

## 2014-04-16 NOTE — Progress Notes (Signed)
[facility] Progress Note Patient Name: Patrick Lane DOB: 1928-03-31 MRN: [id number]   Date of Service  04/16/2014  HPI/Events of Note  Temp 101.6  eICU Interventions  Tylenol ordered     Intervention Category Intermediate Interventions: Other:  Ixchel Duck S. 04/16/2014, 8:05 PM

## 2014-04-16 NOTE — H&P (Signed)
PULMONARY / CRITICAL CARE MEDICINE   Name: Patrick Lane MRN: [id number] DOB: October 11, 1927    ADMISSION DATE:  04/03/2014 CONSULTATION DATE:  04/04/2014  REFERRING MD :  EDP  CHIEF COMPLAINT:  SAH - Syncope  INITIAL PRESENTATION: 80 year old male with PHM as outlined below, which includes bladder Ca (s/p chemo, radiation. Last therapy 2-3 months ago), AAA, DM, PAF. He has been living at home with a live in helper. Family reports they were told his cancer was gone and that he is pretty functional. Still drives, cooks sometimes, etc. 9/28 he was at the store with his helper. He had an unwitnessed fall with subsequent loss of consciousness. He was brought to ED where his mental status was altered. A CT scan of his head shows extensive Left SAH. He was seen by neurosurgery in ED and they do not feel that surgical intervention is warranted at this time. They recommended close monitoring in ICU. PCCM consulted for admission.  .   STUDIES/EVENT 9/28 > admitted for [facility], likely 2nd to syncopal episode. Traumatic SAH noted CT head 9/28 - Extensive subarachnoid hemorrhage throughout the left Calvarium, small subdural hematoma on the left, small tentorial subdural hematoma on the left as well. No midline shift CT c-spine 9/28 - No fracture or spondylolisthesis. Calcification in both carotid arteries CT abdomen/pelv - left bladder mass (larger than prior study), L adrenal mass (new), Left hydro, ureteral obstruction (new). Enlarged retroperitoneal nd elft pelvic sidewall lymph nodes. AAA with little change from prior study.      SUBJECTIVE:   04/16/14: intubated yesterday for resp distress. Concern for PE but CT angio held due to concern for high dye load and low GFR. He has hydro,making urine but creat worsning  VITAL SIGNS: Temp:  [97.5 F (36.4 C)-99.6 F (37.6 C)] 99.6 F (37.6 C) (09/29 0746) Pulse Rate:  [42-297] 95 (09/29 0900) Resp:  [16-34] 18 (09/29 0900) BP: (99-189)/(33-138) 137/62  mmHg (09/29 0900) SpO2:  [83 %-100 %] 99 % (09/29 0900) FiO2 (%):  [40 %-100 %] 40 % (09/29 0828) Weight:  [70.8 kg (156 lb 1.4 oz)-81.647 kg (180 lb)] 73.5 kg (162 lb 0.6 oz) (09/29 0318) HEMODYNAMICS:   VENTILATOR SETTINGS: Vent Mode:  [-] PRVC FiO2 (%):  [40 %-100 %] 40 % Set Rate:  [16 bmp] 16 bmp Vt Set:  [640 mL] 640 mL PEEP:  [5 cmH20] 5 cmH20 Plateau Pressure:  [18 cmH20-20 cmH20] 20 cmH20 INTAKE / OUTPUT:  Intake/Output Summary (Last 24 hours) at 04/16/14 3419 Last data filed at 04/16/14 0800  Gross per 24 hour  Intake 1636.79 ml  Output    315 ml  Net 1321.79 ml    PHYSICAL EXAMINATION: General:  Elderly male ilooks critically ill on vent Neuro: Agitated on NO sedation. RASS +1 to +2. Seems to follow commands  Right weakness 3/5 strength  HEENT:  [place], C-collar in place, no tenderness Cardiovascular:  Tachy, appears regular then irregular, s1 s2 IRT Lungs:  Resp's even, mildly labored on 100% NRB Abdomen:  Soft, generalized tenderness with guarding, non-distended Musculoskeletal:  No acute deformity Skin:  Intact  LABS: PULMONARY  Recent Labs Lab 04/01/2014 2218 04/16/14 0500  PHART 7.380 7.422  PCO2ART 43.6 39.1  PO2ART 382.0* 65.3*  HCO3 25.2* 25.0*  TCO2 26.5 26.2  O2SAT 99.8 93.5    CBC  Recent Labs Lab 03/20/2014 1408 04/16/14 0222  HGB 10.4* 8.8*  HCT 31.9* 27.6*  WBC 13.8* 10.4  PLT 365 316  COAGULATION  Recent Labs Lab 04/17/2014 1408  INR 1.19    CARDIAC   Recent Labs Lab 03/25/2014 1931  TROPONINI <0.30   No results found for this basename: PROBNP,  in the last 168 hours   CHEMISTRY  Recent Labs Lab 04/06/2014 1408 04/16/14 0222  NA 136* 138  K 4.4 4.6  CL 95* 98  CO2 25 27  GLUCOSE 199* 147*  BUN 24* 25*  CREATININE 1.66* 1.74*  CALCIUM 9.6 9.0  MG  --  1.7  PHOS  --  3.7   Estimated Creatinine Clearance: 32.9 ml/min (by C-G formula based on Cr of 1.74).   LIVER  Recent Labs Lab 04/01/2014 1408  INR 1.19      INFECTIOUS No results found for this basename: LATICACIDVEN, PROCALCITON,  in the last 168 hours   ENDOCRINE CBG (last 3)   Recent Labs  04/06/2014 2339 04/16/14 0317 04/16/14 0747  GLUCAP 162* 134* 141*         IMAGING x48h Ct Head Wo Contrast  04/10/2014   CLINICAL DATA:  Trauma; altered mental status  EXAM: CT HEAD WITHOUT CONTRAST  CT CERVICAL SPINE WITHOUT CONTRAST  TECHNIQUE: Multidetector CT imaging of the head and cervical spine was performed following the standard protocol without intravenous contrast. Multiplanar CT image reconstructions of the cervical spine were also generated.  COMPARISON:  October 10, 2009  FINDINGS: CT HEAD FINDINGS  There is extensive subarachnoid hemorrhage throughout the left calvarium. There is some hemorrhage extending into the tentorium consistent with Stentor radial subdural hematoma. There is also a small subdural hematoma on the left in the temporal region with a maximum thickness of 5 mm. There is no appreciable midline shift.  There is underlying moderate generalized atrophy. There is mild diffuse atrophy with mild patchy small vessel disease in the centra semiovale bilaterally.  There is no demonstrable fracture ; the bony calvarium appears intact. The mastoid air cells are clear. There is rightward deviation of the nasal septum.  CT CERVICAL SPINE FINDINGS  There is no appreciable fracture or spondylolisthesis. Prevertebral soft tissues and predental space regions are normal. There is mild disc space narrowing at C5-6 and C6-7. There is multifocal osteoarthritic change with exit foraminal narrowing at multiple levels. These changes are most pronounced at C3-4 on the left, C4-5 on the left, and C5-6 on the right. No disc extrusion or stenosis. There is calcification in both carotid arteries.  IMPRESSION: CT head: Extensive subarachnoid hemorrhage throughout the left calvarium, most pronounced in the left temporal lobe with foci also present in the  frontal and parietal lobes on the left. There is a small subdural hematoma on the left measuring 5 mm in maximal thickness. There is a small tentorial subdural hematoma on the left as well. No midline shift. Underlying atrophy with small vessel disease. A prior lacunar type infarct in the left medial temporal lobe currently contains a focus of hemorrhage.  CT cervical spine: Multifocal osteoarthritic change. No fracture or spondylolisthesis. Calcification in both carotid arteries.  Critical Value/emergent results were called by telephone at the time of interpretation on 04/14/2014 at 3:59 pm to Dr. Evelina Bucy , who verbally acknowledged these results.   Electronically Signed   By: Lowella Grip M.D.   On: 03/21/2014 16:00   Ct Cervical Spine Wo Contrast  03/26/2014   CLINICAL DATA:  Trauma; altered mental status  EXAM: CT HEAD WITHOUT CONTRAST  CT CERVICAL SPINE WITHOUT CONTRAST  TECHNIQUE: Multidetector CT imaging of the head  and cervical spine was performed following the standard protocol without intravenous contrast. Multiplanar CT image reconstructions of the cervical spine were also generated.  COMPARISON:  October 10, 2009  FINDINGS: CT HEAD FINDINGS  There is extensive subarachnoid hemorrhage throughout the left calvarium. There is some hemorrhage extending into the tentorium consistent with Stentor radial subdural hematoma. There is also a small subdural hematoma on the left in the temporal region with a maximum thickness of 5 mm. There is no appreciable midline shift.  There is underlying moderate generalized atrophy. There is mild diffuse atrophy with mild patchy small vessel disease in the centra semiovale bilaterally.  There is no demonstrable fracture ; the bony calvarium appears intact. The mastoid air cells are clear. There is rightward deviation of the nasal septum.  CT CERVICAL SPINE FINDINGS  There is no appreciable fracture or spondylolisthesis. Prevertebral soft tissues and predental space  regions are normal. There is mild disc space narrowing at C5-6 and C6-7. There is multifocal osteoarthritic change with exit foraminal narrowing at multiple levels. These changes are most pronounced at C3-4 on the left, C4-5 on the left, and C5-6 on the right. No disc extrusion or stenosis. There is calcification in both carotid arteries.  IMPRESSION: CT head: Extensive subarachnoid hemorrhage throughout the left calvarium, most pronounced in the left temporal lobe with foci also present in the frontal and parietal lobes on the left. There is a small subdural hematoma on the left measuring 5 mm in maximal thickness. There is a small tentorial subdural hematoma on the left as well. No midline shift. Underlying atrophy with small vessel disease. A prior lacunar type infarct in the left medial temporal lobe currently contains a focus of hemorrhage.  CT cervical spine: Multifocal osteoarthritic change. No fracture or spondylolisthesis. Calcification in both carotid arteries.  Critical Value/emergent results were called by telephone at the time of interpretation on 04/14/2014 at 3:59 pm to Dr. Evelina Bucy , who verbally acknowledged these results.   Electronically Signed   By: Lowella Grip M.D.   On: 03/29/2014 16:00   Ct Abdomen Pelvis W Contrast  04/10/2014   CLINICAL DATA:  Fall, found down, abdominal pain, not responding to questions ; per chart history of COPD, type 2 diabetes, hyperlipidemia, abdominal aortic aneurysm, bladder cancer  EXAM: CT ABDOMEN AND PELVIS WITH CONTRAST  TECHNIQUE: Multidetector CT imaging of the abdomen and pelvis was performed using the standard protocol following bolus administration of intravenous contrast. Sagittal and coronal MPR images reconstructed from axial data set.  CONTRAST:  106mL OMNIPAQUE IOHEXOL 300 MG/ML SOLN IV. No oral contrast administered.  COMPARISON:  01/21/2014  FINDINGS: Small LEFT pleural effusion.  Bibasilar atelectasis versus subtle infiltrate in LEFT lower  lobe.  Atherosclerotic calcifications with aneurysmal dilatation of the infrarenal aorta 4.6 x 4.3 cm and 6.2 cm length image 38 previously 4.4 x 4.4 cm in axial dimensions.  Common iliac arteries measure 14 mm diameter RIGHT and 13 mm LEFT.  Liver, spleen, pancreas, LEFT adrenal gland and RIGHT kidney normal appearance.  New RIGHT adrenal mass 2.8 x 1.8 cm question metastasis.  LEFT para-aortic adenopathy identified on the previous study is now ill defined measuring in aggregate 2.7 x 2.4 cm with scattered associated infiltrative changes question related to interval treatment for bladder cancer?  The infiltration in LEFT periaortic space is not felt to be due to the aortic aneurysm.  LEFT hydronephrosis and proximal hydroureter with delay in LEFT nephrogram and mild diffuse perinephric edema.  LEFT ureteral  dilatation terminates at the LEFT periaortic adenopathy.  Enhancing soft tissue tumor identified at the LEFT posterolateral urinary bladder compatible of bladder cancer, increased in size since previous exam, now measuring approximately 4.0 cm in greatest dimension and up to 1.5 cm thick.  Diffuse infiltration of prevesical fat may reflect tumor extension into the prevesical space.  10 mm short axis aortocaval lymph node image 32 previously 8 mm.  Enlarged LEFT pelvic sidewall lymph node 11 mm short axis image 68.  No additional mass, ascites, free air, or definite osseous metastatic lesion.  Small focus of nonspecific gas is seen in the retrocrural space, of uncertain etiology image 9.  IMPRESSION: Increased size of LEFT lateral bladder mass with suspected extension into the prevesical space.  New LEFT adrenal mass consistent with metastasis.  New LEFT hydronephrosis and proximal hydroureter suspect related to ureteral obstruction secondary to inflammatory changes at previously identified LEFT para-aortic adenopathy, question undergoing chemotherapy? .  Enlarged retroperitoneal and LEFT pelvic sidewall lymph  nodes.  Abdominal aortic aneurysm 4.3 x 4.6 x 6.2 cm little changed.  Small LEFT pleural effusion.   Electronically Signed   By: Lavonia Dana M.D.   On: 03/21/2014 16:20   Dg Pelvis Portable  03/21/2014   CLINICAL DATA:  Pain post trauma  EXAM: PORTABLE PELVIS 1-2 VIEWS  COMPARISON:  None.  FINDINGS: There is fairly mild symmetric narrowing of both hip joints. There is no demonstrable fracture or dislocation. No erosive change. There is arterial vascular calcification bilaterally. There is periarticular osteoporosis bilaterally.  IMPRESSION: Periarticular osteoporosis and osteoarthritic change in both hip joints. No acute fracture or dislocation apparent.   Electronically Signed   By: Lowella Grip M.D.   On: 04/01/2014 15:04   Dg Chest Port 1 View  03/26/2014   CLINICAL DATA:  Hypoxia  EXAM: PORTABLE CHEST - 1 VIEW  COMPARISON:  Study obtained earlier in the day  FINDINGS: Endotracheal tube tip is 3.1 cm above the carina. Nasogastric tube tip and side port are in the stomach. There is no demonstrable pneumothorax. There is a small area of infiltrate in the right base. Elsewhere lungs are clear. Heart size and pulmonary vascularity are normal. No adenopathy. There is acromioclavicular separation on the left.  IMPRESSION: Tube positions as described without pneumothorax. Small area of infiltrate right base. Acromioclavicular separation on left.   Electronically Signed   By: Lowella Grip M.D.   On: 03/26/2014 21:37   Dg Chest Port 1 View  04/16/2014   CLINICAL DATA:  Status post fall today; history of COPD, tobacco use, diabetes, and atrial fibrillation  EXAM: PORTABLE CHEST - 1 VIEW  COMPARISON:  None.  FINDINGS: The lungs are adequately inflated. There is mildly increased prominence of the right hilum as compared to the previous study. The cardiac silhouette is normal in size. The pulmonary vascularity is not engorged. There is no pleural effusion or pneumothorax. The trachea is midline. There is a  mildly displaced acute fracture the posterior aspect of the right fifth rib. There may be deformities of the lateral aspects of the third and fourth ribs.  IMPRESSION: 1. There is an acute mildly displaced fracture of the posterior aspect of the right fifth rib. There may be fractures of the lateral aspects of the right third and fourth ribs. There is no pneumothorax or pleural effusion. 2. There is no evidence of CHF. Mild hilar prominence on the right is noted and is more conspicuous than in the past. When the patient can  tolerate the procedure, a PA and lateral chest x-ray would be useful in an effort to reassess the hilar structures.   Electronically Signed   By: David  Martinique   On: 03/23/2014 15:04   Dg Abd Portable 1v  03/28/2014   CLINICAL DATA:  Enteric tube placement.  EXAM: PORTABLE ABDOMEN - 1 VIEW  COMPARISON:  04/01/2014.  FINDINGS: Enteric tube is present with the tip in the gastric fundus. Proximal side port in the proximal gastric fundus.  IMPRESSION: Nasogastric tube in the stomach.   Electronically Signed   By: Dereck Ligas M.D.   On: 04/04/2014 21:36       ASSESSMENT / PLAN:  PULMONARY OETT > 9/29 A: Acute hypoxemic respiratory failure  H/o COPD R/o PE   - intubated overnigth. Does not meet extubation criteria due to agitation   P:   Full vnent support duoneb Get duplex LE - surrogate to rule out PE (not a CTA candidate due to low GFR)  CARDIOVASCULAR CVL >  A:  Hypertensive emergency PAF AAA - minimal change from prior CT Syncope, unclear etiology   - not on pressors  P:  Cardizem gtt , bolus Target SBP < 150 mm/Hg NO anticoagulation Continuous cardiac monitoring May need CVL Trop Echo Carotid dopplers Tele   RENAL A:   AKI - baseline creat 1 ATN? Bladder cancer with mets - followed by Dr Risa Grill. S. Chemo and XRT   - has hydro on admit CT 03/20/2014: . D/w Dr Phebe Colla Urology on call - malignancy related obstruction. Creat rising. Dr Tresa Moore  recommends nephrostomy tube as opposed to stent  P:   Dr Tresa Moore will evalute at bedside for final recs Hydrate Follow BMP Pos balance goals  GASTROINTESTINAL A:  Bladder cancer with mets H/o GERD  P:   SUP: IV protonix NPO LFt follow up Start tube feeds  HEMATOLOGIC A:   Anemia, chronic Leukocytosis  P:  Follow CBC scd No heparin due to [facility]  INFECTIOUS A:   SIRS - no obvious source immunocompromised host , s/p chemo, rads months ago   - no fever  P:   BCx2 9/28 >>> Check UA UC 9/28 >>> Monitor off ABX Trend WBC and fever curve  ENDOCRINE A:   DM  P:   CBG monitoring SSI Check cortisol if drops BP or glucose  NEUROLOGIC A:   Acute traumatic SAH Syncope R/o ligamentous injury neck - CT neck negeative   - agitated on WUA off diprivan intermittently  P:   RASS goal: 0 Neurosurgery following Repeat CT head in AM Neuro checks See cvs sections for work up Keep collar as unable to clear clinically   FAMILY Family updates:   - 9/28 - Grandson (POA) is aware of current situation and severity. Agrees to no code blue, but would still want elective intubation if needed. No ETT during cardiac arrest   - 04/16/14: nephew and friend at bedside. They are not POA and not updated.    Interdisciplinary Family Meeting v Palliative Care Meeting:  Meets critieria for palliative care consult:  called 04/16/14     The patient is critically ill with multiple organ systems failure and requires high complexity decision making for assessment and support, frequent evaluation and titration of therapies, application of advanced monitoring technologies and extensive interpretation of multiple databases.   Critical Care Time devoted to patient care services described in this note is  35  Minutes.  Dr. Brand Males, M.D., [facility].C.P Pulmonary and Critical  Care Medicine Staff Physician [facility] Pager: [phone], If no  answer or between  15:00h - 7:00h: call [phone]  04/16/2014 9:58 AM

## 2014-04-16 NOTE — Progress Notes (Signed)
*  Preliminary Results* Bilateral lower extremity venous duplex completed. Bilateral lower extremities are negative for deep vein thrombosis. There is no evidence of Baker's cyst bilaterally.  04/16/2014  Maudry Mayhew, RVT, RDCS, RDMS

## 2014-04-16 NOTE — Progress Notes (Signed)
Patient ID: Patrick Lane, male   DOB: Oct 14, 1927, 80 y.o.   MRN: [id number] Subjective:  The patient was intubated last evening because of respiratory issues.  Objective: Vital signs in last 24 hours: Temp:  [97.5 F (36.4 C)-99.6 F (37.6 C)] 98.5 F (36.9 C) (09/29 0318) Pulse Rate:  [42-297] 88 (09/29 0600) Resp:  [16-34] 16 (09/29 0600) BP: (99-189)/(33-138) 127/60 mmHg (09/29 0600) SpO2:  [83 %-100 %] 97 % (09/29 0600) FiO2 (%):  [40 %-100 %] 40 % (09/29 0700) Weight:  [70.8 kg (156 lb 1.4 oz)-81.647 kg (180 lb)] 73.5 kg (162 lb 0.6 oz) (09/29 0318)  Intake/Output from previous day: 09/28 0701 - 09/29 0700 In: 1527.6 [I.V.:1427.6; IV Piggyback:100] Out: 315 [Urine:315] Intake/Output this shift:    Physical exam the patient is somnolent but easily arousable. Glasgow Coma Scale 10 intubated. He is moving all 4 extremities to command. His pupils are unchanged, i.e. his left pupil is miotic his right pupil is irregular  I reviewed the patient's followup head CT performed this morning. It demonstrates some slight progression of his left subarachnoid hemorrhage subdural hematoma with minimal mass effect.  Lab Results:  Recent Labs  04/08/2014 1408 04/16/14 0222  WBC 13.8* 10.4  HGB 10.4* 8.8*  HCT 31.9* 27.6*  PLT 365 316   BMET  Recent Labs  03/29/2014 1408 04/16/14 0222  NA 136* 138  K 4.4 4.6  CL 95* 98  CO2 25 27  GLUCOSE 199* 147*  BUN 24* 25*  CREATININE 1.66* 1.74*  CALCIUM 9.6 9.0    Studies/Results: Ct Head Wo Contrast  04/16/2014   CLINICAL DATA:  Follow up subarachnoid hemorrhage.  EXAM: CT HEAD WITHOUT CONTRAST  TECHNIQUE: Contiguous axial images were obtained from the base of the skull through the vertex without intravenous contrast.  COMPARISON:  04/01/2014  FINDINGS: Acute left subdural, subarachnoid, and intraparenchymal hemorrhages with fluid layering over the tentorium. Subdural hematoma measures about 7 mm depth and appears to be increasing  since the previous study. Left intraparenchymal hematoma is demonstrated in the frontal, parietal, and temporal regions without significant progression. There is increased subarachnoid hemorrhage in the sylvian fissure. Tentorial hemorrhage appears to be increasing as well. Mild mass effect with some sulcal effacement. Approximately 5 mm left-to-right midline shift. No effacement of basal cisterns.  IMPRESSION: Acute left subdural, subarachnoid, and intraparenchymal hemorrhage demonstrating some progression since previous study.   Electronically Signed   By: Lucienne Capers M.D.   On: 04/16/2014 04:47   Ct Head Wo Contrast  04/14/2014   CLINICAL DATA:  Trauma; altered mental status  EXAM: CT HEAD WITHOUT CONTRAST  CT CERVICAL SPINE WITHOUT CONTRAST  TECHNIQUE: Multidetector CT imaging of the head and cervical spine was performed following the standard protocol without intravenous contrast. Multiplanar CT image reconstructions of the cervical spine were also generated.  COMPARISON:  October 10, 2009  FINDINGS: CT HEAD FINDINGS  There is extensive subarachnoid hemorrhage throughout the left calvarium. There is some hemorrhage extending into the tentorium consistent with Stentor radial subdural hematoma. There is also a small subdural hematoma on the left in the temporal region with a maximum thickness of 5 mm. There is no appreciable midline shift.  There is underlying moderate generalized atrophy. There is mild diffuse atrophy with mild patchy small vessel disease in the centra semiovale bilaterally.  There is no demonstrable fracture ; the bony calvarium appears intact. The mastoid air cells are clear. There is rightward deviation of the nasal septum.  CT  CERVICAL SPINE FINDINGS  There is no appreciable fracture or spondylolisthesis. Prevertebral soft tissues and predental space regions are normal. There is mild disc space narrowing at C5-6 and C6-7. There is multifocal osteoarthritic change with exit foraminal  narrowing at multiple levels. These changes are most pronounced at C3-4 on the left, C4-5 on the left, and C5-6 on the right. No disc extrusion or stenosis. There is calcification in both carotid arteries.  IMPRESSION: CT head: Extensive subarachnoid hemorrhage throughout the left calvarium, most pronounced in the left temporal lobe with foci also present in the frontal and parietal lobes on the left. There is a small subdural hematoma on the left measuring 5 mm in maximal thickness. There is a small tentorial subdural hematoma on the left as well. No midline shift. Underlying atrophy with small vessel disease. A prior lacunar type infarct in the left medial temporal lobe currently contains a focus of hemorrhage.  CT cervical spine: Multifocal osteoarthritic change. No fracture or spondylolisthesis. Calcification in both carotid arteries.  Critical Value/emergent results were called by telephone at the time of interpretation on 04/07/2014 at 3:59 pm to Dr. Evelina Bucy , who verbally acknowledged these results.   Electronically Signed   By: Lowella Grip M.D.   On: 04/01/2014 16:00   Ct Cervical Spine Wo Contrast  03/31/2014   CLINICAL DATA:  Trauma; altered mental status  EXAM: CT HEAD WITHOUT CONTRAST  CT CERVICAL SPINE WITHOUT CONTRAST  TECHNIQUE: Multidetector CT imaging of the head and cervical spine was performed following the standard protocol without intravenous contrast. Multiplanar CT image reconstructions of the cervical spine were also generated.  COMPARISON:  October 10, 2009  FINDINGS: CT HEAD FINDINGS  There is extensive subarachnoid hemorrhage throughout the left calvarium. There is some hemorrhage extending into the tentorium consistent with Stentor radial subdural hematoma. There is also a small subdural hematoma on the left in the temporal region with a maximum thickness of 5 mm. There is no appreciable midline shift.  There is underlying moderate generalized atrophy. There is mild diffuse  atrophy with mild patchy small vessel disease in the centra semiovale bilaterally.  There is no demonstrable fracture ; the bony calvarium appears intact. The mastoid air cells are clear. There is rightward deviation of the nasal septum.  CT CERVICAL SPINE FINDINGS  There is no appreciable fracture or spondylolisthesis. Prevertebral soft tissues and predental space regions are normal. There is mild disc space narrowing at C5-6 and C6-7. There is multifocal osteoarthritic change with exit foraminal narrowing at multiple levels. These changes are most pronounced at C3-4 on the left, C4-5 on the left, and C5-6 on the right. No disc extrusion or stenosis. There is calcification in both carotid arteries.  IMPRESSION: CT head: Extensive subarachnoid hemorrhage throughout the left calvarium, most pronounced in the left temporal lobe with foci also present in the frontal and parietal lobes on the left. There is a small subdural hematoma on the left measuring 5 mm in maximal thickness. There is a small tentorial subdural hematoma on the left as well. No midline shift. Underlying atrophy with small vessel disease. A prior lacunar type infarct in the left medial temporal lobe currently contains a focus of hemorrhage.  CT cervical spine: Multifocal osteoarthritic change. No fracture or spondylolisthesis. Calcification in both carotid arteries.  Critical Value/emergent results were called by telephone at the time of interpretation on 03/20/2014 at 3:59 pm to Dr. Evelina Bucy , who verbally acknowledged these results.   Electronically Signed  By: Lowella Grip M.D.   On: 03/21/2014 16:00   Ct Abdomen Pelvis W Contrast  04/05/2014   CLINICAL DATA:  Fall, found down, abdominal pain, not responding to questions ; per chart history of COPD, type 2 diabetes, hyperlipidemia, abdominal aortic aneurysm, bladder cancer  EXAM: CT ABDOMEN AND PELVIS WITH CONTRAST  TECHNIQUE: Multidetector CT imaging of the abdomen and pelvis was  performed using the standard protocol following bolus administration of intravenous contrast. Sagittal and coronal MPR images reconstructed from axial data set.  CONTRAST:  81mL OMNIPAQUE IOHEXOL 300 MG/ML SOLN IV. No oral contrast administered.  COMPARISON:  01/21/2014  FINDINGS: Small LEFT pleural effusion.  Bibasilar atelectasis versus subtle infiltrate in LEFT lower lobe.  Atherosclerotic calcifications with aneurysmal dilatation of the infrarenal aorta 4.6 x 4.3 cm and 6.2 cm length image 38 previously 4.4 x 4.4 cm in axial dimensions.  Common iliac arteries measure 14 mm diameter RIGHT and 13 mm LEFT.  Liver, spleen, pancreas, LEFT adrenal gland and RIGHT kidney normal appearance.  New RIGHT adrenal mass 2.8 x 1.8 cm question metastasis.  LEFT para-aortic adenopathy identified on the previous study is now ill defined measuring in aggregate 2.7 x 2.4 cm with scattered associated infiltrative changes question related to interval treatment for bladder cancer?  The infiltration in LEFT periaortic space is not felt to be due to the aortic aneurysm.  LEFT hydronephrosis and proximal hydroureter with delay in LEFT nephrogram and mild diffuse perinephric edema.  LEFT ureteral dilatation terminates at the LEFT periaortic adenopathy.  Enhancing soft tissue tumor identified at the LEFT posterolateral urinary bladder compatible of bladder cancer, increased in size since previous exam, now measuring approximately 4.0 cm in greatest dimension and up to 1.5 cm thick.  Diffuse infiltration of prevesical fat may reflect tumor extension into the prevesical space.  10 mm short axis aortocaval lymph node image 32 previously 8 mm.  Enlarged LEFT pelvic sidewall lymph node 11 mm short axis image 68.  No additional mass, ascites, free air, or definite osseous metastatic lesion.  Small focus of nonspecific gas is seen in the retrocrural space, of uncertain etiology image 9.  IMPRESSION: Increased size of LEFT lateral bladder mass  with suspected extension into the prevesical space.  New LEFT adrenal mass consistent with metastasis.  New LEFT hydronephrosis and proximal hydroureter suspect related to ureteral obstruction secondary to inflammatory changes at previously identified LEFT para-aortic adenopathy, question undergoing chemotherapy? .  Enlarged retroperitoneal and LEFT pelvic sidewall lymph nodes.  Abdominal aortic aneurysm 4.3 x 4.6 x 6.2 cm little changed.  Small LEFT pleural effusion.   Electronically Signed   By: Lavonia Dana M.D.   On: 04/16/2014 16:20   Dg Pelvis Portable  03/22/2014   CLINICAL DATA:  Pain post trauma  EXAM: PORTABLE PELVIS 1-2 VIEWS  COMPARISON:  None.  FINDINGS: There is fairly mild symmetric narrowing of both hip joints. There is no demonstrable fracture or dislocation. No erosive change. There is arterial vascular calcification bilaterally. There is periarticular osteoporosis bilaterally.  IMPRESSION: Periarticular osteoporosis and osteoarthritic change in both hip joints. No acute fracture or dislocation apparent.   Electronically Signed   By: Lowella Grip M.D.   On: 04/13/2014 15:04   Dg Chest Port 1 View  03/25/2014   CLINICAL DATA:  Hypoxia  EXAM: PORTABLE CHEST - 1 VIEW  COMPARISON:  Study obtained earlier in the day  FINDINGS: Endotracheal tube tip is 3.1 cm above the carina. Nasogastric tube tip and side port  are in the stomach. There is no demonstrable pneumothorax. There is a small area of infiltrate in the right base. Elsewhere lungs are clear. Heart size and pulmonary vascularity are normal. No adenopathy. There is acromioclavicular separation on the left.  IMPRESSION: Tube positions as described without pneumothorax. Small area of infiltrate right base. Acromioclavicular separation on left.   Electronically Signed   By: Lowella Grip M.D.   On: 03/21/2014 21:37   Dg Chest Port 1 View  04/16/2014   CLINICAL DATA:  Status post fall today; history of COPD, tobacco use, diabetes, and  atrial fibrillation  EXAM: PORTABLE CHEST - 1 VIEW  COMPARISON:  None.  FINDINGS: The lungs are adequately inflated. There is mildly increased prominence of the right hilum as compared to the previous study. The cardiac silhouette is normal in size. The pulmonary vascularity is not engorged. There is no pleural effusion or pneumothorax. The trachea is midline. There is a mildly displaced acute fracture the posterior aspect of the right fifth rib. There may be deformities of the lateral aspects of the third and fourth ribs.  IMPRESSION: 1. There is an acute mildly displaced fracture of the posterior aspect of the right fifth rib. There may be fractures of the lateral aspects of the right third and fourth ribs. There is no pneumothorax or pleural effusion. 2. There is no evidence of CHF. Mild hilar prominence on the right is noted and is more conspicuous than in the past. When the patient can tolerate the procedure, a PA and lateral chest x-ray would be useful in an effort to reassess the hilar structures.   Electronically Signed   By: David  Martinique   On: 04/04/2014 15:04   Dg Abd Portable 1v  04/06/2014   CLINICAL DATA:  Enteric tube placement.  EXAM: PORTABLE ABDOMEN - 1 VIEW  COMPARISON:  03/22/2014.  FINDINGS: Enteric tube is present with the tip in the gastric fundus. Proximal side port in the proximal gastric fundus.  IMPRESSION: Nasogastric tube in the stomach.   Electronically Signed   By: Dereck Ligas M.D.   On: 04/05/2014 21:36    Assessment/Plan: Left subarachnoid hemorrhage, subdural hematoma: This remains small without significant mass effect. We will continue observation.  Respiratory failure, multiple other medical issues: Noted  LOS: 1 day     Kaytlin Burklow D 04/16/2014, 7:42 AM

## 2014-04-16 NOTE — Progress Notes (Signed)
  Echocardiogram 2D Echocardiogram has been performed.  Patrick Lane 04/16/2014, 12:22 PM

## 2014-04-16 NOTE — Progress Notes (Signed)
INITIAL NUTRITION ASSESSMENT  Pt meets criteria for SEVERE MALNUTRITION in the context of chronic illness as evidenced by 23% weight loss x 3 months and severe muscle depletion.  DOCUMENTATION CODES Per approved criteria  -Severe malnutrition in the context of chronic illness   INTERVENTION: Initiate Vital High Protein @ 20 ml/hr via OG tube and increase by 10 ml every 8 hours to goal rate of 60 ml/hr.   Tube feeding regimen provides 1440 kcal, 126 grams of protein, and 1203 ml of H2O.  Tube feeding and current Propofol provides: 1784 total calories (103% of needs)  Monitor magnesium, potassium, and phosphorus daily for at least 3 days, MD to replete as needed, as pt is at risk for refeeding syndrome given severe malnutrition (labs ordered).   NUTRITION DIAGNOSIS: Inadequate oral intake related to inability to eat as evidenced by NPO status  Goal: Pt to meet >/= 90% of their estimated nutrition needs   Monitor:  Goals of care, TF initiation and tolerance, weight trends, labs  Reason for Assessment: Consult received to initiate and manage enteral nutrition support.  80 y.o. male  Admitting Dx: SAH  ASSESSMENT: History includes bladder Ca (s/p chemo, radiation. Last therapy 2-3 months ago). Pt lives at home with live-in helper.  Pt admitted with acute traumatic SAH, intubated 9/29. Palliative care meeting planned for 9/30.  Grandson at bedside and reports that pt lost a lot of weight during chemo/XRT with usual weight of 210 lb.   Patient is currently intubated on ventilator support MV: 10.5 L/min Temp (24hrs), Avg:98.8 F (37.1 C), Min:97.5 F (36.4 C), Max:99.6 F (37.6 C)  Propofol: 12.6 ml/hr provides 332 kcal from lipid per day  Nutrition Focused Physical Exam:  Subcutaneous Fat:  Orbital Region: WDL Upper Arm Region: WDL Thoracic and Lumbar Region: WDL  Muscle:  Temple Region: severe depleiton Clavicle Bone Region: WDL Clavicle and Acromion Bone Region:  severe depletion Scapular Bone Region: NA Dorsal Hand: NA Patellar Region: severe depletion Anterior Thigh Region: severe depletion  Posterior Calf Region: severe depletion  Edema: not present   Height: Ht Readings from Last 1 Encounters:  03/21/2014 6\' 1"  (1.854 m)    Weight: Wt Readings from Last 1 Encounters:  04/16/14 162 lb 0.6 oz (73.5 kg)    Ideal Body Weight: 83.6 kg   % Ideal Body Weight: 88%  Wt Readings from Last 10 Encounters:  04/16/14 162 lb 0.6 oz (73.5 kg)  01/22/14 186 lb (84.369 kg)  10/19/13 187 lb 1.6 oz (84.868 kg)  10/11/13 183 lb (83.008 kg)  09/24/13 183 lb 10.3 oz (83.3 kg)  09/24/13 183 lb 10.3 oz (83.3 kg)  09/14/13 188 lb 12.8 oz (85.639 kg)  09/10/13 187 lb 3.2 oz (84.913 kg)  09/07/13 189 lb 6.4 oz (85.911 kg)  09/05/13 191 lb 3.2 oz (86.728 kg)    Usual Body Weight: 210 lb   % Usual Body Weight: 77%  BMI:  Body mass index is 21.38 kg/(m^2).  Estimated Nutritional Needs: Kcal: 4098 Protein: > 110 grams  Fluid: > 1.7 L/day  Skin: intact  Diet Order: NPO  EDUCATION NEEDS: -No education needs identified at this time   Intake/Output Summary (Last 24 hours) at 04/16/14 1330 Last data filed at 04/16/14 1200  Gross per 24 hour  Intake 2077.29 ml  Output    590 ml  Net 1487.29 ml    Last BM: 9/29   Labs:   Recent Labs Lab 04/10/2014 1408 04/16/14 0222  NA 136* 138  K 4.4 4.6  CL 95* 98  CO2 25 27  BUN 24* 25*  CREATININE 1.66* 1.74*  CALCIUM 9.6 9.0  MG  --  1.7  PHOS  --  3.7  GLUCOSE 199* 147*    CBG (last 3)   Recent Labs  04/16/14 0317 04/16/14 0747 04/16/14 1117  GLUCAP 134* 141* 126*    Scheduled Meds: . antiseptic oral rinse  7 mL Mouth Rinse QID  . chlorhexidine  15 mL Mouth Rinse BID  . diltiazem  30 mg Per Tube 4 times per day  . insulin aspart  0-9 Units Subcutaneous 6 times per day  . ipratropium-albuterol  3 mL Nebulization Q4H  . pantoprazole (PROTONIX) IV  40 mg Intravenous QHS  .  piperacillin-tazobactam (ZOSYN)  IV  3.375 g Intravenous 3 times per day    Continuous Infusions: . sodium chloride    . sodium chloride 100 mL/hr at 04/16/14 0700  . propofol 20 mcg/kg/min (04/16/14 1100)    Past Medical History  Diagnosis Date  . COPD (chronic obstructive pulmonary disease)   . Hyperlipidemia   . Type 2 diabetes mellitus   . Adult bronchiectasis   . Meatal stenosis   . History of atrial fibrillation without current medication     POST OP EPISODE 2004   . AAA (abdominal aortic aneurysm)     LAST DUPLEX ULTRASOUND 06-22-2010  3.7x3.6  . Productive cough   . Dyspnea on exertion   . GERD (gastroesophageal reflux disease)   . History of gastric ulcer   . Foot ulcer, left     BOTTOM OF LITTLE TOE , MONITORED BY PCP  DSG DAILY W/ ANTIBIOTIC OINTMENT AND EPSOM SALT SOAKS  . Arthritis   . Bursitis of shoulder     BOTH  . Frequency of urination   . Urgency of urination   . Nocturia   . Wears glasses   . Bladder cancer     DX  OCT 2014 W/ INVASIVE HIGH GRADE UROTHELIAL BLADDER CARCINOMA (ONCOLOGIST--  DR Alen Blew)    Past Surgical History  Procedure Laterality Date  . Total knee arthroplasty Right 12-05-2002  . Excision right saphenous neuroma/ right knee arthroscopic lysis adhesions and manipulation  05-29-2003  . Shoulder arthroscopy with rotator cuff repair and subacromial decompression Left 03-02-2005    RESECTION OPEN DISTAL CLAVICLE   . Orif compound right femur fx  1970'S    AND RIGHT PATELLECTOMY /  HARDWARE REMOVED LATER  . Transthoracic echocardiogram  12-07-2002    NORMAL LV/ EF 55-65%  . Appendectomy  AGE 18  . Cataract extraction w/ intraocular lens  implant, bilateral    . Retinal detachment surgery Right 1980'S  . Cystoscopy with urethral dilatation N/A 05/14/2013    Procedure: CYSTOSCOPY WITH MEATAL DILATION ;  Surgeon: Bernestine Amass, MD;  Location: [facility];  Service: Urology;  Laterality: N/A;  . Circumcision N/A  05/14/2013    Procedure: CIRCUMCISION ADULT;  Surgeon: Bernestine Amass, MD;  Location: [facility];  Service: Urology;  Laterality: N/A;  . Transurethral resection of bladder tumor N/A 05/14/2013    Procedure: TRANSURETHRAL RESECTION OF BLADDER TUMOR (TURBT);  Surgeon: Bernestine Amass, MD;  Location: [facility];  Service: Urology;  Laterality: N/A;  . Transurethral resection of bladder tumor N/A 06/25/2013    Procedure: REPEAT TRANSURETHRAL RESECTION OF BLADDER TUMOR (TURBT);  Surgeon: Bernestine Amass, MD;  Location: [facility];  Service: Urology;  Laterality:  N/A;  . Esophagogastroduodenoscopy Left 09/26/2013    Procedure: ESOPHAGOGASTRODUODENOSCOPY (EGD);  Surgeon: Arta Silence, MD;  Location: [facility];  Service: Endoscopy;  Laterality: Left;  . Colonoscopy N/A 09/26/2013    Procedure: COLONOSCOPY;  Surgeon: Arta Silence, MD;  Location: [facility];  Service: Endoscopy;  Laterality: N/A;   Maylon Peppers RD, [place], [place] Pager [phone] After Hours Pager

## 2014-04-16 NOTE — Consult Note (Signed)
Reason for Consult: Worsening Left Hydronephrosis, Acute Renal Failure, Metastatic Bladder Cancer  Referring Physician:  Merrie Roof MD  Patrick Lane is an 80 y.o. male.   HPI:   1 - Metastatic Bladder Cancer - History of high-grade muscle invasive bladder cancer since 04/2013 s/p TURBT x several and chemo-radiation as not cystectomy candidate. New pelvic lymphadenopahty and right adrenal mass by CT worrisome for progressive disease this admission.   2 - Left Hydronephrosis - known left bladder wall primary bladder cancer near left ureteral orifice. CT this admission with worsening left hydro to level of enlarged left wall bladder mass  3 -  Acute Renal Failure - Cr 1.7 this admission form baseline <1, left hydro as per above. Family admits to decline in PO intake past few weeks.   Today Patrick Lane is seen in consultation for above. He normally follows with Dr. Risa Grill in our practice. Unfortunately he is critically ill with acute subarrachnoid hemorrage and intubated.   Past Medical History  Diagnosis Date  . COPD (chronic obstructive pulmonary disease)   . Hyperlipidemia   . Type 2 diabetes mellitus   . Adult bronchiectasis   . Meatal stenosis   . History of atrial fibrillation without current medication     POST OP EPISODE 2004   . AAA (abdominal aortic aneurysm)     LAST DUPLEX ULTRASOUND 06-22-2010  3.7x3.6  . Productive cough   . Dyspnea on exertion   . GERD (gastroesophageal reflux disease)   . History of gastric ulcer   . Foot ulcer, left     BOTTOM OF LITTLE TOE , MONITORED BY PCP  DSG DAILY W/ ANTIBIOTIC OINTMENT AND EPSOM SALT SOAKS  . Arthritis   . Bursitis of shoulder     BOTH  . Frequency of urination   . Urgency of urination   . Nocturia   . Wears glasses   . Bladder cancer     DX  OCT 2014 W/ INVASIVE HIGH GRADE UROTHELIAL BLADDER CARCINOMA (ONCOLOGIST--  DR Alen Blew)    Past Surgical History  Procedure Laterality Date  . Total knee arthroplasty  Right 12-05-2002  . Excision right saphenous neuroma/ right knee arthroscopic lysis adhesions and manipulation  05-29-2003  . Shoulder arthroscopy with rotator cuff repair and subacromial decompression Left 03-02-2005    RESECTION OPEN DISTAL CLAVICLE   . Orif compound right femur fx  1970'S    AND RIGHT PATELLECTOMY /  HARDWARE REMOVED LATER  . Transthoracic echocardiogram  12-07-2002    NORMAL LV/ EF 55-65%  . Appendectomy  AGE 46  . Cataract extraction w/ intraocular lens  implant, bilateral    . Retinal detachment surgery Right 1980'S  . Cystoscopy with urethral dilatation N/A 05/14/2013    Procedure: CYSTOSCOPY WITH MEATAL DILATION ;  Surgeon: Bernestine Amass, MD;  Location: [facility];  Service: Urology;  Laterality: N/A;  . Circumcision N/A 05/14/2013    Procedure: CIRCUMCISION ADULT;  Surgeon: Bernestine Amass, MD;  Location: [facility];  Service: Urology;  Laterality: N/A;  . Transurethral resection of bladder tumor N/A 05/14/2013    Procedure: TRANSURETHRAL RESECTION OF BLADDER TUMOR (TURBT);  Surgeon: Bernestine Amass, MD;  Location: [facility];  Service: Urology;  Laterality: N/A;  . Transurethral resection of bladder tumor N/A 06/25/2013    Procedure: REPEAT TRANSURETHRAL RESECTION OF BLADDER TUMOR (TURBT);  Surgeon: Bernestine Amass, MD;  Location: [facility];  Service: Urology;  Laterality: N/A;  .  Esophagogastroduodenoscopy Left 09/26/2013    Procedure: ESOPHAGOGASTRODUODENOSCOPY (EGD);  Surgeon: Arta Silence, MD;  Location: [facility];  Service: Endoscopy;  Laterality: Left;  . Colonoscopy N/A 09/26/2013    Procedure: COLONOSCOPY;  Surgeon: Arta Silence, MD;  Location: [facility];  Service: Endoscopy;  Laterality: N/A;    Family History  Problem Relation Age of Onset  . Heart disease Mother   . Cancer Mother     and 3 sisters and 2 brothers but unsure of type.    Social History:  reports that he quit  smoking about 14 years ago. His smoking use included Cigarettes. He has a 105 pack-year smoking history. He has quit using smokeless tobacco. His smokeless tobacco use included Chew. He reports that he does not drink alcohol or use illicit drugs.  Allergies:  Allergies  Allergen Reactions  . Celebrex [Celecoxib] Hives    Medications: I have reviewed the patient's current medications.  Results for orders placed during the hospital encounter of 03/22/2014 (from the past 48 hour(s))  CBC     Status: Abnormal   Collection Time    03/27/2014  2:08 PM      Result Value Ref Range   WBC 13.8 (*) 4.0 - 10.5 K/uL   RBC 3.49 (*) 4.22 - 5.81 MIL/uL   Hemoglobin 10.4 (*) 13.0 - 17.0 g/dL   HCT 31.9 (*) 39.0 - 52.0 %   MCV 91.4  78.0 - 100.0 fL   MCH 29.8  26.0 - 34.0 pg   MCHC 32.6  30.0 - 36.0 g/dL   RDW 15.4  11.5 - 15.5 %   Platelets 365  150 - 400 K/uL  BASIC METABOLIC PANEL     Status: Abnormal   Collection Time    03/19/2014  2:08 PM      Result Value Ref Range   Sodium 136 (*) 137 - 147 mEq/L   Potassium 4.4  3.7 - 5.3 mEq/L   Chloride 95 (*) 96 - 112 mEq/L   CO2 25  19 - 32 mEq/L   Glucose, Bld 199 (*) 70 - 99 mg/dL   BUN 24 (*) 6 - 23 mg/dL   Creatinine, Ser 1.66 (*) 0.50 - 1.35 mg/dL   Calcium 9.6  8.4 - 10.5 mg/dL   GFR calc non Af Amer 36 (*) >90 mL/min   GFR calc Af Amer 42 (*) >90 mL/min   Comment: (NOTE)     The eGFR has been calculated using the CKD EPI equation.     This calculation has not been validated in all clinical situations.     eGFR's persistently <90 mL/min signify possible Chronic Kidney     Disease.   Anion gap 16 (*) 5 - 15  PROTIME-INR     Status: None   Collection Time    04/17/2014  2:08 PM      Result Value Ref Range   Prothrombin Time 15.1  11.6 - 15.2 seconds   INR 1.19  0.00 - 1.49  CK     Status: None   Collection Time    04/09/2014  2:08 PM      Result Value Ref Range   Total CK 146  7 - 232 U/L  TROPONIN I     Status: None   Collection Time     03/25/2014  7:31 PM      Result Value Ref Range   Troponin I <0.30  <0.30 ng/mL   Comment:  Due to the release kinetics of cTnI,     a negative result within the first hours     of the onset of symptoms does not rule out     myocardial infarction with certainty.     If myocardial infarction is still suspected,     repeat the test at appropriate intervals.  MRSA PCR SCREENING     Status: None   Collection Time    04/16/2014  8:14 PM      Result Value Ref Range   MRSA by PCR NEGATIVE  NEGATIVE   Comment:            The GeneXpert MRSA Assay (FDA     approved for NASAL specimens     only), is one component of a     comprehensive MRSA colonization     surveillance program. It is not     intended to diagnose MRSA     infection nor to guide or     monitor treatment for     MRSA infections.  TRIGLYCERIDES     Status: None   Collection Time    04/03/2014  8:37 PM      Result Value Ref Range   Triglycerides 137  <150 mg/dL  CULTURE, RESPIRATORY (NON-EXPECTORATED)     Status: None   Collection Time    04/11/2014 10:11 PM      Result Value Ref Range   Specimen Description TRACHEAL ASPIRATE     Special Requests NONE     Gram Stain       Value: FEW WBC PRESENT, PREDOMINANTLY MONONUCLEAR     NO SQUAMOUS EPITHELIAL CELLS SEEN     RARE GRAM NEGATIVE RODS     RARE YEAST     Performed at [facility]   Culture PENDING     Report Status PENDING    GLUCOSE, CAPILLARY     Status: Abnormal   Collection Time    03/31/2014 10:12 PM      Result Value Ref Range   Glucose-Capillary 186 (*) 70 - 99 mg/dL  BLOOD GAS, ARTERIAL     Status: Abnormal   Collection Time    04/16/2014 10:18 PM      Result Value Ref Range   FIO2 100.00     Delivery systems VENTILATOR     Mode PRESSURE REGULATED VOLUME CONTROL     VT 640     Rate 16     Peep/cpap 5.0     pH, Arterial 7.380  7.350 - 7.450   pCO2 arterial 43.6  35.0 - 45.0 mmHg   pO2, Arterial 382.0 (*) 80.0 - 100.0 mmHg   Bicarbonate 25.2  (*) 20.0 - 24.0 mEq/L   TCO2 26.5  0 - 100 mmol/L   Acid-Base Excess 0.7  0.0 - 2.0 mmol/L   O2 Saturation 99.8     Patient temperature 98.6     Collection site RIGHT RADIAL     Drawn by [id number]     Sample type ARTERIAL DRAW     Allens test (pass/fail) PASS  PASS  GLUCOSE, CAPILLARY     Status: Abnormal   Collection Time    04/03/2014 11:39 PM      Result Value Ref Range   Glucose-Capillary 162 (*) 70 - 99 mg/dL   Comment 1 Documented in Chart     Comment 2 Notify RN    CBC     Status: Abnormal   Collection Time    04/16/14  2:22 AM      Result Value Ref Range   WBC 10.4  4.0 - 10.5 K/uL   RBC 3.04 (*) 4.22 - 5.81 MIL/uL   Hemoglobin 8.8 (*) 13.0 - 17.0 g/dL   HCT 27.6 (*) 39.0 - 52.0 %   MCV 90.8  78.0 - 100.0 fL   MCH 28.9  26.0 - 34.0 pg   MCHC 31.9  30.0 - 36.0 g/dL   RDW 15.5  11.5 - 15.5 %   Platelets 316  150 - 400 K/uL  BASIC METABOLIC PANEL     Status: Abnormal   Collection Time    04/16/14  2:22 AM      Result Value Ref Range   Sodium 138  137 - 147 mEq/L   Potassium 4.6  3.7 - 5.3 mEq/L   Chloride 98  96 - 112 mEq/L   CO2 27  19 - 32 mEq/L   Glucose, Bld 147 (*) 70 - 99 mg/dL   BUN 25 (*) 6 - 23 mg/dL   Creatinine, Ser 1.74 (*) 0.50 - 1.35 mg/dL   Calcium 9.0  8.4 - 10.5 mg/dL   GFR calc non Af Amer 34 (*) >90 mL/min   GFR calc Af Amer 40 (*) >90 mL/min   Comment: (NOTE)     The eGFR has been calculated using the CKD EPI equation.     This calculation has not been validated in all clinical situations.     eGFR's persistently <90 mL/min signify possible Chronic Kidney     Disease.   Anion gap 13  5 - 15  MAGNESIUM     Status: None   Collection Time    04/16/14  2:22 AM      Result Value Ref Range   Magnesium 1.7  1.5 - 2.5 mg/dL  PHOSPHORUS     Status: None   Collection Time    04/16/14  2:22 AM      Result Value Ref Range   Phosphorus 3.7  2.3 - 4.6 mg/dL  GLUCOSE, CAPILLARY     Status: Abnormal   Collection Time    04/16/14  3:17 AM      Result  Value Ref Range   Glucose-Capillary 134 (*) 70 - 99 mg/dL   Comment 1 Documented in Chart     Comment 2 Notify RN    BLOOD GAS, ARTERIAL     Status: Abnormal   Collection Time    04/16/14  5:00 AM      Result Value Ref Range   FIO2 0.40     Delivery systems VENTILATOR     Mode PRESSURE REGULATED VOLUME CONTROL     VT 640     Rate 16     Peep/cpap 5.0     pH, Arterial 7.422  7.350 - 7.450   pCO2 arterial 39.1  35.0 - 45.0 mmHg   pO2, Arterial 65.3 (*) 80.0 - 100.0 mmHg   Bicarbonate 25.0 (*) 20.0 - 24.0 mEq/L   TCO2 26.2  0 - 100 mmol/L   Acid-Base Excess 1.1  0.0 - 2.0 mmol/L   O2 Saturation 93.5     Patient temperature 98.6     Collection site RIGHT RADIAL     Drawn by [id number]     Sample type ARTERIAL     Allens test (pass/fail) PASS  PASS  GLUCOSE, CAPILLARY     Status: Abnormal   Collection Time    04/16/14  7:47 AM  Result Value Ref Range   Glucose-Capillary 141 (*) 70 - 99 mg/dL   Comment 1 Notify RN     Comment 2 Documented in Chart    GLUCOSE, CAPILLARY     Status: Abnormal   Collection Time    04/16/14 11:17 AM      Result Value Ref Range   Glucose-Capillary 126 (*) 70 - 99 mg/dL   Comment 1 Notify RN     Comment 2 Documented in Chart      Ct Head Wo Contrast  04/16/2014   CLINICAL DATA:  Follow up subarachnoid hemorrhage.  EXAM: CT HEAD WITHOUT CONTRAST  TECHNIQUE: Contiguous axial images were obtained from the base of the skull through the vertex without intravenous contrast.  COMPARISON:  04/05/2014  FINDINGS: Acute left subdural, subarachnoid, and intraparenchymal hemorrhages with fluid layering over the tentorium. Subdural hematoma measures about 7 mm depth and appears to be increasing since the previous study. Left intraparenchymal hematoma is demonstrated in the frontal, parietal, and temporal regions without significant progression. There is increased subarachnoid hemorrhage in the sylvian fissure. Tentorial hemorrhage appears to be increasing as well.  Mild mass effect with some sulcal effacement. Approximately 5 mm left-to-right midline shift. No effacement of basal cisterns.  IMPRESSION: Acute left subdural, subarachnoid, and intraparenchymal hemorrhage demonstrating some progression since previous study.   Electronically Signed   By: Lucienne Capers M.D.   On: 04/16/2014 04:47   Ct Head Wo Contrast  04/07/2014   CLINICAL DATA:  Trauma; altered mental status  EXAM: CT HEAD WITHOUT CONTRAST  CT CERVICAL SPINE WITHOUT CONTRAST  TECHNIQUE: Multidetector CT imaging of the head and cervical spine was performed following the standard protocol without intravenous contrast. Multiplanar CT image reconstructions of the cervical spine were also generated.  COMPARISON:  October 10, 2009  FINDINGS: CT HEAD FINDINGS  There is extensive subarachnoid hemorrhage throughout the left calvarium. There is some hemorrhage extending into the tentorium consistent with Stentor radial subdural hematoma. There is also a small subdural hematoma on the left in the temporal region with a maximum thickness of 5 mm. There is no appreciable midline shift.  There is underlying moderate generalized atrophy. There is mild diffuse atrophy with mild patchy small vessel disease in the centra semiovale bilaterally.  There is no demonstrable fracture ; the bony calvarium appears intact. The mastoid air cells are clear. There is rightward deviation of the nasal septum.  CT CERVICAL SPINE FINDINGS  There is no appreciable fracture or spondylolisthesis. Prevertebral soft tissues and predental space regions are normal. There is mild disc space narrowing at C5-6 and C6-7. There is multifocal osteoarthritic change with exit foraminal narrowing at multiple levels. These changes are most pronounced at C3-4 on the left, C4-5 on the left, and C5-6 on the right. No disc extrusion or stenosis. There is calcification in both carotid arteries.  IMPRESSION: CT head: Extensive subarachnoid hemorrhage throughout the  left calvarium, most pronounced in the left temporal lobe with foci also present in the frontal and parietal lobes on the left. There is a small subdural hematoma on the left measuring 5 mm in maximal thickness. There is a small tentorial subdural hematoma on the left as well. No midline shift. Underlying atrophy with small vessel disease. A prior lacunar type infarct in the left medial temporal lobe currently contains a focus of hemorrhage.  CT cervical spine: Multifocal osteoarthritic change. No fracture or spondylolisthesis. Calcification in both carotid arteries.  Critical Value/emergent results were called by telephone at  the time of interpretation on 04/11/2014 at 3:59 pm to Dr. Evelina Bucy , who verbally acknowledged these results.   Electronically Signed   By: Lowella Grip M.D.   On: 03/19/2014 16:00   Ct Cervical Spine Wo Contrast  04/17/2014   CLINICAL DATA:  Trauma; altered mental status  EXAM: CT HEAD WITHOUT CONTRAST  CT CERVICAL SPINE WITHOUT CONTRAST  TECHNIQUE: Multidetector CT imaging of the head and cervical spine was performed following the standard protocol without intravenous contrast. Multiplanar CT image reconstructions of the cervical spine were also generated.  COMPARISON:  October 10, 2009  FINDINGS: CT HEAD FINDINGS  There is extensive subarachnoid hemorrhage throughout the left calvarium. There is some hemorrhage extending into the tentorium consistent with Stentor radial subdural hematoma. There is also a small subdural hematoma on the left in the temporal region with a maximum thickness of 5 mm. There is no appreciable midline shift.  There is underlying moderate generalized atrophy. There is mild diffuse atrophy with mild patchy small vessel disease in the centra semiovale bilaterally.  There is no demonstrable fracture ; the bony calvarium appears intact. The mastoid air cells are clear. There is rightward deviation of the nasal septum.  CT CERVICAL SPINE FINDINGS  There is no  appreciable fracture or spondylolisthesis. Prevertebral soft tissues and predental space regions are normal. There is mild disc space narrowing at C5-6 and C6-7. There is multifocal osteoarthritic change with exit foraminal narrowing at multiple levels. These changes are most pronounced at C3-4 on the left, C4-5 on the left, and C5-6 on the right. No disc extrusion or stenosis. There is calcification in both carotid arteries.  IMPRESSION: CT head: Extensive subarachnoid hemorrhage throughout the left calvarium, most pronounced in the left temporal lobe with foci also present in the frontal and parietal lobes on the left. There is a small subdural hematoma on the left measuring 5 mm in maximal thickness. There is a small tentorial subdural hematoma on the left as well. No midline shift. Underlying atrophy with small vessel disease. A prior lacunar type infarct in the left medial temporal lobe currently contains a focus of hemorrhage.  CT cervical spine: Multifocal osteoarthritic change. No fracture or spondylolisthesis. Calcification in both carotid arteries.  Critical Value/emergent results were called by telephone at the time of interpretation on 04/01/2014 at 3:59 pm to Dr. Evelina Bucy , who verbally acknowledged these results.   Electronically Signed   By: Lowella Grip M.D.   On: 03/22/2014 16:00   Ct Abdomen Pelvis W Contrast  04/01/2014   CLINICAL DATA:  Fall, found down, abdominal pain, not responding to questions ; per chart history of COPD, type 2 diabetes, hyperlipidemia, abdominal aortic aneurysm, bladder cancer  EXAM: CT ABDOMEN AND PELVIS WITH CONTRAST  TECHNIQUE: Multidetector CT imaging of the abdomen and pelvis was performed using the standard protocol following bolus administration of intravenous contrast. Sagittal and coronal MPR images reconstructed from axial data set.  CONTRAST:  49m OMNIPAQUE IOHEXOL 300 MG/ML SOLN IV. No oral contrast administered.  COMPARISON:  01/21/2014  FINDINGS:  Small LEFT pleural effusion.  Bibasilar atelectasis versus subtle infiltrate in LEFT lower lobe.  Atherosclerotic calcifications with aneurysmal dilatation of the infrarenal aorta 4.6 x 4.3 cm and 6.2 cm length image 38 previously 4.4 x 4.4 cm in axial dimensions.  Common iliac arteries measure 14 mm diameter RIGHT and 13 mm LEFT.  Liver, spleen, pancreas, LEFT adrenal gland and RIGHT kidney normal appearance.  New RIGHT adrenal mass 2.8 x  1.8 cm question metastasis.  LEFT para-aortic adenopathy identified on the previous study is now ill defined measuring in aggregate 2.7 x 2.4 cm with scattered associated infiltrative changes question related to interval treatment for bladder cancer?  The infiltration in LEFT periaortic space is not felt to be due to the aortic aneurysm.  LEFT hydronephrosis and proximal hydroureter with delay in LEFT nephrogram and mild diffuse perinephric edema.  LEFT ureteral dilatation terminates at the LEFT periaortic adenopathy.  Enhancing soft tissue tumor identified at the LEFT posterolateral urinary bladder compatible of bladder cancer, increased in size since previous exam, now measuring approximately 4.0 cm in greatest dimension and up to 1.5 cm thick.  Diffuse infiltration of prevesical fat may reflect tumor extension into the prevesical space.  10 mm short axis aortocaval lymph node image 32 previously 8 mm.  Enlarged LEFT pelvic sidewall lymph node 11 mm short axis image 68.  No additional mass, ascites, free air, or definite osseous metastatic lesion.  Small focus of nonspecific gas is seen in the retrocrural space, of uncertain etiology image 9.  IMPRESSION: Increased size of LEFT lateral bladder mass with suspected extension into the prevesical space.  New LEFT adrenal mass consistent with metastasis.  New LEFT hydronephrosis and proximal hydroureter suspect related to ureteral obstruction secondary to inflammatory changes at previously identified LEFT para-aortic adenopathy,  question undergoing chemotherapy? .  Enlarged retroperitoneal and LEFT pelvic sidewall lymph nodes.  Abdominal aortic aneurysm 4.3 x 4.6 x 6.2 cm little changed.  Small LEFT pleural effusion.   Electronically Signed   By: Lavonia Dana M.D.   On: 04/16/2014 16:20   Dg Pelvis Portable  03/23/2014   CLINICAL DATA:  Pain post trauma  EXAM: PORTABLE PELVIS 1-2 VIEWS  COMPARISON:  None.  FINDINGS: There is fairly mild symmetric narrowing of both hip joints. There is no demonstrable fracture or dislocation. No erosive change. There is arterial vascular calcification bilaterally. There is periarticular osteoporosis bilaterally.  IMPRESSION: Periarticular osteoporosis and osteoarthritic change in both hip joints. No acute fracture or dislocation apparent.   Electronically Signed   By: Lowella Grip M.D.   On: 04/17/2014 15:04   Dg Chest Port 1 View  04/07/2014   CLINICAL DATA:  Hypoxia  EXAM: PORTABLE CHEST - 1 VIEW  COMPARISON:  Study obtained earlier in the day  FINDINGS: Endotracheal tube tip is 3.1 cm above the carina. Nasogastric tube tip and side port are in the stomach. There is no demonstrable pneumothorax. There is a small area of infiltrate in the right base. Elsewhere lungs are clear. Heart size and pulmonary vascularity are normal. No adenopathy. There is acromioclavicular separation on the left.  IMPRESSION: Tube positions as described without pneumothorax. Small area of infiltrate right base. Acromioclavicular separation on left.   Electronically Signed   By: Lowella Grip M.D.   On: 04/09/2014 21:37   Dg Chest Port 1 View  04/03/2014   CLINICAL DATA:  Status post fall today; history of COPD, tobacco use, diabetes, and atrial fibrillation  EXAM: PORTABLE CHEST - 1 VIEW  COMPARISON:  None.  FINDINGS: The lungs are adequately inflated. There is mildly increased prominence of the right hilum as compared to the previous study. The cardiac silhouette is normal in size. The pulmonary vascularity is  not engorged. There is no pleural effusion or pneumothorax. The trachea is midline. There is a mildly displaced acute fracture the posterior aspect of the right fifth rib. There may be deformities of the lateral aspects of  the third and fourth ribs.  IMPRESSION: 1. There is an acute mildly displaced fracture of the posterior aspect of the right fifth rib. There may be fractures of the lateral aspects of the right third and fourth ribs. There is no pneumothorax or pleural effusion. 2. There is no evidence of CHF. Mild hilar prominence on the right is noted and is more conspicuous than in the past. When the patient can tolerate the procedure, a PA and lateral chest x-ray would be useful in an effort to reassess the hilar structures.   Electronically Signed   By: David  Martinique   On: 04/07/2014 15:04   Dg Abd Portable 1v  03/22/2014   CLINICAL DATA:  Enteric tube placement.  EXAM: PORTABLE ABDOMEN - 1 VIEW  COMPARISON:  03/25/2014.  FINDINGS: Enteric tube is present with the tip in the gastric fundus. Proximal side port in the proximal gastric fundus.  IMPRESSION: Nasogastric tube in the stomach.   Electronically Signed   By: Dereck Ligas M.D.   On: 04/11/2014 21:36    Review of Systems  Unable to perform ROS: intubated   Blood pressure 126/53, pulse 102, temperature 99.6 F (37.6 C), temperature source Axillary, resp. rate 16, height _0  (1.854 m), weight 73.5 kg (162 lb 0.6 oz), SpO2 99.00%. Physical Exam  Constitutional:  Elderly, thin, intubated, GCS 3T  HENT:  Head: Normocephalic.  Neck: Normal range of motion.  Cardiovascular:  Tachycardic to 103 by bedside monitor  GI: Soft. Bowel sounds are normal.  Genitourinary: Penis normal.  Foley c/d/i with clear yellow urine  Skin: Skin is warm.    Assessment/Plan:  1 - Metastatic Bladder Cancer - rapid progression of disease since last imaging study 2 mos ago.  Not candidate for further cancer-directed therapy.  Discussed natural history  with son in law who is Therapist, sports at [employer]. They are having family meeting tomorrow to discuss goals of further care with palliative care which is certainly appropriate.   2 - Left Hydronephrosis - likely from local tumor progression. Discussed role of nephrostomy should family desire maximally aggressive path including need for periodic changes and external appliance.   3 -  Acute Renal Failure - Multifactorial with some pre/post renal etiology from worsening PO intake before admission as well as some left renal obstruction as per above.   I will make Dr. Risa Grill aware of admission. Please call with questions.   Anye Patrick Lane 04/16/2014, 5:51 PM

## 2014-04-17 ENCOUNTER — Inpatient Hospital Stay (HOSPITAL_COMMUNITY): Payer: Medicare HMO

## 2014-04-17 DIAGNOSIS — N289 Disorder of kidney and ureter, unspecified: Secondary | ICD-10-CM

## 2014-04-17 DIAGNOSIS — C672 Malignant neoplasm of lateral wall of bladder: Secondary | ICD-10-CM

## 2014-04-17 DIAGNOSIS — Z7189 Other specified counseling: Secondary | ICD-10-CM

## 2014-04-17 DIAGNOSIS — Z515 Encounter for palliative care: Secondary | ICD-10-CM

## 2014-04-17 LAB — GLUCOSE, CAPILLARY
GLUCOSE-CAPILLARY: 160 mg/dL — AB (ref 70–99)
Glucose-Capillary: 191 mg/dL — ABNORMAL HIGH (ref 70–99)
Glucose-Capillary: 159 mg/dL — ABNORMAL HIGH (ref 70–99)
Glucose-Capillary: 185 mg/dL — ABNORMAL HIGH (ref 70–99)
Glucose-Capillary: 171 mg/dL — ABNORMAL HIGH (ref 70–99)
Glucose-Capillary: 135 mg/dL — ABNORMAL HIGH (ref 70–99)

## 2014-04-17 LAB — BASIC METABOLIC PANEL
Anion gap: 16 — ABNORMAL HIGH (ref 5–15)
BUN: 27 mg/dL — AB (ref 6–23)
CALCIUM: 8.8 mg/dL (ref 8.4–10.5)
CO2: 22 mEq/L (ref 19–32)
CREATININE: 1.68 mg/dL — AB (ref 0.50–1.35)
Chloride: 99 mEq/L (ref 96–112)
GFR calc non Af Amer: 36 mL/min — ABNORMAL LOW (ref >90)
GFR, EST AFRICAN AMERICAN: 41 mL/min — AB (ref >90)
Glucose, Bld: 203 mg/dL — ABNORMAL HIGH (ref 70–99)
Potassium: 3.9 mEq/L (ref 3.7–5.3)
Sodium: 137 mEq/L (ref 137–147)

## 2014-04-17 LAB — CBC WITH DIFFERENTIAL/PLATELET
Basophils Absolute: 0 10*3/uL (ref 0.0–0.1)
Basophils Relative: 0 % (ref 0–1)
EOS ABS: 0 10*3/uL (ref 0.0–0.7)
EOS PCT: 1 % (ref 0–5)
HEMATOCRIT: 24.6 % — AB (ref 39.0–52.0)
Hemoglobin: 7.8 g/dL — ABNORMAL LOW (ref 13.0–17.0)
LYMPHS ABS: 0.4 10*3/uL — AB (ref 0.7–4.0)
Lymphocytes Relative: 4 % — ABNORMAL LOW (ref 12–46)
MCH: 29 pg (ref 26.0–34.0)
MCHC: 31.7 g/dL (ref 30.0–36.0)
MCV: 91.4 fL (ref 78.0–100.0)
MONO ABS: 0.5 10*3/uL (ref 0.1–1.0)
Monocytes Relative: 6 % (ref 3–12)
Neutro Abs: 7.6 10*3/uL (ref 1.7–7.7)
Neutrophils Relative %: 89 % — ABNORMAL HIGH (ref 43–77)
PLATELETS: 261 10*3/uL (ref 150–400)
RBC: 2.69 MIL/uL — AB (ref 4.22–5.81)
RDW: 16 % — ABNORMAL HIGH (ref 11.5–15.5)
WBC: 8.5 10*3/uL (ref 4.0–10.5)

## 2014-04-17 LAB — CORTISOL: CORTISOL PLASMA: 33.6 ug/dL

## 2014-04-17 LAB — PROCALCITONIN: Procalcitonin: 1.41 ng/mL

## 2014-04-17 LAB — PHOSPHORUS: Phosphorus: 4.1 mg/dL (ref 2.3–4.6)

## 2014-04-17 LAB — TROPONIN I: Troponin I: <0.3 ng/mL (ref <0.30)

## 2014-04-17 LAB — MAGNESIUM: Magnesium: 1.7 mg/dL (ref 1.5–2.5)

## 2014-04-17 LAB — LACTIC ACID, PLASMA: Lactic Acid, Venous: 3.2 mmol/L — ABNORMAL HIGH (ref 0.5–2.2)

## 2014-04-17 MED ORDER — AMIODARONE LOAD VIA INFUSION
150.0000 mg | Freq: Once | INTRAVENOUS | Status: AC
  Administered 2014-04-17: 150 mg via INTRAVENOUS
  Filled 2014-04-17: qty 83.34

## 2014-04-17 MED ORDER — LEVETIRACETAM IN NACL 500 MG/100ML IV SOLN
500.0000 mg | Freq: Two times a day (BID) | INTRAVENOUS | Status: DC
  Administered 2014-04-17 (×2): 500 mg via INTRAVENOUS
  Filled 2014-04-17 (×4): qty 100

## 2014-04-17 MED ORDER — AMIODARONE IV BOLUS ONLY 150 MG/100ML: 150.0000 mg | Freq: Once | INTRAVENOUS | Status: DC

## 2014-04-17 MED ORDER — AMIODARONE HCL IN DEXTROSE 360-4.14 MG/200ML-% IV SOLN
30.0000 mg/h | INTRAVENOUS | Status: DC
  Administered 2014-04-17 (×4): 30 mg/h via INTRAVENOUS
  Filled 2014-04-17 (×7): qty 200

## 2014-04-17 MED ORDER — AMIODARONE HCL IN DEXTROSE 360-4.14 MG/200ML-% IV SOLN
60.0000 mg/h | INTRAVENOUS | Status: AC
  Administered 2014-04-17 (×2): 60 mg/h via INTRAVENOUS
  Filled 2014-04-17: qty 200

## 2014-04-17 MED ORDER — MAGNESIUM SULFATE 40 MG/ML IJ SOLN
2.0000 g | Freq: Once | INTRAMUSCULAR | Status: AC
  Administered 2014-04-17: 2 g via INTRAVENOUS
  Filled 2014-04-17: qty 50

## 2014-04-17 NOTE — Progress Notes (Signed)
Chaplain sat in with palliative care for consult. Chaplain provided emotional support for family. Pt son has a very strong support system who will support son's decisions. Family is considering waiting a few days to make a final decision. Family is clear that they would prefer to bring pt home if possible. Chaplain will follow as needed and would like to speak with son individually.  04/17/14 1400  Clinical Encounter Type  Visited With Family;Health care provider  Visit Type Spiritual support  Referral From Palliative care team  Spiritual Encounters  Spiritual Needs Emotional;Grief support  Stress Factors  Family Stress Factors Family relationships;Health changes;Loss of control;Loss;Major life changes  Joncarlo Friberg, Barbette Hair, Chaplain 04/17/2014 3:01 PM

## 2014-04-17 NOTE — Progress Notes (Addendum)
PULMONARY / CRITICAL CARE MEDICINE   Name: Patrick Lane MRN: [id number] DOB: 1927-09-28    ADMISSION DATE:  03/26/2014 CONSULTATION DATE:  03/28/2014  REFERRING MD :  EDP  CHIEF COMPLAINT:  SAH - Syncope  INITIAL PRESENTATION: 80 year old male with PHM as outlined below, which includes bladder Ca (s/p chemo, radiation. Last therapy 2-3 months ago), AAA, DM, PAF. He has been living at home with a live in helper. Family reports they were told his cancer was gone and that he is pretty functional. Still drives, cooks sometimes, etc. 9/28 he was at the store with his helper. He had an unwitnessed fall with subsequent loss of consciousness. He was brought to ED where his mental status was altered. A CT scan of his head shows extensive Left SAH. He was seen by neurosurgery in ED and they do not feel that surgical intervention is warranted at this time. They recommended close monitoring in ICU. PCCM consulted for admission.  .   STUDIES/EVENT 9/28 > admitted for [facility], likely 2nd to syncopal episode. Traumatic SAH noted CT head 9/28 - Extensive subarachnoid hemorrhage throughout the left Calvarium, small subdural hematoma on the left, small tentorial subdural hematoma on the left as well. No midline shift CT c-spine 9/28 - No fracture or spondylolisthesis. Calcification in both carotid arteries CT abdomen/pelv - left bladder mass (larger than prior study), L adrenal mass (new), Left hydro, ureteral obstruction (new). Enlarged retroperitoneal nd elft pelvic sidewall lymph nodes. AAA with little change from prior study.  04/16/14: intubated yesterday for resp distress. Concern for PE but CT angio held due to concern for high dye load and low GFR. He has hydro,making urine but creat worsning   SUBJECTIVE/OVERNIGHT/INTERVAL HX 04/17/14: A Fib overnight with RVR - now on amio gtt and in NSR. Not on pressors. Normal eCHO  And duple LE yesterday.  Sedated with diprivan.  Dr Tresa Moore of Urology recommends  palliative approach  For malignancy progression. Palliative care meeting later today  VITAL SIGNS: Temp:  [98.7 F (37.1 C)-100.6 F (38.1 C)] 98.7 F (37.1 C) (09/30 0346) Pulse Rate:  [82-147] 82 (09/30 0700) Resp:  [16-25] 16 (09/30 0700) BP: (90-156)/(43-120) 123/51 mmHg (09/30 0700) SpO2:  [95 %-100 %] 99 % (09/30 0700) FiO2 (%):  [40 %] 40 % (09/30 0700) Weight:  [72.6 kg (160 lb 0.9 oz)] 72.6 kg (160 lb 0.9 oz) (09/30 0122) HEMODYNAMICS:   VENTILATOR SETTINGS: Vent Mode:  [-] PRVC FiO2 (%):  [40 %] 40 % Set Rate:  [16 bmp] 16 bmp Vt Set:  [640 mL] 640 mL PEEP:  [5 cmH20] 5 cmH20 Plateau Pressure:  [17 cmH20-20 cmH20] 17 cmH20 INTAKE / OUTPUT:  Intake/Output Summary (Last 24 hours) at 04/17/14 0726 Last data filed at 04/17/14 0700  Gross per 24 hour  Intake 3796.19 ml  Output   1020 ml  Net 2776.19 ml    PHYSICAL EXAMINATION: General:  Elderly male ilooks critically ill on vent Neuro: Agitated when off diprivan. On  Diprivan -> RASS -2  HEENT:  [place], C-collar in place, no tenderness Cardiovascular:  Tachy, appears regular then irregular, s1 s2 IRT Lungs:  Resp's even, mildly labored on 100% NRB Abdomen:  Soft, generalized tenderness with guarding, non-distended Musculoskeletal:  No acute deformity Skin:  Intact  LABS: PULMONARY  Recent Labs Lab 04/06/2014 2218 04/16/14 0500  PHART 7.380 7.422  PCO2ART 43.6 39.1  PO2ART 382.0* 65.3*  HCO3 25.2* 25.0*  TCO2 26.5 26.2  O2SAT 99.8 93.5  CBC  Recent Labs Lab 04/07/2014 1408 04/16/14 0222 04/17/14 0322  HGB 10.4* 8.8* 7.8*  HCT 31.9* 27.6* 24.6*  WBC 13.8* 10.4 8.5  PLT 365 316 261    COAGULATION  Recent Labs Lab 04/17/2014 1408  INR 1.19    CARDIAC    Recent Labs Lab 03/19/2014 1931 04/17/14 0325  TROPONINI <0.30 <0.30   No results found for this basename: PROBNP,  in the last 168 hours   CHEMISTRY  Recent Labs Lab 03/25/2014 1408 04/16/14 0222 04/17/14 0322  NA 136* 138 137  K  4.4 4.6 3.9  CL 95* 98 99  CO2 25 27 22   GLUCOSE 199* 147* 203*  BUN 24* 25* 27*  CREATININE 1.66* 1.74* 1.68*  CALCIUM 9.6 9.0 8.8  MG  --  1.7 1.7  PHOS  --  3.7 4.1   Estimated Creatinine Clearance: 33.6 ml/min (by C-G formula based on Cr of 1.68).   LIVER  Recent Labs Lab 03/19/2014 1408  INR 1.19     INFECTIOUS  Recent Labs Lab 04/17/14 0322  LATICACIDVEN 3.2*  PROCALCITON 1.41     ENDOCRINE CBG (last 3)   Recent Labs  04/16/14 1954 04/16/14 2330 04/17/14 0343  GLUCAP 157* 162* 191*         IMAGING x48h Ct Head Wo Contrast  04/16/2014   CLINICAL DATA:  Follow up subarachnoid hemorrhage.  EXAM: CT HEAD WITHOUT CONTRAST  TECHNIQUE: Contiguous axial images were obtained from the base of the skull through the vertex without intravenous contrast.  COMPARISON:  04/14/2014  FINDINGS: Acute left subdural, subarachnoid, and intraparenchymal hemorrhages with fluid layering over the tentorium. Subdural hematoma measures about 7 mm depth and appears to be increasing since the previous study. Left intraparenchymal hematoma is demonstrated in the frontal, parietal, and temporal regions without significant progression. There is increased subarachnoid hemorrhage in the sylvian fissure. Tentorial hemorrhage appears to be increasing as well. Mild mass effect with some sulcal effacement. Approximately 5 mm left-to-right midline shift. No effacement of basal cisterns.  IMPRESSION: Acute left subdural, subarachnoid, and intraparenchymal hemorrhage demonstrating some progression since previous study.   Electronically Signed   By: Lucienne Capers M.D.   On: 04/16/2014 04:47       ASSESSMENT / PLAN:  PULMONARY OETT > 9/29 A: Hx of COPD  Acute hypoxemic respiratory failure following Traumatic SAH in setting of copd. PE unlikely based on indirect evidence    - . Does not meet extubation criteria due to agitation   P:   Full vnent support duoneb   CARDIOVASCULAR CVL  >  A:  Hx of PAF and AAA - minimal change from prior CT Admitted with Syncope, unclear etiology   -on Amio gtt and cardizem po following  A fib RVR overnight  P:  Dc cardizem Continue amio gtt Target SBP < 150 mm/Hg NO anticoagulation in setting of sAH Continuous cardiac monitoring May need CVL   RENAL A:   Baseline: high-grade muscle invasive bladder cancer since 04/2013 s/p TURBT x several and chemo-radiation as not cystectomy candidate. Followed by Dr Risa Grill At admission:   - AKI: likely obstructive  - Recurrence of malignancy:     - has hydro on admit CT 03/22/2014: .  New pelvic lymphadenopahty and right adrenal mass by CT worrisome for progressive disease and cause of hydro   - Urology consult 04/16/14 - recommending palliative approach   - on  04/17/14: mild hypomag  P:   Replete mag Hydrate Follow BMP Nephrostomy  tube if goals are to continue with medical care after family meet (not a candidate for [facility] drain)  GASTROINTESTINAL A:  H/o GERD     P:   SUP: IV protonix NPO  tube feeds since 04/16/14  HEMATOLOGIC A:   Anemia, chronic and due to critical illness Leukocytosis  P:  Follow CBC - PRBC for hgb < 7gm% scd No heparin due to [facility]  INFECTIOUS A:   SIRS - no obvious source immunocompromised host , s/p chemo, rads months ago   - no fever  P:   BCx2 9/28 >>> Check UA UC 9/28 >>> Monitor off ABX Trend WBC and fever curve  ENDOCRINE A:   DM  P:   CBG monitoring SSI Check cortisol if drops BP or glucose  NEUROLOGIC A:   Acute traumatic SAH Syncope R/o ligamentous injury neck - CT neck negeative   - agitated on WUA off diprivan intermittently  P:   RASS goal: 0 Neurosurgery following Repeat CT head in AM Neuro checks See cvs sections for work up Keep collar as unable to clear clinically   FAMILY Family updates:   - 9/28 - Grandson (POA) is aware of current situation and severity. Agrees to no code blue, but would still  want elective intubation if needed. No ETT during cardiac arrest   - 04/16/14: nephew and friend at bedside. They are not POA and not updated.    - 04/17/14 - no family at bedside   Interdisciplinary Family Meeting v Palliative Care Meeting:   palliative care consult goals of care - 04/17/14 pending   The patient is critically ill with multiple organ systems failure and requires high complexity decision making for assessment and support, frequent evaluation and titration of therapies, application of advanced monitoring technologies and extensive interpretation of multiple databases.   Critical Care Time devoted to patient care services described in this note is  35  Minutes.  Dr. Brand Males, M.D., [facility].C.P Pulmonary and Critical Care Medicine Staff Physician [facility] Pager: [phone], If no answer or between  15:00h - 7:00h: call [phone]  04/17/2014 7:26 AM

## 2014-04-17 NOTE — Progress Notes (Signed)
[facility] Progress Note Patient Name: CLEE PANDIT DOB: November 06, 1927 MRN: [id number]   Date of Service  04/17/2014  HPI/Events of Note  afib with rvr on amio gtt (lower dose) Chart reviewed, overall condition is non-survivable situation  eICU Interventions  Amiodarone bolus     Intervention Category Major Interventions: Arrhythmia - evaluation and management  MCQUAID, DOUGLAS 04/17/2014, 4:48 PM

## 2014-04-17 NOTE — Consult Note (Signed)
I have reviewed this case with our NP and agree with the Assessment and Plan as stated.  Elo Marmolejos L. Shadia Larose, MD MBA The Palliative Medicine Team at [place] Team Phone: [phone] Pager: [phone]   

## 2014-04-17 NOTE — Progress Notes (Addendum)
eLink Physician-Brief Progress Note Patient Name: Patrick Lane DOB: 1928-01-23 MRN: [id number]   Date of Service  04/17/2014  HPI/Events of Note  Apparently after the most recent palliative medicine conversation the patient's family had expressed some desire to use ACLS drugs in the setting of a cardiac arrest.  eICU Interventions  I have reviewed the patient's chart, my partner's notes and the palliative medicine notes.  This patient has advance medical problems and he is currently in a non-survivable state.  Any form of ACLS in the setting of a cardiac arrest would be medically futile.   Will leave code status orders as written and will not change to receive medications for CODE as this is unreasonable. Patient's family left and not available for discussion     Intervention Category Major Interventions: End of life / care limitation discussion  MCQUAID, DOUGLAS 04/17/2014, 8:13 PM

## 2014-04-17 NOTE — Progress Notes (Signed)
Patient ID: Patrick Lane, male   DOB: 1928-01-26, 80 y.o.   MRN: [id number] Subjective:  the patient is alert and attentive. He has some twitching in his lip. I don't know this is voluntary or a focal seizure.  Objective: Vital signs in last 24 hours: Temp:  [98.7 F (37.1 C)-100.6 F (38.1 C)] 98.7 F (37.1 C) (09/30 0346) Pulse Rate:  [82-147] 98 (09/30 0809) Resp:  [16-25] 17 (09/30 0809) BP: (90-156)/(43-120) 131/57 mmHg (09/30 0809) SpO2:  [95 %-100 %] 99 % (09/30 0809) FiO2 (%):  [40 %] 40 % (09/30 0809) Weight:  [72.6 kg (160 lb 0.9 oz)] 72.6 kg (160 lb 0.9 oz) (09/30 0122)  Intake/Output from previous day: 09/29 0701 - 09/30 0700 In: 3796.2 [I.V.:3106.2; NG/GT:540; IV Piggyback:150] Out: 1020 [Urine:1020] Intake/Output this shift: Total I/O In: 130 [NG/GT:130] Out: -   Physical exam the patient is Glasgow Coma Scale 10 intubated. He moves all 4 extremities. His pupils are without change.  Lab Results:  Recent Labs  04/16/14 0222 04/17/14 0322  WBC 10.4 8.5  HGB 8.8* 7.8*  HCT 27.6* 24.6*  PLT 316 261   BMET  Recent Labs  04/16/14 0222 04/17/14 0322  NA 138 137  K 4.6 3.9  CL 98 99  CO2 27 22  GLUCOSE 147* 203*  BUN 25* 27*  CREATININE 1.74* 1.68*  CALCIUM 9.0 8.8    Studies/Results: Ct Head Wo Contrast  04/16/2014   CLINICAL DATA:  Follow up subarachnoid hemorrhage.  EXAM: CT HEAD WITHOUT CONTRAST  TECHNIQUE: Contiguous axial images were obtained from the base of the skull through the vertex without intravenous contrast.  COMPARISON:  04/08/2014  FINDINGS: Acute left subdural, subarachnoid, and intraparenchymal hemorrhages with fluid layering over the tentorium. Subdural hematoma measures about 7 mm depth and appears to be increasing since the previous study. Left intraparenchymal hematoma is demonstrated in the frontal, parietal, and temporal regions without significant progression. There is increased subarachnoid hemorrhage in the sylvian fissure.  Tentorial hemorrhage appears to be increasing as well. Mild mass effect with some sulcal effacement. Approximately 5 mm left-to-right midline shift. No effacement of basal cisterns.  IMPRESSION: Acute left subdural, subarachnoid, and intraparenchymal hemorrhage demonstrating some progression since previous study.   Electronically Signed   By: Lucienne Capers M.D.   On: 04/16/2014 04:47   Ct Head Wo Contrast  03/21/2014   CLINICAL DATA:  Trauma; altered mental status  EXAM: CT HEAD WITHOUT CONTRAST  CT CERVICAL SPINE WITHOUT CONTRAST  TECHNIQUE: Multidetector CT imaging of the head and cervical spine was performed following the standard protocol without intravenous contrast. Multiplanar CT image reconstructions of the cervical spine were also generated.  COMPARISON:  October 10, 2009  FINDINGS: CT HEAD FINDINGS  There is extensive subarachnoid hemorrhage throughout the left calvarium. There is some hemorrhage extending into the tentorium consistent with Stentor radial subdural hematoma. There is also a small subdural hematoma on the left in the temporal region with a maximum thickness of 5 mm. There is no appreciable midline shift.  There is underlying moderate generalized atrophy. There is mild diffuse atrophy with mild patchy small vessel disease in the centra semiovale bilaterally.  There is no demonstrable fracture ; the bony calvarium appears intact. The mastoid air cells are clear. There is rightward deviation of the nasal septum.  CT CERVICAL SPINE FINDINGS  There is no appreciable fracture or spondylolisthesis. Prevertebral soft tissues and predental space regions are normal. There is mild disc space narrowing at C5-6  and C6-7. There is multifocal osteoarthritic change with exit foraminal narrowing at multiple levels. These changes are most pronounced at C3-4 on the left, C4-5 on the left, and C5-6 on the right. No disc extrusion or stenosis. There is calcification in both carotid arteries.  IMPRESSION: CT  head: Extensive subarachnoid hemorrhage throughout the left calvarium, most pronounced in the left temporal lobe with foci also present in the frontal and parietal lobes on the left. There is a small subdural hematoma on the left measuring 5 mm in maximal thickness. There is a small tentorial subdural hematoma on the left as well. No midline shift. Underlying atrophy with small vessel disease. A prior lacunar type infarct in the left medial temporal lobe currently contains a focus of hemorrhage.  CT cervical spine: Multifocal osteoarthritic change. No fracture or spondylolisthesis. Calcification in both carotid arteries.  Critical Value/emergent results were called by telephone at the time of interpretation on 03/29/2014 at 3:59 pm to Dr. Evelina Bucy , who verbally acknowledged these results.   Electronically Signed   By: Lowella Grip M.D.   On: 04/02/2014 16:00   Ct Cervical Spine Wo Contrast  04/06/2014   CLINICAL DATA:  Trauma; altered mental status  EXAM: CT HEAD WITHOUT CONTRAST  CT CERVICAL SPINE WITHOUT CONTRAST  TECHNIQUE: Multidetector CT imaging of the head and cervical spine was performed following the standard protocol without intravenous contrast. Multiplanar CT image reconstructions of the cervical spine were also generated.  COMPARISON:  October 10, 2009  FINDINGS: CT HEAD FINDINGS  There is extensive subarachnoid hemorrhage throughout the left calvarium. There is some hemorrhage extending into the tentorium consistent with Stentor radial subdural hematoma. There is also a small subdural hematoma on the left in the temporal region with a maximum thickness of 5 mm. There is no appreciable midline shift.  There is underlying moderate generalized atrophy. There is mild diffuse atrophy with mild patchy small vessel disease in the centra semiovale bilaterally.  There is no demonstrable fracture ; the bony calvarium appears intact. The mastoid air cells are clear. There is rightward deviation of the  nasal septum.  CT CERVICAL SPINE FINDINGS  There is no appreciable fracture or spondylolisthesis. Prevertebral soft tissues and predental space regions are normal. There is mild disc space narrowing at C5-6 and C6-7. There is multifocal osteoarthritic change with exit foraminal narrowing at multiple levels. These changes are most pronounced at C3-4 on the left, C4-5 on the left, and C5-6 on the right. No disc extrusion or stenosis. There is calcification in both carotid arteries.  IMPRESSION: CT head: Extensive subarachnoid hemorrhage throughout the left calvarium, most pronounced in the left temporal lobe with foci also present in the frontal and parietal lobes on the left. There is a small subdural hematoma on the left measuring 5 mm in maximal thickness. There is a small tentorial subdural hematoma on the left as well. No midline shift. Underlying atrophy with small vessel disease. A prior lacunar type infarct in the left medial temporal lobe currently contains a focus of hemorrhage.  CT cervical spine: Multifocal osteoarthritic change. No fracture or spondylolisthesis. Calcification in both carotid arteries.  Critical Value/emergent results were called by telephone at the time of interpretation on 03/22/2014 at 3:59 pm to Dr. Evelina Bucy , who verbally acknowledged these results.   Electronically Signed   By: Lowella Grip M.D.   On: 04/06/2014 16:00   Ct Abdomen Pelvis W Contrast  04/08/2014   CLINICAL DATA:  Fall, found down, abdominal  pain, not responding to questions ; per chart history of COPD, type 2 diabetes, hyperlipidemia, abdominal aortic aneurysm, bladder cancer  EXAM: CT ABDOMEN AND PELVIS WITH CONTRAST  TECHNIQUE: Multidetector CT imaging of the abdomen and pelvis was performed using the standard protocol following bolus administration of intravenous contrast. Sagittal and coronal MPR images reconstructed from axial data set.  CONTRAST:  60mL OMNIPAQUE IOHEXOL 300 MG/ML SOLN IV. No oral  contrast administered.  COMPARISON:  01/21/2014  FINDINGS: Small LEFT pleural effusion.  Bibasilar atelectasis versus subtle infiltrate in LEFT lower lobe.  Atherosclerotic calcifications with aneurysmal dilatation of the infrarenal aorta 4.6 x 4.3 cm and 6.2 cm length image 38 previously 4.4 x 4.4 cm in axial dimensions.  Common iliac arteries measure 14 mm diameter RIGHT and 13 mm LEFT.  Liver, spleen, pancreas, LEFT adrenal gland and RIGHT kidney normal appearance.  New RIGHT adrenal mass 2.8 x 1.8 cm question metastasis.  LEFT para-aortic adenopathy identified on the previous study is now ill defined measuring in aggregate 2.7 x 2.4 cm with scattered associated infiltrative changes question related to interval treatment for bladder cancer?  The infiltration in LEFT periaortic space is not felt to be due to the aortic aneurysm.  LEFT hydronephrosis and proximal hydroureter with delay in LEFT nephrogram and mild diffuse perinephric edema.  LEFT ureteral dilatation terminates at the LEFT periaortic adenopathy.  Enhancing soft tissue tumor identified at the LEFT posterolateral urinary bladder compatible of bladder cancer, increased in size since previous exam, now measuring approximately 4.0 cm in greatest dimension and up to 1.5 cm thick.  Diffuse infiltration of prevesical fat may reflect tumor extension into the prevesical space.  10 mm short axis aortocaval lymph node image 32 previously 8 mm.  Enlarged LEFT pelvic sidewall lymph node 11 mm short axis image 68.  No additional mass, ascites, free air, or definite osseous metastatic lesion.  Small focus of nonspecific gas is seen in the retrocrural space, of uncertain etiology image 9.  IMPRESSION: Increased size of LEFT lateral bladder mass with suspected extension into the prevesical space.  New LEFT adrenal mass consistent with metastasis.  New LEFT hydronephrosis and proximal hydroureter suspect related to ureteral obstruction secondary to inflammatory changes  at previously identified LEFT para-aortic adenopathy, question undergoing chemotherapy? .  Enlarged retroperitoneal and LEFT pelvic sidewall lymph nodes.  Abdominal aortic aneurysm 4.3 x 4.6 x 6.2 cm little changed.  Small LEFT pleural effusion.   Electronically Signed   By: Lavonia Dana M.D.   On: 04/16/2014 16:20   Dg Pelvis Portable  04/11/2014   CLINICAL DATA:  Pain post trauma  EXAM: PORTABLE PELVIS 1-2 VIEWS  COMPARISON:  None.  FINDINGS: There is fairly mild symmetric narrowing of both hip joints. There is no demonstrable fracture or dislocation. No erosive change. There is arterial vascular calcification bilaterally. There is periarticular osteoporosis bilaterally.  IMPRESSION: Periarticular osteoporosis and osteoarthritic change in both hip joints. No acute fracture or dislocation apparent.   Electronically Signed   By: Lowella Grip M.D.   On: 03/30/2014 15:04   Dg Chest Port 1 View  04/17/2014   CLINICAL DATA:  Hypoxia  EXAM: PORTABLE CHEST - 1 VIEW  COMPARISON:  April 15, 2014  FINDINGS: Endotracheal tube tip is 4.8 cm above the carina. Nasogastric tube tip and side port are in the stomach. No pneumothorax. There is mild patchy infiltrate in both lung bases, stable on the right. Subtle new infiltrate is noted in the left base. Elsewhere lungs are  clear. Heart is normal in size and contour. Pulmonary vascular is within normal limits.  IMPRESSION: Mild patchy infiltrate bilaterally, new on the left and stable on the right. No change in cardiac silhouette. Tube positions as described without pneumothorax.   Electronically Signed   By: Lowella Grip M.D.   On: 04/17/2014 07:59   Dg Chest Port 1 View  04/17/2014   CLINICAL DATA:  Hypoxia  EXAM: PORTABLE CHEST - 1 VIEW  COMPARISON:  Study obtained earlier in the day  FINDINGS: Endotracheal tube tip is 3.1 cm above the carina. Nasogastric tube tip and side port are in the stomach. There is no demonstrable pneumothorax. There is a small area  of infiltrate in the right base. Elsewhere lungs are clear. Heart size and pulmonary vascularity are normal. No adenopathy. There is acromioclavicular separation on the left.  IMPRESSION: Tube positions as described without pneumothorax. Small area of infiltrate right base. Acromioclavicular separation on left.   Electronically Signed   By: Lowella Grip M.D.   On: 04/16/2014 21:37   Dg Chest Port 1 View  03/19/2014   CLINICAL DATA:  Status post fall today; history of COPD, tobacco use, diabetes, and atrial fibrillation  EXAM: PORTABLE CHEST - 1 VIEW  COMPARISON:  None.  FINDINGS: The lungs are adequately inflated. There is mildly increased prominence of the right hilum as compared to the previous study. The cardiac silhouette is normal in size. The pulmonary vascularity is not engorged. There is no pleural effusion or pneumothorax. The trachea is midline. There is a mildly displaced acute fracture the posterior aspect of the right fifth rib. There may be deformities of the lateral aspects of the third and fourth ribs.  IMPRESSION: 1. There is an acute mildly displaced fracture of the posterior aspect of the right fifth rib. There may be fractures of the lateral aspects of the right third and fourth ribs. There is no pneumothorax or pleural effusion. 2. There is no evidence of CHF. Mild hilar prominence on the right is noted and is more conspicuous than in the past. When the patient can tolerate the procedure, a PA and lateral chest x-ray would be useful in an effort to reassess the hilar structures.   Electronically Signed   By: David  Martinique   On: 03/26/2014 15:04   Dg Abd Portable 1v  03/27/2014   CLINICAL DATA:  Enteric tube placement.  EXAM: PORTABLE ABDOMEN - 1 VIEW  COMPARISON:  04/13/2014.  FINDINGS: Enteric tube is present with the tip in the gastric fundus. Proximal side port in the proximal gastric fundus.  IMPRESSION: Nasogastric tube in the stomach.   Electronically Signed   By: Dereck Ligas M.D.   On: 04/03/2014 21:36    Assessment/Plan: Left subarachnoid hemorrhage, subdural hematoma: The patient has some rhythmic lip motions. Possibly focal seizures. I will start [place]. I am reluctant to give this frail 80 year old man IV benzodiazepines.  LOS: 2 days     Ainara Eldridge D 04/17/2014, 8:28 AM

## 2014-04-17 NOTE — Progress Notes (Signed)
Notified e-link MD about pt converting to A fib with RVR. Stat ECG was ordered and obtained. Amio gtt started. Will continue to monitor.

## 2014-04-17 NOTE — Progress Notes (Signed)
[facility] Progress Note Patient Name: Patrick Lane DOB: 1928-02-24 MRN: [id number]   Date of Service  04/17/2014  HPI/Events of Note  Fib/flutter 2:1 flutter rate 147/min Fib rates in 120s BP 94/52  eICU Interventions  Amiodarone protocol     Intervention Category Major Interventions: Arrhythmia - evaluation and management  Merton Border 04/17/2014, 1:48 AM

## 2014-04-17 NOTE — Progress Notes (Signed)
  Amiodarone Drug - Drug Interaction Consult Note  Recommendations: Monitor QTC/vitals closely   Amiodarone is metabolized by the cytochrome P450 system and therefore has the potential to cause many drug interactions. Amiodarone has an average plasma half-life of 50 days (range 20 to 100 days).   There is potential for drug interactions to occur several weeks or months after stopping treatment and the onset of drug interactions may be slow after initiating amiodarone.   []  Statins: Increased risk of myopathy. Simvastatin- restrict dose to 20mg  daily. Other statins: counsel patients to report any muscle pain or weakness immediately.  []  Anticoagulants: Amiodarone can increase anticoagulant effect. Consider warfarin dose reduction. Patients should be monitored closely and the dose of anticoagulant altered accordingly, remembering that amiodarone levels take several weeks to stabilize.  []  Antiepileptics: Amiodarone can increase plasma concentration of phenytoin, the dose should be reduced. Note that small changes in phenytoin dose can result in large changes in levels. Monitor patient and counsel on signs of toxicity.  []  Beta blockers: increased risk of bradycardia, AV block and myocardial depression. Sotalol - avoid concomitant use.  [x]   Calcium channel blockers (diltiazem and verapamil): increased risk of bradycardia, AV block and myocardial depression.  []   Cyclosporine: Amiodarone increases levels of cyclosporine. Reduced dose of cyclosporine is recommended.  []  Digoxin dose should be halved when amiodarone is started.  []  Diuretics: increased risk of cardiotoxicity if hypokalemia occurs.  []  Oral hypoglycemic agents (glyburide, glipizide, glimepiride): increased risk of hypoglycemia. Patient's glucose levels should be monitored closely when initiating amiodarone therapy.   []  Drugs that prolong the QT interval:  Torsades de pointes risk may be increased with concurrent use - avoid if  possible.  Monitor QTc, also keep magnesium/potassium WNL if concurrent therapy can't be avoided. Marland Kitchen Antibiotics: e.g. fluoroquinolones, erythromycin. . Antiarrhythmics: e.g. quinidine, procainamide, disopyramide, sotalol. . Antipsychotics: e.g. phenothiazines, haloperidol.  . Lithium, tricyclic antidepressants, and methadone. Thank You,  Narda Bonds  04/17/2014 1:51 AM

## 2014-04-17 NOTE — Progress Notes (Signed)
Cannot perform Carotid Doppler due to neck brace. Once neck brace is removed, if Carotid Doppler is still needed, please reorder.  Landry Mellow V[facility] lab 04/17/2014

## 2014-04-17 NOTE — Progress Notes (Signed)
Chaplain followed up with family. Chaplain introduced herself to new family members present. Chaplain made family aware of on-call chaplain presence should they need it. Chaplain will follow as needed.   04/17/14 1600  Clinical Encounter Type  Visited With Patient and family together  Visit Type Follow-up  Spiritual Encounters  Spiritual Needs Emotional  Rolly Salter, Chaplain 4:56 PM 04/17/2014

## 2014-04-18 DIAGNOSIS — C672 Malignant neoplasm of lateral wall of bladder: Secondary | ICD-10-CM

## 2014-04-18 DIAGNOSIS — J9601 Acute respiratory failure with hypoxia: Secondary | ICD-10-CM

## 2014-04-18 DIAGNOSIS — I609 Nontraumatic subarachnoid hemorrhage, unspecified: Secondary | ICD-10-CM

## 2014-04-18 LAB — GLUCOSE, CAPILLARY
GLUCOSE-CAPILLARY: 137 mg/dL — AB (ref 70–99)
Glucose-Capillary: 120 mg/dL — ABNORMAL HIGH (ref 70–99)
Glucose-Capillary: 126 mg/dL — ABNORMAL HIGH (ref 70–99)
Glucose-Capillary: 131 mg/dL — ABNORMAL HIGH (ref 70–99)
Glucose-Capillary: 139 mg/dL — ABNORMAL HIGH (ref 70–99)
Glucose-Capillary: 149 mg/dL — ABNORMAL HIGH (ref 70–99)

## 2014-04-18 LAB — CBC WITH DIFFERENTIAL/PLATELET
BASOS ABS: 0 10*3/uL (ref 0.0–0.1)
Basophils Relative: 0 % (ref 0–1)
EOS ABS: 0.1 10*3/uL (ref 0.0–0.7)
EOS PCT: 1 % (ref 0–5)
HEMATOCRIT: 22.9 % — AB (ref 39.0–52.0)
Hemoglobin: 7.6 g/dL — ABNORMAL LOW (ref 13.0–17.0)
Lymphocytes Relative: 6 % — ABNORMAL LOW (ref 12–46)
Lymphs Abs: 0.4 10*3/uL — ABNORMAL LOW (ref 0.7–4.0)
MCH: 29.1 pg (ref 26.0–34.0)
MCHC: 33.2 g/dL (ref 30.0–36.0)
MCV: 87.7 fL (ref 78.0–100.0)
Monocytes Absolute: 0.3 10*3/uL (ref 0.1–1.0)
Monocytes Relative: 5 % (ref 3–12)
Neutro Abs: 5.7 10*3/uL (ref 1.7–7.7)
Neutrophils Relative %: 88 % — ABNORMAL HIGH (ref 43–77)
PLATELETS: 204 10*3/uL (ref 150–400)
RBC: 2.61 MIL/uL — ABNORMAL LOW (ref 4.22–5.81)
RDW: 15.9 % — AB (ref 11.5–15.5)
WBC: 6.5 10*3/uL (ref 4.0–10.5)

## 2014-04-18 LAB — BASIC METABOLIC PANEL
ANION GAP: 15 (ref 5–15)
BUN: 29 mg/dL — AB (ref 6–23)
CALCIUM: 8.4 mg/dL (ref 8.4–10.5)
CO2: 23 mEq/L (ref 19–32)
CREATININE: 1.61 mg/dL — AB (ref 0.50–1.35)
Chloride: 99 mEq/L (ref 96–112)
GFR, EST AFRICAN AMERICAN: 44 mL/min — AB (ref >90)
GFR, EST NON AFRICAN AMERICAN: 38 mL/min — AB (ref >90)
Glucose, Bld: 158 mg/dL — ABNORMAL HIGH (ref 70–99)
Potassium: 3.1 mEq/L — ABNORMAL LOW (ref 3.7–5.3)
Sodium: 137 mEq/L (ref 137–147)

## 2014-04-18 LAB — PHOSPHORUS: Phosphorus: 3 mg/dL (ref 2.3–4.6)

## 2014-04-18 LAB — PROCALCITONIN: PROCALCITONIN: 1.1 ng/mL

## 2014-04-18 LAB — MAGNESIUM: Magnesium: 1.9 mg/dL (ref 1.5–2.5)

## 2014-04-18 MED ORDER — SODIUM CHLORIDE 0.9 % IV SOLN
25.0000 ug/h | INTRAVENOUS | Status: DC
  Administered 2014-04-18 – 2014-04-22 (×3): 50 ug/h via INTRAVENOUS
  Filled 2014-04-18 (×3): qty 50

## 2014-04-18 MED ORDER — LEVETIRACETAM 100 MG/ML PO SOLN
500.0000 mg | Freq: Two times a day (BID) | ORAL | Status: DC
  Administered 2014-04-18 – 2014-04-19 (×3): 500 mg
  Filled 2014-04-18 (×4): qty 5

## 2014-04-18 MED ORDER — IPRATROPIUM-ALBUTEROL 0.5-2.5 (3) MG/3ML IN SOLN: 3.0000 mL | RESPIRATORY_TRACT | Status: DC | PRN

## 2014-04-18 MED ORDER — POTASSIUM CHLORIDE 20 MEQ/15ML (10%) PO LIQD
40.0000 meq | Freq: Once | ORAL | Status: AC
  Administered 2014-04-18: 40 meq
  Filled 2014-04-18: qty 30

## 2014-04-18 MED ORDER — PANTOPRAZOLE SODIUM 40 MG PO PACK
40.0000 mg | PACK | Freq: Every day | ORAL | Status: DC
  Administered 2014-04-18 – 2014-04-21 (×4): 40 mg
  Filled 2014-04-18 (×5): qty 20

## 2014-04-18 MED ORDER — AMIODARONE HCL 200 MG PO TABS
400.0000 mg | ORAL_TABLET | Freq: Two times a day (BID) | ORAL | Status: DC
  Administered 2014-04-18 – 2014-04-21 (×8): 400 mg via ORAL
  Filled 2014-04-18 (×10): qty 2

## 2014-04-18 MED ORDER — FENTANYL CITRATE 0.05 MG/ML IJ SOLN: 25.0000 ug | INTRAMUSCULAR | Status: DC | PRN

## 2014-04-18 NOTE — Progress Notes (Signed)
PULMONARY / CRITICAL CARE MEDICINE   Name: Patrick Lane MRN: [id number] DOB: 22-Jun-1928    ADMISSION DATE:  03/26/2014 CONSULTATION DATE:  04/17/2014  REFERRING MD :  EDP  CHIEF COMPLAINT:  SAH - Syncope  INITIAL PRESENTATION: 80 year old male with PHM as outlined below, which includes bladder Ca (s/p chemo, radiation. Last therapy 2-3 months ago), AAA, DM, PAF. He has been living at home with a live in helper. Family reports they were told his cancer was gone and that he is pretty functional. Still drives, cooks sometimes, etc. 9/28 he was at the store with his helper. He had an unwitnessed fall with subsequent loss of consciousness. He was brought to ED where his mental status was altered. A CT scan of his head shows extensive Left SAH. He was seen by neurosurgery in ED and they do not feel that surgical intervention is warranted at this time. They recommended close monitoring in ICU. PCCM consulted for admission.  .   STUDIES/EVENT 9/28 > admitted for [facility], likely 2nd to syncopal episode. Traumatic SAH noted CT head 9/28 - Extensive subarachnoid hemorrhage throughout the left Calvarium, small subdural hematoma on the left, small tentorial subdural hematoma on the left as well. No midline shift CT c-spine 9/28 - No fracture or spondylolisthesis. Calcification in both carotid arteries CT abdomen/pelv - left bladder mass (larger than prior study), L adrenal mass (new), Left hydro, ureteral obstruction (new). Enlarged retroperitoneal nd elft pelvic sidewall lymph nodes. AAA with little change from prior study.  04/16/14: intubated yesterday for resp distress. Concern for PE but CT angio held due to concern for high dye load and low GFR. He has hydro,making urine but creat worsning   SUBJECTIVE/OVERNIGHT/INTERVAL HX RASS +1. Appears aware. Not F/C  VITAL SIGNS: Temp:  [98.2 F (36.8 C)-99.1 F (37.3 C)] 98.8 F (37.1 C) (10/01 2000) Pulse Rate:  [85-101] 86 (10/01 1954) Resp:  [16-26]  16 (10/01 1954) BP: (126-160)/(49-74) 150/57 mmHg (10/01 1954) SpO2:  [98 %-100 %] 98 % (10/01 1954) FiO2 (%):  [40 %] 40 % (10/01 1954) Weight:  [76.8 kg (169 lb 5 oz)] 76.8 kg (169 lb 5 oz) (10/01 0314) HEMODYNAMICS:   VENTILATOR SETTINGS: Vent Mode:  [-] PRVC FiO2 (%):  [40 %] 40 % Set Rate:  [16 bmp] 16 bmp Vt Set:  [640 mL] 640 mL PEEP:  [5 cmH20] 5 cmH20 Plateau Pressure:  [15 cmH20-23 cmH20] 17 cmH20 INTAKE / OUTPUT:  Intake/Output Summary (Last 24 hours) at 04/18/14 2043 Last data filed at 04/18/14 1900  Gross per 24 hour  Intake 3301.02 ml  Output   1340 ml  Net 1961.02 ml    PHYSICAL EXAMINATION: General:  Elderly male ilooks critically ill on vent Neuro: R hemiparesis HEENT:  [place], C-collar in place Cardiovascular:  Tachy, appears regular then irregular, s1 s2 IRT Lungs:  Resp's even, mildly labored on 100% NRB Abdomen:  Soft, generalized tenderness with guarding, non-distended Musculoskeletal:  No acute deformity Skin:  Intact  LABS: I have reviewed all of today's lab results. Relevant abnormalities are discussed in the A/P section  CXR: NNF   ASSESSMENT / PLAN:  PULMONARY OETT 9/29 >>  A: Hx of COPD  Acute hypoxemic respiratory failure following Traumatic SAH in setting of copd. PE unlikely based on indirect evidence    - . Does not meet extubation criteria due to agitation   P:   Cont full vent support - settings reviewed and/or adjusted Cont vent bundle Daily SBT  if/when meets criteria   CARDIOVASCULAR CVL >  A:  Hx of PAF and AAA - minimal change from prior CT Admitted with Syncope, unclear etiology   -on Amio gtt and cardizem po following  A fib RVR overnight  P:  Cont amiodarone   RENAL A:   Baseline: high-grade muscle invasive bladder cancer since 04/2013 s/p TURBT x several and chemo-radiation as not cystectomy candidate. Followed by Dr Risa Grill At admission:   - AKI: likely obstructive  - Recurrence of malignancy:     - has  hydro on admit CT 04/03/2014: .  New pelvic lymphadenopahty and right adrenal mass by CT worrisome for progressive disease and cause of hydro   - Urology consult 04/16/14 - recommending palliative approach   - on  04/17/14: mild hypomag  P:   Monitor BMET intermittently Monitor I/Os Correct electrolytes as indicated   GASTROINTESTINAL A:  H/o GERD P:   SUP: PPI Cont  tube feeds since 04/16/14  HEMATOLOGIC A:   Anemia, chronic and due to critical illness P:  DVT px: SCDs Monitor CBC intermittently Transfuse per usual ICU guidelines   INFECTIOUS A:   No overt infectiond  P:   BCx2 9/28 >>> Check UA UC 9/28 >>> Monitor temp, WBC count Monitor off abx  ENDOCRINE A:   DM  P:   CBG monitoring Cont SSI  NEUROLOGIC A:   Acute traumatic SAH Syncope R/o ligamentous injury neck - CT neck negeative   - agitated on WUA off diprivan intermittently  P:   RASS goal: 0 Neurosurgery following Repeat CT head in AM Neuro checks See cvs sections for work up Keep collar as unable to clear clinically   FAMILY Family updates:   - 9/28 - Grandson (POA) is aware of current situation and severity. Agrees to no code blue, but would still want elective intubation if needed. No ETT during cardiac arrest   - 04/16/14: nephew and friend at bedside. They are not POA and not updated.    - 04/17/14 - no family at bedside    Interdisciplinary Family Meeting v Palliative Care Meeting:   palliative care consult goals of care    The patient is critically ill with multiple organ systems failure and requires high complexity decision making for assessment and support, frequent evaluation and titration of therapies, application of advanced monitoring technologies and extensive interpretation of multiple databases.   Critical Care Time devoted to patient care services described in this note is  35  Minutes.  Merton Border, MD ; [facility] [phone].  After 5:30 PM or  weekends, call [phone]   04/18/2014 8:43 PM

## 2014-04-18 NOTE — Progress Notes (Signed)
[facility] Progress Note Patient Name: Patrick Lane DOB: 16-Oct-1927 MRN: [id number]   Date of Service  04/18/2014  HPI/Events of Note    eICU Interventions  Hypokalemia -repleted      Intervention Category Minor Interventions: Electrolytes abnormality - evaluation and management  ALVA,RAKESH V. 04/18/2014, 5:05 AM

## 2014-04-18 NOTE — Progress Notes (Signed)
   This NP received a message from Patrick Lane that he could not meet today for scheduled appointment discussing Patrick Lane and options.  He needs more time before any decision regarding de-escalation of care  He has confirmed a re-schedule meeting for tomorrow at 2 pm.  [name] NP with PMT will be meeting with the family.  [name] NP  Palliative Medicine Team Team Phone # [phone] Pager [phone]

## 2014-04-18 NOTE — Progress Notes (Signed)
Chaplain initiated follow up with pt and family. Pt nephew engaged with chaplain over spiritual distress. Chaplain offered comfort and scriptures reinforcing his worth. Family asked for chaplain to pray with pt. Chaplain will follow as needed.   04/18/14 1100  Clinical Encounter Type  Visited With Patient and family together  Visit Type Follow-up;Spiritual support  Spiritual Encounters  Spiritual Needs Emotional;Prayer  Stress Factors  Family Stress Factors Loss  Rolly Salter, Chaplain 11:24 AM 04/18/2014

## 2014-04-18 NOTE — Progress Notes (Signed)
[facility] ADULT ICU REPLACEMENT PROTOCOL FOR AM LAB REPLACEMENT ONLY  The patient does not apply for the [facility] Adult ICU Electrolyte Replacment Protocol based on the criteria listed below:    Is GFR >/= 40 ml/min? No.  Patient's GFR today is 38   Abnormal electrolyte(s): K3.1  6. If a panic level lab has been reported, has the CCM MD in charge been notified? Yes.  .   Physician:  Rockwell Alexandria MD  Vear Clock 04/18/2014 5:01 AM

## 2014-04-18 DEATH — deceased

## 2014-04-19 ENCOUNTER — Inpatient Hospital Stay (HOSPITAL_COMMUNITY): Payer: Medicare HMO

## 2014-04-19 DIAGNOSIS — J471 Bronchiectasis with (acute) exacerbation: Secondary | ICD-10-CM

## 2014-04-19 DIAGNOSIS — J449 Chronic obstructive pulmonary disease, unspecified: Secondary | ICD-10-CM

## 2014-04-19 DIAGNOSIS — E43 Unspecified severe protein-calorie malnutrition: Secondary | ICD-10-CM

## 2014-04-19 LAB — CULTURE, RESPIRATORY W GRAM STAIN

## 2014-04-19 LAB — GLUCOSE, CAPILLARY
GLUCOSE-CAPILLARY: 135 mg/dL — AB (ref 70–99)
GLUCOSE-CAPILLARY: 131 mg/dL — AB (ref 70–99)
GLUCOSE-CAPILLARY: 124 mg/dL — AB (ref 70–99)
Glucose-Capillary: 122 mg/dL — ABNORMAL HIGH (ref 70–99)
Glucose-Capillary: 126 mg/dL — ABNORMAL HIGH (ref 70–99)
Glucose-Capillary: 128 mg/dL — ABNORMAL HIGH (ref 70–99)

## 2014-04-19 LAB — BASIC METABOLIC PANEL
Anion gap: 13 (ref 5–15)
BUN: 32 mg/dL — ABNORMAL HIGH (ref 6–23)
CO2: 23 meq/L (ref 19–32)
CREATININE: 1.54 mg/dL — AB (ref 0.50–1.35)
Calcium: 8.5 mg/dL (ref 8.4–10.5)
Chloride: 102 mEq/L (ref 96–112)
GFR calc Af Amer: 46 mL/min — ABNORMAL LOW (ref >90)
GFR calc non Af Amer: 40 mL/min — ABNORMAL LOW (ref >90)
Glucose, Bld: 134 mg/dL — ABNORMAL HIGH (ref 70–99)
Potassium: 4.6 mEq/L (ref 3.7–5.3)
Sodium: 138 mEq/L (ref 137–147)

## 2014-04-19 LAB — CULTURE, RESPIRATORY

## 2014-04-19 MED ORDER — LORAZEPAM 2 MG/ML IJ SOLN
0.5000 mg | INTRAMUSCULAR | Status: DC | PRN
  Administered 2014-04-20: 1 mg via INTRAVENOUS
  Filled 2014-04-19 (×2): qty 1

## 2014-04-19 MED ORDER — LEVETIRACETAM 100 MG/ML PO SOLN
1000.0000 mg | Freq: Two times a day (BID) | ORAL | Status: DC
  Administered 2014-04-19 – 2014-04-22 (×6): 1000 mg
  Filled 2014-04-19 (×10): qty 10

## 2014-04-19 MED ORDER — VITAL AF 1.2 CAL PO LIQD
1000.0000 mL | ORAL | Status: DC
  Administered 2014-04-19 – 2014-04-22 (×3): 1000 mL
  Filled 2014-04-19 (×8): qty 1000

## 2014-04-19 MED ORDER — LEVETIRACETAM 100 MG/ML PO SOLN
1000.0000 mg | Freq: Two times a day (BID) | ORAL | Status: DC
  Filled 2014-04-19: qty 10

## 2014-04-19 NOTE — Progress Notes (Signed)
[place] Progress Note Patient Name: ABDIEL BLACKERBY DOB: 13-Jul-1928 MRN: [id number]   Date of Service  04/19/2014  HPI/Events of Note  Seizures > intermittent, facial, last about 2 min  eICU Interventions  Prn ativan RN to check with nurse about dosing, does he need loading dose?     Intervention Category Major Interventions: Seizures - evaluation and management  Leavy Heatherly 04/19/2014, 6:33 PM

## 2014-04-19 NOTE — Progress Notes (Signed)
Progress Note from the Palliative Medicine Team at [place]: I met today with [name], Patrick Lane, along with various friends/family members. Patrick Lane is upset and feels like he has been pushed into making a decision to extubate when he is not comfortable to do so. He is upset that they were told to have a time frame. I explained that this was said because the ETT is a temporary measure. The decision was made that they would not proceed with tracheostomy and that Patrick Lane will decide to extubate but he is not prepared to do this today. They had numerous questions that I answered and explained. Patrick Lane requested another head scan but I explained this is not really indicated since it sounds like his alertness/awakeness and movements have improved according to what family is telling me. One of the friends asked about SBT and I explained this is being done and he weaned 4-5 hours today but today is the first day for any progress. Explained the unpredictability of extubation and that extubation does not necessarily mean immediate death. We discussed his head trauma in relation to his worsening bladder cancer and Patrick Lane is frustrated that everyone continues discussing his cancer because we are "treating his head not his cancer here." I explained that the cancer is using energy and taking strength away from him that makes recovery much more difficult - also discussed poor prognosis and quality of life. Patrick Lane is struggling to make a decision and is not even able to tell me that if extubation is indicated that he would not want him reintubated - explained that this is not Patrick Lane's wishes. Another friend mentioned "if his ribs are broken this will make him not breath, I know, I have had broken ribs" - I had to point out that he did not need a ventilator to breath with his broken ribs. Will need follow up.      Objective: Allergies  Allergen Reactions  . Celebrex [Celecoxib] Hives    Scheduled Meds: . amiodarone  400 mg Oral BID  . antiseptic oral rinse  7 mL Mouth Rinse QID  . chlorhexidine  15 mL Mouth Rinse BID  . insulin aspart  0-9 Units Subcutaneous 6 times per day  . levETIRAcetam  1,000 mg Per Tube BID  . pantoprazole sodium  40 mg Per Tube Q1200  . piperacillin-tazobactam (ZOSYN)  IV  3.375 g Intravenous 3 times per day   Continuous Infusions: . feeding supplement (VITAL AF 1.2 CAL)    . fentaNYL infusion INTRAVENOUS 50 mcg/hr (04/19/14 0900)   PRN Meds:.fentaNYL, ipratropium-albuterol  BP 139/56  Pulse 103  Temp(Src) 99.7 F (37.6 C) (Axillary)  Resp 21  Ht _0  (1.854 m)  Wt 76.8 kg (169 lb 5 oz)  BMI 22.34 kg/m2  SpO2 99%    Intake/Output Summary (Last 24 hours) at 04/19/14 1504 Last data filed at 04/19/14 1100  Gross per 24 hour  Intake   1408 ml  Output    710 ml  Net    698 ml      LBM: 04/10/2014  Physical Exam:  General: NAD, intubated but awakens HEENT: ETT, moist mucous membrane, witness twitching movement of mouth suspicious of focal seizure Chest: No labored breathing, mechanical ventilation CVS: Tachycardic on monitor - sinus tach Abdomen: Soft, NT, +BS Ext: No edema, warm to touch Neuro: Alert, unable to assess orientation, + tracking, MAE  Labs: CBC    Component Value Date/Time   WBC 6.5 04/18/2014 0300  WBC 7.9 01/21/2014 1127   RBC 2.61* 04/18/2014 0300   RBC 3.87* 01/21/2014 1127   RBC 2.53* 09/23/2013 0505   HGB 7.6* 04/18/2014 0300   HGB 11.8* 01/21/2014 1127   HCT 22.9* 04/18/2014 0300   HCT 36.5* 01/21/2014 1127   PLT 204 04/18/2014 0300   PLT 238 01/21/2014 1127   MCV 87.7 04/18/2014 0300   MCV 94.3 01/21/2014 1127   MCH 29.1 04/18/2014 0300   MCH 30.5 01/21/2014 1127   MCHC 33.2 04/18/2014 0300   MCHC 32.3 01/21/2014 1127   RDW 15.9* 04/18/2014 0300   RDW 15.8* 01/21/2014 1127   LYMPHSABS 0.4* 04/18/2014 0300   LYMPHSABS 1.1 01/21/2014 1127   MONOABS 0.3 04/18/2014 0300   MONOABS 0.5 01/21/2014 1127   EOSABS 0.1 04/18/2014  0300   EOSABS 0.6* 01/21/2014 1127   BASOSABS 0.0 04/18/2014 0300   BASOSABS 0.0 01/21/2014 1127    BMET    Component Value Date/Time   NA 138 04/19/2014 0237   NA 140 01/21/2014 1127   K 4.6 04/19/2014 0237   K 4.5 01/21/2014 1127   CL 102 04/19/2014 0237   CO2 23 04/19/2014 0237   CO2 28 01/21/2014 1127   GLUCOSE 134* 04/19/2014 0237   GLUCOSE 96 01/21/2014 1127   BUN 32* 04/19/2014 0237   BUN 16.0 01/21/2014 1127   CREATININE 1.54* 04/19/2014 0237   CREATININE 1.0 01/21/2014 1127   CALCIUM 8.5 04/19/2014 0237   CALCIUM 10.2 01/21/2014 1127   GFRNONAA 40* 04/19/2014 0237   GFRAA 46* 04/19/2014 0237    CMP     Component Value Date/Time   NA 138 04/19/2014 0237   NA 140 01/21/2014 1127   K 4.6 04/19/2014 0237   K 4.5 01/21/2014 1127   CL 102 04/19/2014 0237   CO2 23 04/19/2014 0237   CO2 28 01/21/2014 1127   GLUCOSE 134* 04/19/2014 0237   GLUCOSE 96 01/21/2014 1127   BUN 32* 04/19/2014 0237   BUN 16.0 01/21/2014 1127   CREATININE 1.54* 04/19/2014 0237   CREATININE 1.0 01/21/2014 1127   CALCIUM 8.5 04/19/2014 0237   CALCIUM 10.2 01/21/2014 1127   PROT 7.4 01/21/2014 1127   PROT 7.3 09/23/2013 0505   ALBUMIN 3.6 01/21/2014 1127   ALBUMIN 2.9* 09/23/2013 0505   AST 14 01/21/2014 1127   AST 14 09/23/2013 0505   ALT 9 01/21/2014 1127   ALT 8 09/23/2013 0505   ALKPHOS 56 01/21/2014 1127   ALKPHOS 57 09/23/2013 0505   BILITOT 0.41 01/21/2014 1127   BILITOT 0.4 09/23/2013 0505   GFRNONAA 40* 04/19/2014 0237   GFRAA 46* 04/19/2014 0237   Assessment and Plan: 1. Code Status: Partial code - no CPR or shock. Continue intubation for now.  2. Symptom Control: 1. Pain/anxiety: Fentanyl infusion titratable.  2. Altered mental status: Continue intubation, medical management, and manage seizure activity.  3. Malnutrition: Continue tube feeding for now.  3. Psycho/Social: Emotional support provided to patient, Doristine Counter, and multiple family/friends.  4. Disposition: To be determined.     Time In Time Out Total Time Spent with Patient  Total Overall Time  1415 1510 59mn 555m    Greater than 50%  of this time was spent counseling and coordinating care related to the above assessment and plan.  AlVinie SillNP Palliative Medicine Team Pager # [phone]-F 8a-5p) Team Phone # [phone]/Weekends)

## 2014-04-19 NOTE — Progress Notes (Signed)
Patient ID: Patrick Lane, male   DOB: [birth year], 80 y.o.   MRN: [id number] Vital signs are stable. Some following of commands noted even on right side. Patient remains on ventilator New changes.

## 2014-04-19 NOTE — Progress Notes (Signed)
NUTRITION FOLLOW-UP  Pt meets criteria for SEVERE MALNUTRITION in the context of chronic illness as evidenced by 23% weight loss x 3 months and severe muscle depletion.  DOCUMENTATION CODES Per approved criteria  -Severe malnutrition in the context of chronic illness   INTERVENTION: D/C Vital High Protein  Vital AF 1.2 @ 60 ml/hr Provides: 1728 kcal (97% of estimated needs), 126 grams protein, and 1167 ml H2O.  NUTRITION DIAGNOSIS: Inadequate oral intake related to inability to eat as evidenced by NPO status; ongoing.   Goal: Pt to meet >/= 90% of their estimated nutrition needs; not met.   Monitor:  Goals of care, TF tolerance, weight trends, labs  ASSESSMENT: History includes bladder Ca (s/p chemo, radiation. Last therapy 2-3 months ago). Pt lives at home with live-in helper.  Pt admitted with acute traumatic SAH, intubated 9/29.   Family meeting with Palliative care.   Patient is currently intubated on ventilator support MV: 10.2 L/min Temp (24hrs), Avg:99.2 F (37.3 C), Min:98.2 F (36.8 C), Max:99.9 F (37.7 C)  Propofol: off Magnesium and Phosphorus WNL.  Vital High Protein @ 60 ml/hr via OG tube. Provides: 1440 kcal, 126 grams protein, and 1203 ml H2O.   Height: Ht Readings from Last 1 Encounters:  04/16/14 _0  (1.854 m)    Weight: Wt Readings from Last 1 Encounters:  04/18/14 169 lb 5 oz (76.8 kg)  Weight range 156-169 lb  BMI:  Body mass index is 22.34 kg/(m^2).  Estimated Nutritional Needs: Kcal: 1790 Protein: > 110 grams  Fluid: > 1.7 L/day  Skin: intact  Diet Order: NPO   Intake/Output Summary (Last 24 hours) at 04/19/14 0946 Last data filed at 04/19/14 0900  Gross per 24 hour  Intake 1666.92 ml  Output   1160 ml  Net 506.92 ml    Last BM: 9/29   Labs:   Recent Labs Lab 04/16/14 0222 04/17/14 0322 04/18/14 0300 04/19/14 0237  NA 138 137 137 138  K 4.6 3.9 3.1* 4.6  CL 98 99 99 102  CO2 _1 BUN 25* 27* 29* 32*   CREATININE 1.74* 1.68* 1.61* 1.54*  CALCIUM 9.0 8.8 8.4 8.5  MG 1.7 1.7 1.9  --   PHOS 3.7 4.1 3.0  --   GLUCOSE 147* 203* 158* 134*    CBG (last 3)   Recent Labs  04/18/14 2355 04/19/14 0406 04/19/14 0802  GLUCAP 120* 135* 122*    Scheduled Meds: . amiodarone  400 mg Oral BID  . antiseptic oral rinse  7 mL Mouth Rinse QID  . chlorhexidine  15 mL Mouth Rinse BID  . feeding supplement (VITAL HIGH PROTEIN)  1,000 mL Per Tube Q24H  . insulin aspart  0-9 Units Subcutaneous 6 times per day  . levETIRAcetam  500 mg Per Tube BID  . pantoprazole sodium  40 mg Per Tube Q1200  . piperacillin-tazobactam (ZOSYN)  IV  3.375 g Intravenous 3 times per day    Continuous Infusions: . fentaNYL infusion INTRAVENOUS 50 mcg/hr (04/19/14 0900)   Maylon Peppers RD, [place], [place] Pager [phone] After Hours Pager

## 2014-04-19 NOTE — Progress Notes (Addendum)
PULMONARY / CRITICAL CARE MEDICINE   Name: Patrick Lane MRN: [id number] DOB: [birth year]    ADMISSION DATE:  04/13/2014 CONSULTATION DATE:  04/01/2014  REFERRING MD :  EDP  CHIEF COMPLAINT:  SAH - Syncope  INITIAL PRESENTATION: 80 year old male with PHM as outlined below, which includes bladder Ca (s/p chemo, radiation. Last therapy 2-3 months ago), AAA, DM, PAF. He has been living at home with a live in helper. Family reports they were told his cancer was gone and that he is pretty functional. Still drives, cooks sometimes, etc. 9/28 he was at the store with his helper. He had an unwitnessed fall with subsequent loss of consciousness. He was brought to ED where his mental status was altered. A CT scan of his head shows extensive Left SAH. He was seen by neurosurgery in ED and they do not feel that surgical intervention is warranted at this time. They recommended close monitoring in ICU. PCCM consulted for admission.   STUDIES/EVENT 9/28 > admitted for [facility], likely 2nd to syncopal episode. Traumatic SAH noted CT head 9/28 - Extensive subarachnoid hemorrhage throughout the left Calvarium, small subdural hematoma on the left, small tentorial subdural hematoma on the left as well. No midline shift CT c-spine 9/28 - No fracture or spondylolisthesis. Calcification in both carotid arteries CT abdomen/pelv - left bladder mass (larger than prior study), L adrenal mass (new), Left hydro, ureteral obstruction (new). Enlarged retroperitoneal nd elft pelvic sidewall lymph nodes. AAA with little change from prior study.  04/16/14: intubated yesterday for resp distress. Concern for PE but CT angio held due to concern for high dye load and low GFR. He has hydro,making urine but creat worsning  SUBJECTIVE/OVERNIGHT/INTERVAL HX Brief 2 minute seizure this AM.  VITAL SIGNS: Temp:  [98.2 F (36.8 C)-99.9 F (37.7 C)] 99.8 F (37.7 C) (10/02 0801) Pulse Rate:  [81-103] 103 (10/02 1100) Resp:  [16-21] 19  (10/02 1100) BP: (108-153)/(47-73) 125/50 mmHg (10/02 1100) SpO2:  [95 %-100 %] 95 % (10/02 1100) FiO2 (%):  [40 %] 40 % (10/02 1100)  HEMODYNAMICS:   VENTILATOR SETTINGS: Vent Mode:  [-] PSV;CPAP FiO2 (%):  [40 %] 40 % Set Rate:  [16 bmp] 16 bmp Vt Set:  [640 mL] 640 mL PEEP:  [5 cmH20] 5 cmH20 Pressure Support:  [5 cmH20] 5 cmH20 Plateau Pressure:  [15 cmH20-18 cmH20] 17 cmH20  INTAKE / OUTPUT:  Intake/Output Summary (Last 24 hours) at 04/19/14 1128 Last data filed at 04/19/14 1100  Gross per 24 hour  Intake 1666.92 ml  Output   1160 ml  Net 506.92 ml   PHYSICAL EXAMINATION: General:  Elderly male ilooks critically ill on vent, actively with facial seizure during exam that subsided before RN brought in ativan. Neuro: R hemiparesis. HEENT:  [place], C-collar in place. Cardiovascular: IRIR, Nl S1/S2, -M/R/G. Lungs: Intubated, coarse BS diffusely. Abdomen:  Soft, generalized tenderness with guarding, non-distended Musculoskeletal:  No acute deformity Skin:  Intact  LABS: I have reviewed all of today's lab results. Relevant abnormalities are discussed in the A/P section  CXR: NNF   ASSESSMENT / PLAN:  PULMONARY OETT 9/29 >>  A: Hx of COPD  Acute hypoxemic respiratory failure following Traumatic SAH in setting of copd. PE unlikely based on indirect evidence - Does not meet extubation criteria due to agitation  P:   Begin PS trials but no extubation given mental status. Cont vent bundle Titrate O2 down as able.  CARDIOVASCULAR CVL > None PIV A:  Hx  of PAF and AAA - minimal change from prior CT Admitted with Syncope, unclear etiology  -on Amio gtt and cardizem po following  A fib RVR overnight  P:  Cont amiodarone Continue PO cardizem for now at similar dose (avoid hypotension).  RENAL A:   Baseline: high-grade muscle invasive bladder cancer since 04/2013 s/p TURBT x several and chemo-radiation as not cystectomy candidate. Followed by Dr Patrick Lane At  admission:   - AKI: likely obstructive  - Recurrence of malignancy:     - has hydro on admit CT 04/02/2014: .  New pelvic lymphadenopahty and right adrenal mass by CT worrisome for progressive disease and cause of hydro   - Urology consult 04/16/14 - recommending palliative approach  - on  04/17/14: mild hypomag  P:   Monitor BMET intermittently Monitor I/Os Correct electrolytes as indicated  GASTROINTESTINAL A:  H/o GERD P:   SUP: PPI Cont  tube feeds since 04/16/14  HEMATOLOGIC A:   Anemia, chronic and due to critical illness P:  DVT px: SCDs Monitor CBC intermittently Transfuse per usual ICU guidelines  INFECTIOUS A:   No overt infectiond  P:   BCx2 9/28 >>> UA 9/28>>>blood UC 9/28 >>> Monitor temp, WBC count Monitor Zosyn start date unknown for UTI  ENDOCRINE A:   DM  P:   CBG monitoring Cont SSI  NEUROLOGIC A:   Acute traumatic SAH Syncope R/o ligamentous injury neck - CT neck negeative   - agitated on WUA off diprivan intermittently  P:   RASS goal: 0 Neurosurgery following Repeat CT of the head recommendations per neurosurgery Neuro checks Keep collar as unable to clear clinically Increase Keppra to 1000 mg BID  FAMILY Family updates:   - 9/28 - Grandson (POA) is aware of current situation and severity. Agrees to no code blue, but would still want elective intubation if needed. No ETT during cardiac arrest   - 04/16/14: nephew and friend at bedside. They are not POA and not updated.    - 04/17/14 - no family at bedside  Interdisciplinary Family Meeting v Palliative Care Meeting:   palliative care consult goals of care.  The patient is critically ill with multiple organ systems failure and requires high complexity decision making for assessment and support, frequent evaluation and titration of therapies, application of advanced monitoring technologies and extensive interpretation of multiple databases.   Critical Care Time devoted to patient  care services described in this note is  35  Minutes.  [name], M.D. [facility] Pulmonary/Critical Care Medicine. Pager: [phone]. After hours pager: [phone].  04/19/2014 11:28 AM

## 2014-04-20 ENCOUNTER — Inpatient Hospital Stay (HOSPITAL_COMMUNITY): Payer: Medicare HMO

## 2014-04-20 LAB — GLUCOSE, CAPILLARY
GLUCOSE-CAPILLARY: 127 mg/dL — AB (ref 70–99)
GLUCOSE-CAPILLARY: 134 mg/dL — AB (ref 70–99)
Glucose-Capillary: 141 mg/dL — ABNORMAL HIGH (ref 70–99)
Glucose-Capillary: 140 mg/dL — ABNORMAL HIGH (ref 70–99)
Glucose-Capillary: 137 mg/dL — ABNORMAL HIGH (ref 70–99)
Glucose-Capillary: 116 mg/dL — ABNORMAL HIGH (ref 70–99)

## 2014-04-20 LAB — CBC
HCT: 24.2 % — ABNORMAL LOW (ref 39.0–52.0)
Hemoglobin: 7.8 g/dL — ABNORMAL LOW (ref 13.0–17.0)
MCH: 28.7 pg (ref 26.0–34.0)
MCHC: 32.2 g/dL (ref 30.0–36.0)
MCV: 89 fL (ref 78.0–100.0)
PLATELETS: 228 10*3/uL (ref 150–400)
RBC: 2.72 MIL/uL — AB (ref 4.22–5.81)
RDW: 16 % — AB (ref 11.5–15.5)
WBC: 5.7 10*3/uL (ref 4.0–10.5)

## 2014-04-20 LAB — BASIC METABOLIC PANEL
ANION GAP: 13 (ref 5–15)
BUN: 40 mg/dL — AB (ref 6–23)
CHLORIDE: 101 meq/L (ref 96–112)
CO2: 23 mEq/L (ref 19–32)
Calcium: 8.8 mg/dL (ref 8.4–10.5)
Creatinine, Ser: 1.56 mg/dL — ABNORMAL HIGH (ref 0.50–1.35)
GFR calc non Af Amer: 39 mL/min — ABNORMAL LOW (ref >90)
GFR, EST AFRICAN AMERICAN: 45 mL/min — AB (ref >90)
Glucose, Bld: 150 mg/dL — ABNORMAL HIGH (ref 70–99)
Potassium: 3.9 mEq/L (ref 3.7–5.3)
Sodium: 137 mEq/L (ref 137–147)

## 2014-04-20 LAB — MAGNESIUM: Magnesium: 1.7 mg/dL (ref 1.5–2.5)

## 2014-04-20 LAB — PHOSPHORUS: PHOSPHORUS: 2.4 mg/dL (ref 2.3–4.6)

## 2014-04-20 MED ORDER — DEXTROSE 5 % IV SOLN
1.0000 g | Freq: Two times a day (BID) | INTRAVENOUS | Status: DC
  Administered 2014-04-20 – 2014-04-23 (×7): 1 g via INTRAVENOUS
  Filled 2014-04-20 (×8): qty 1

## 2014-04-20 NOTE — Progress Notes (Signed)
Patient ID: Patrick Lane, male   DOB: 1928/02/11, 80 y.o.   MRN: [id number] Has had some recent seizure activity and was given Ativan. He is difficult to arouse at this point secondary to these things. The nurse reports that his neurologic exam is unchanged otherwise. Continue current management

## 2014-04-20 NOTE — Progress Notes (Signed)
PULMONARY / CRITICAL CARE MEDICINE   Name: Patrick Lane MRN: [id number] DOB: 06/27/1928    ADMISSION DATE:  04/10/2014 CONSULTATION DATE:  04/07/2014  REFERRING MD :  EDP  CHIEF COMPLAINT:  SAH - Syncope  INITIAL PRESENTATION: 80 yo with fall and LOC >> found to have Lt SAH.  PCCM asked to admit.  SIGNIFICANT EVENTS: 9/28 admitted for SAH, likely 2nd to syncopal episode. Traumatic SAH noted 9/29 consulted neurosurgery, palliative care, urology 10/2 Palliative care meeting >> limited code >> no CPR/defibrillation  STUDIES: 9/28 CT head/neck >> Lt SAH 9/28 CT abd/pelvis >> increased size Lt bladder mass with mets to Lt adrenal, Lt hydronephrosis, 6.2 cm AAA 9/29 Echo >> EF 60 to 65%, grade 1 diastolic dysfx 9/29 Doppler legs >> no DVT  SUBJECTIVE: Received ativan for sz activity  VITAL SIGNS: Temp:  [98.4 F (36.9 C)-100 F (37.8 C)] 100 F (37.8 C) (10/03 0800) Pulse Rate:  [83-113] 96 (10/03 1100) Resp:  [16-28] 21 (10/03 1100) BP: (100-169)/(41-87) 132/52 mmHg (10/03 1100) SpO2:  [88 %-100 %] 99 % (10/03 1100) FiO2 (%):  [40 %] 40 % (10/03 1100)  VENTILATOR SETTINGS: Vent Mode:  [-] PSV;CPAP FiO2 (%):  [40 %] 40 % Set Rate:  [16 bmp] 16 bmp Vt Set:  [640 mL] 640 mL PEEP:  [5 cmH20] 5 cmH20 Pressure Support:  [5 cmH20] 5 cmH20 Plateau Pressure:  [17 cmH20-22 cmH20] 17 cmH20  INTAKE / OUTPUT:  Intake/Output Summary (Last 24 hours) at 04/20/14 1159 Last data filed at 04/20/14 1100  Gross per 24 hour  Intake   1646 ml  Output   1495 ml  Net    151 ml   PHYSICAL EXAMINATION: General:  Elderly male ilooks critically ill on vent, more interactive. Neuro: R hemiparesis. HEENT:  [place], C-collar in place. Cardiovascular: IRIR, Nl S1/S2, -M/R/G. Lungs: Intubated, coarse BS diffusely. Abdomen:  Soft, generalized tenderness with guarding, non-distended Musculoskeletal:  No acute deformity Skin:  Intact  LABS: CBC Recent Labs     04/18/14  0300  04/20/14  0300  WBC  6.5  5.7  HGB  7.6*  7.8*  HCT  22.9*  24.2*  PLT  204  228    BMET Recent Labs     04/18/14  0300  04/19/14  0237  04/20/14  0300  NA  137  138  137  K  3.1*  4.6  3.9  CL  99  102  101  CO2  23  23  23  BUN  29*  32*  40*  CREATININE  1.61*  1.54*  1.56*  GLUCOSE  158*  134*  150*    Electrolytes Recent Labs     04/18/14  0300  04/19/14  0237  04/20/14  0300  CALCIUM  8.4  8.5  8.8  MG  1.9   --   1.7  PHOS  3.0   --   2.4    Sepsis Markers Recent Labs     04/18/14  0300  PROCALCITON  1.10   Glucose Recent Labs     04/19/14  1611  04/19/14  2042  04/19/14  2321  04/20/14  0333  04/20/14  0822  04/20/14  1212  GLUCAP  126*  124*  128*  141*  127*  140*    Imaging Dg Chest Port 1 View  04/20/2014   CLINICAL DATA:  Endotracheal tube. Admitted for subarachnoid hemorrhage with respiratory failure.  EXAM: PORTABLE CHEST - 1   VIEW  COMPARISON:  04/19/2014  FINDINGS: Endotracheal tube remains in place with tip at the level of the clavicular heads, well above the carina. Enteric tube courses towards the left upper abdomen, incompletely imaged. Cardiac silhouette is incompletely imaged inferiorly but grossly unchanged. Bilateral hilar enlargement is stable to slightly less prominent than on the prior study. Patchy opacities remain in the lung bases. There is partial obscuration of the left hemidiaphragm, with hazy opacity in the left lung base potentially reflecting a small pleural effusion. No pneumothorax is identified.  IMPRESSION: 1. Persistent bibasilar atelectasis. Question small left pleural effusion. 2. Persistent bilateral hilar enlargement.   Electronically Signed   By: Allen  Grady   On: 04/20/2014 08:36   Dg Chest Port 1 View  04/19/2014   CLINICAL DATA:  Hypoxia with respiratory failure  EXAM: PORTABLE CHEST - 1 VIEW  COMPARISON:  April 17, 2014 and September 22, 2013  FINDINGS: Endotracheal tube tip is 3.8 cm above the carina. Nasogastric tube  tip and side port are below the diaphragm. No pneumothorax. The mild patchy infiltrate in the bases is stable. No new opacity. Heart is normal in size and contour. Pulmonary vascularity is stable and within normal limits.  There is mild fullness in the right perihilar region compared to March 2015 examination.  IMPRESSION: Tube positions as described without pneumothorax. Mild patchy infiltrate in the bases, stable. No new opacity.  Fullness in the right perihilar region potentially could represent adenopathy. Would advise correlation with contrast enhanced chest CT when patient is sufficiently stable to permit CT imaging.   Electronically Signed   By: William  Woodruff M.D.   On: 04/19/2014 08:06     ASSESSMENT / PLAN:  PULMONARY OETT 9/29 >>  A: Acute hypoxic respiratory failrue 2nd to traumatic SAH. Hx of COPD. P:   Begin PS trials but no extubation given mental status PRN BD's F/u CXR  CARDIOVASCULAR A:  Hx of PAF and AAA - minimal change from prior CT P:  Continue amiodarone  RENAL A:   Hx of bladder cancer with obstructive nephropathy >> urology recommending conservative management. P:   Monitor renal fx, urine outpt, electrolytes  GASTROINTESTINAL A:   H/o GERD. P:   SUP: PPI Continue tube feeds while on vent  HEMATOLOGIC A:   Anemia, chronic and due to critical illness. P:  DVT px: SCDs Monitor CBC intermittently  INFECTIOUS A:   Pseudomonas tracheobronchitis. Diarrhea. P:   Day 6 of Abx >> change to fortaz 10/03  Sputum 9/28 >> Pseudomonas aerouginosa. Urine cx 10/03 >> C diff PCR 10/03 >>   ENDOCRINE A:   DM. P:   SSI  NEUROLOGIC A:   Syncope with traumatic SAH. Seizures. P:   RASS goal 0 AED's per neurosurgery  Family updates:  9/28 Updated grandson >> agreed to DNR status, but continue medical care  Interdisciplinary Family Meeting v Palliative Care Meeting:  Palliative care met with family 10/02  CC time 35 minutes.  Vineet Sood,  MD [place] Pulmonary/Critical Care 04/20/2014, 2:13 PM Pager:  [phone] After 3pm call: [phone]    

## 2014-04-20 NOTE — Progress Notes (Signed)
ANTIBIOTIC CONSULT NOTE - INITIAL  Pharmacy Consult for ceftazidime Indication: Pseudomonas tracheobronchitis   Allergies  Allergen Reactions  . Celebrex [Celecoxib] Hives    Patient Measurements: Height: 6\' 1"  (185.4 cm) Weight: 169 lb 5 oz (76.8 kg) IBW/kg (Calculated) : 79.9   Vital Signs: Temp: 98.7 F (37.1 C) (10/03 1200) Temp Source: Oral (10/03 1200) BP: 131/54 mmHg (10/03 1400) Pulse Rate: 94 (10/03 1400) Intake/Output from previous day: 10/02 0701 - 10/03 0700 In: 1714.8 [I.V.:123.8; ZD/GU:4403; IV Piggyback:150] Out: 4742 [Urine:1325] Intake/Output from this shift: Total I/O In: 455 [I.V.:35; NG/GT:420] Out: 370 [Urine:370]  Labs:  Recent Labs  04/18/14 0300 04/19/14 0237 04/20/14 0300  WBC 6.5  --  5.7  HGB 7.6*  --  7.8*  PLT 204  --  228  CREATININE 1.61* 1.54* 1.56*   Estimated Creatinine Clearance: 38.3 ml/min (by C-G formula based on Cr of 1.56). No results found for this basename: VANCOTROUGH, Corlis Leak, VANCORANDOM, [place], Nina, [place], [place], [place], TOBRARND, AMIKACINPEAK, AMIKACINTROU, AMIKACIN,  in the last 72 hours   Microbiology: Recent Results (from the past 720 hour(s))  MRSA PCR SCREENING     Status: None   Collection Time    04/01/2014  8:14 PM      Result Value Ref Range Status   MRSA by PCR NEGATIVE  NEGATIVE Final   Comment:            The GeneXpert MRSA Assay (FDA     approved for NASAL specimens     only), is one component of a     comprehensive MRSA colonization     surveillance program. It is not     intended to diagnose MRSA     infection nor to guide or     monitor treatment for     MRSA infections.  CULTURE, RESPIRATORY (NON-EXPECTORATED)     Status: None   Collection Time    04/03/2014 10:11 PM      Result Value Ref Range Status   Specimen Description TRACHEAL ASPIRATE   Final   Special Requests NONE   Final   Gram Stain     Final   Value: FEW WBC PRESENT, PREDOMINANTLY MONONUCLEAR     NO  SQUAMOUS EPITHELIAL CELLS SEEN     RARE GRAM NEGATIVE RODS     RARE YEAST     Performed at [facility]   Culture     Final   Value: MODERATE PSEUDOMONAS AERUGINOSA     Performed at [facility]   Report Status 04/19/2014 FINAL   Final   Organism ID, Bacteria PSEUDOMONAS AERUGINOSA   Final    Medical History: Past Medical History  Diagnosis Date  . COPD (chronic obstructive pulmonary disease)   . Hyperlipidemia   . Type 2 diabetes mellitus   . Adult bronchiectasis   . Meatal stenosis   . History of atrial fibrillation without current medication     POST OP EPISODE 2004   . AAA (abdominal aortic aneurysm)     LAST DUPLEX ULTRASOUND 06-22-2010  3.7x3.6  . Productive cough   . Dyspnea on exertion   . GERD (gastroesophageal reflux disease)   . History of gastric ulcer   . Foot ulcer, left     BOTTOM OF LITTLE TOE , MONITORED BY PCP  DSG DAILY W/ ANTIBIOTIC OINTMENT AND EPSOM SALT SOAKS  . Arthritis   . Bursitis of shoulder     BOTH  . Frequency of urination   . Urgency  of urination   . Nocturia   . Wears glasses   . Bladder cancer     DX  OCT 2014 W/ INVASIVE HIGH GRADE UROTHELIAL BLADDER CARCINOMA (ONCOLOGIST--  DR Alen Blew)    Assessment: 80 yo M with SAH. Pharmacy consulted to dose ceftazidime in place of zosyn for Pseudomonas tracheobronchitis in the setting of diarrhea. WBC wnl at 5.7, creat 1.56.  Afebrile.   Day 6 of Abx >> change to fortaz 10/03  Sputum 9/28 >> Pseudomonas aerouginosa. - sens all abx tested  zoysn 9/29>>10/3 Ceftazidime 10/3>>  Urine cx 10/03 >>  C diff PCR 10/03 >>    Goal of Therapy:  Eradicate infection  Plan:  -ceftazidime 1 gm IV q12h  Eudelia Bunch, Pharm.D. [phone] 04/20/2014 2:31 PM

## 2014-04-21 ENCOUNTER — Inpatient Hospital Stay (HOSPITAL_COMMUNITY): Payer: Medicare HMO

## 2014-04-21 LAB — GLUCOSE, CAPILLARY
GLUCOSE-CAPILLARY: 132 mg/dL — AB (ref 70–99)
Glucose-Capillary: 144 mg/dL — ABNORMAL HIGH (ref 70–99)
Glucose-Capillary: 125 mg/dL — ABNORMAL HIGH (ref 70–99)
Glucose-Capillary: 135 mg/dL — ABNORMAL HIGH (ref 70–99)
Glucose-Capillary: 138 mg/dL — ABNORMAL HIGH (ref 70–99)
Glucose-Capillary: 139 mg/dL — ABNORMAL HIGH (ref 70–99)

## 2014-04-21 LAB — CBC
HCT: 22.8 % — ABNORMAL LOW (ref 39.0–52.0)
Hemoglobin: 7.4 g/dL — ABNORMAL LOW (ref 13.0–17.0)
MCH: 29 pg (ref 26.0–34.0)
MCHC: 32.5 g/dL (ref 30.0–36.0)
MCV: 89.4 fL (ref 78.0–100.0)
Platelets: 242 K/uL (ref 150–400)
RBC: 2.55 MIL/uL — ABNORMAL LOW (ref 4.22–5.81)
RDW: 15.7 % — ABNORMAL HIGH (ref 11.5–15.5)
WBC: 5.7 K/uL (ref 4.0–10.5)

## 2014-04-21 LAB — BASIC METABOLIC PANEL
Anion gap: 12 (ref 5–15)
BUN: 43 mg/dL — AB (ref 6–23)
CHLORIDE: 99 meq/L (ref 96–112)
CO2: 26 meq/L (ref 19–32)
CREATININE: 1.65 mg/dL — AB (ref 0.50–1.35)
Calcium: 8.9 mg/dL (ref 8.4–10.5)
GFR calc Af Amer: 42 mL/min — ABNORMAL LOW (ref >90)
GFR calc non Af Amer: 37 mL/min — ABNORMAL LOW (ref >90)
GLUCOSE: 119 mg/dL — AB (ref 70–99)
POTASSIUM: 4 meq/L (ref 3.7–5.3)
Sodium: 137 mEq/L (ref 137–147)

## 2014-04-21 LAB — URINE CULTURE
Colony Count: NO GROWTH
Culture: NO GROWTH

## 2014-04-21 LAB — CLOSTRIDIUM DIFFICILE BY PCR: Toxigenic C. Difficile by PCR: NEGATIVE

## 2014-04-21 MED ORDER — LOPERAMIDE HCL 1 MG/5ML PO LIQD
1.0000 mg | ORAL | Status: DC | PRN
  Administered 2014-04-21: 1 mg
  Filled 2014-04-21: qty 5

## 2014-04-21 NOTE — Progress Notes (Signed)
PULMONARY / CRITICAL CARE MEDICINE   Name: Patrick Lane MRN: [id number] DOB: 06/05/1928    ADMISSION DATE:  03/29/2014 CONSULTATION DATE:  03/22/2014  REFERRING MD :  EDP  CHIEF COMPLAINT:  SAH - Syncope  INITIAL PRESENTATION: 80 yo with fall and LOC >> found to have Lt SAH.  PCCM asked to admit.  SIGNIFICANT EVENTS: 9/28 admitted for SAH, likely 2nd to syncopal episode. Traumatic SAH noted 9/29 consulted neurosurgery, palliative care, urology 10/2 Palliative care meeting >> limited code >> no CPR/defibrillation  STUDIES: 9/28 CT head/neck >> Lt SAH 9/28 CT abd/pelvis >> increased size Lt bladder mass with mets to Lt adrenal, Lt hydronephrosis, 6.2 cm AAA 9/29 Echo >> EF 60 to 65%, grade 1 diastolic dysfx 9/29 Doppler legs >> no DVT  SUBJECTIVE: Tolerates some pressure support.  VITAL SIGNS: Temp:  [98.5 F (36.9 C)-100.4 F (38 C)] 98.5 F (36.9 C) (10/04 0500) Pulse Rate:  [28-102] 82 (10/04 0700) Resp:  [16-27] 16 (10/04 0700) BP: (112-168)/(47-75) 119/53 mmHg (10/04 0700) SpO2:  [96 %-100 %] 100 % (10/04 0700) FiO2 (%):  [40 %] 40 % (10/04 0700)  VENTILATOR SETTINGS: Vent Mode:  [-] PRVC FiO2 (%):  [40 %] 40 % Set Rate:  [16 bmp] 16 bmp Vt Set:  [640 mL] 640 mL PEEP:  [5 cmH20] 5 cmH20 Pressure Support:  [5 cmH20] 5 cmH20 Plateau Pressure:  [17 cmH20-18 cmH20] 17 cmH20  INTAKE / OUTPUT:  Intake/Output Summary (Last 24 hours) at 04/21/14 0747 Last data filed at 04/21/14 0700  Gross per 24 hour  Intake 1622.5 ml  Output   1480 ml  Net  142.5 ml   PHYSICAL EXAMINATION: General: no distress Neuro: follows commands, moves extremities HEENT: ETT, C collar in place Cardiovascular: irregular Lungs: b/l rhonchi Abdomen:  Soft, non tender Musculoskeletal: no edema Skin: no rashes  LABS: CBC Recent Labs     04/20/14  0300  04/21/14  0210  WBC  5.7  5.7  HGB  7.8*  7.4*  HCT  24.2*  22.8*  PLT  228  242    BMET Recent Labs     04/19/14  0237  04/20/14  0300  04/21/14  0210  NA  138  137  137  K  4.6  3.9  4.0  CL  102  101  99  CO2  23  23  26  BUN  32*  40*  43*  CREATININE  1.54*  1.56*  1.65*  GLUCOSE  134*  150*  119*    Electrolytes Recent Labs     04/19/14  0237  04/20/14  0300  04/21/14  0210  CALCIUM  8.5  8.8  8.9  MG   --   1.7   --   PHOS   --   2.4   --     Glucose Recent Labs     04/20/14  0822  04/20/14  1212  04/20/14  1648  04/20/14  2016  04/20/14  2341  04/21/14  0412  GLUCAP  127*  140*  137*  116*  134*  132*    Imaging Dg Chest Port 1 View  04/21/2014   CLINICAL DATA:  Assess endotracheal tube and other support tube positioning  EXAM: PORTABLE CHEST - 1 VIEW  COMPARISON:  Portable chest x-ray of April 20, 2014  FINDINGS: The lungs are adequately inflated. The medial aspect of the left hemidiaphragm remains obscured but the lateral aspect is more distinct today.   The cardiac silhouette is normal in size. The pulmonary vascularity is prominent centrally but stable. No significant pleural effusion is demonstrated. Known posttraumatic fractures of the posterior aspects of the fifth, sixth, and seventh ribs are visible.  The endotracheal tube tip lies 5.7 cm above the crotch of the carina. The esophagogastric tube tip in proximal port project below the level of the GE junction.  IMPRESSION: Positioning of the endotracheal tube and esophagogastric tube is radiographically good. There has been mild interval improvement in the appearance of the left lung base but lower lobe atelectasis medially persists. The interstitial markings of both lungs remain mildly increased.   Electronically Signed   By: David  Jordan   On: 04/21/2014 07:08   Dg Chest Port 1 View  04/20/2014   CLINICAL DATA:  Endotracheal tube. Admitted for subarachnoid hemorrhage with respiratory failure.  EXAM: PORTABLE CHEST - 1 VIEW  COMPARISON:  04/19/2014  FINDINGS: Endotracheal tube remains in place with tip at the level of the  clavicular heads, well above the carina. Enteric tube courses towards the left upper abdomen, incompletely imaged. Cardiac silhouette is incompletely imaged inferiorly but grossly unchanged. Bilateral hilar enlargement is stable to slightly less prominent than on the prior study. Patchy opacities remain in the lung bases. There is partial obscuration of the left hemidiaphragm, with hazy opacity in the left lung base potentially reflecting a small pleural effusion. No pneumothorax is identified.  IMPRESSION: 1. Persistent bibasilar atelectasis. Question small left pleural effusion. 2. Persistent bilateral hilar enlargement.   Electronically Signed   By: Allen  Grady   On: 04/20/2014 08:36     ASSESSMENT / PLAN:  PULMONARY OETT 9/29 >>  A: Acute hypoxic respiratory failrue 2nd to traumatic SAH. Hx of COPD. P:   Pressure support wean as tolerated >> defer extubation until mental status stable BDs F/u CXR intermittently  CARDIOVASCULAR A:  Hx of PAF, HLD. Hx of AAA - minimal change from prior CT. P:  Continue amiodarone  RENAL A:   Hx of bladder cancer with obstructive nephropathy >> urology recommending palliative care. P:   Monitor renal fx, urine outpt, electrolytes  GASTROINTESTINAL A:   Hx of GERD. Nutrition. P:   SUP: PPI Continue tube feeds while on vent  HEMATOLOGIC A:   Anemia, chronic and due to critical illness. P:  DVT px: SCDs Monitor CBC intermittently  INFECTIOUS A:   Pseudomonas tracheobronchitis. Diarrhea. P:   Day 7/10 of Abx >> changed to fortaz 10/03  Sputum 9/28 >> Pseudomonas aerouginosa. Urine cx 10/03 >> C diff PCR 10/03 >>   ENDOCRINE A:   DM. P:   SSI  NEUROLOGIC A:   Syncope with traumatic SAH. Seizures. P:   RASS goal 0 AED's per neurosurgery  Family updates:  9/28 Updated grandson >> agreed to DNR status, but continue medical care  Interdisciplinary Family Meeting v Palliative Care Meeting:  Palliative care met with  family 10/02  CC time 35 minutes.  Vineet Sood, MD [facility] 04/21/2014, 7:47 AM Pager:  [phone] After 3pm call: [phone]    

## 2014-04-21 NOTE — Progress Notes (Signed)
Patient ID: Patrick Lane, male   DOB: 02-Sep-1927, 80 y.o.   MRN: [id number] No change in neurologic exam. Continue current management

## 2014-04-22 DIAGNOSIS — E119 Type 2 diabetes mellitus without complications: Secondary | ICD-10-CM

## 2014-04-22 DIAGNOSIS — Z7189 Other specified counseling: Secondary | ICD-10-CM

## 2014-04-22 LAB — BASIC METABOLIC PANEL
Anion gap: 11 (ref 5–15)
BUN: 43 mg/dL — AB (ref 6–23)
CO2: 27 mEq/L (ref 19–32)
Calcium: 8.9 mg/dL (ref 8.4–10.5)
Chloride: 101 mEq/L (ref 96–112)
Creatinine, Ser: 1.65 mg/dL — ABNORMAL HIGH (ref 0.50–1.35)
GFR calc Af Amer: 42 mL/min — ABNORMAL LOW (ref >90)
GFR, EST NON AFRICAN AMERICAN: 37 mL/min — AB (ref >90)
Glucose, Bld: 125 mg/dL — ABNORMAL HIGH (ref 70–99)
POTASSIUM: 3.7 meq/L (ref 3.7–5.3)
Sodium: 139 mEq/L (ref 137–147)

## 2014-04-22 LAB — GLUCOSE, CAPILLARY
GLUCOSE-CAPILLARY: 136 mg/dL — AB (ref 70–99)
GLUCOSE-CAPILLARY: 119 mg/dL — AB (ref 70–99)
GLUCOSE-CAPILLARY: 136 mg/dL — AB (ref 70–99)
Glucose-Capillary: 138 mg/dL — ABNORMAL HIGH (ref 70–99)
Glucose-Capillary: 129 mg/dL — ABNORMAL HIGH (ref 70–99)
Glucose-Capillary: 114 mg/dL — ABNORMAL HIGH (ref 70–99)

## 2014-04-22 LAB — CBC
HCT: 22.7 % — ABNORMAL LOW (ref 39.0–52.0)
HEMOGLOBIN: 7.2 g/dL — AB (ref 13.0–17.0)
MCH: 28.6 pg (ref 26.0–34.0)
MCHC: 31.7 g/dL (ref 30.0–36.0)
MCV: 90.1 fL (ref 78.0–100.0)
PLATELETS: 265 10*3/uL (ref 150–400)
RBC: 2.52 MIL/uL — AB (ref 4.22–5.81)
RDW: 15.8 % — ABNORMAL HIGH (ref 11.5–15.5)
WBC: 5.8 10*3/uL (ref 4.0–10.5)

## 2014-04-22 MED ORDER — FENTANYL CITRATE 0.05 MG/ML IJ SOLN
12.5000 ug | INTRAMUSCULAR | Status: DC | PRN
  Administered 2014-04-22: 25 ug via INTRAVENOUS
  Filled 2014-04-22: qty 2

## 2014-04-22 MED ORDER — LEVETIRACETAM IN NACL 1000 MG/100ML IV SOLN
1000.0000 mg | Freq: Two times a day (BID) | INTRAVENOUS | Status: DC
Start: 2014-04-22 — End: 2014-04-24
  Administered 2014-04-22 – 2014-04-23 (×3): 1000 mg via INTRAVENOUS
  Filled 2014-04-22 (×4): qty 100

## 2014-04-22 MED ORDER — FENTANYL CITRATE 0.05 MG/ML IJ SOLN: 12.5000 ug | INTRAMUSCULAR | Status: DC | PRN

## 2014-04-22 MED ORDER — FUROSEMIDE 10 MG/ML IJ SOLN
INTRAMUSCULAR | Status: AC
  Filled 2014-04-22: qty 4

## 2014-04-22 MED ORDER — MORPHINE SULFATE 2 MG/ML IJ SOLN
INTRAMUSCULAR | Status: AC
  Filled 2014-04-22: qty 1

## 2014-04-22 MED ORDER — MORPHINE SULFATE 2 MG/ML IJ SOLN
2.0000 mg | Freq: Once | INTRAMUSCULAR | Status: AC
Start: 2014-04-22 — End: 2014-04-22
  Administered 2014-04-22: 2 mg via INTRAVENOUS

## 2014-04-22 MED ORDER — MORPHINE SULFATE 2 MG/ML IJ SOLN
2.0000 mg | INTRAMUSCULAR | Status: DC | PRN
  Administered 2014-04-22 – 2014-04-24 (×13): 2 mg via INTRAVENOUS
  Filled 2014-04-22 (×13): qty 1

## 2014-04-22 MED ORDER — MORPHINE SULFATE 2 MG/ML IJ SOLN
2.0000 mg | Freq: Once | INTRAMUSCULAR | Status: AC
  Administered 2014-04-22: 2 mg via INTRAVENOUS

## 2014-04-22 MED ORDER — KCL IN DEXTROSE-NACL 20-5-0.45 MEQ/L-%-% IV SOLN
INTRAVENOUS | Status: DC
Start: 2014-04-22 — End: 2014-04-24
  Administered 2014-04-22: 75 mL/h via INTRAVENOUS
  Administered 2014-04-23: 12:00:00 via INTRAVENOUS
  Filled 2014-04-22 (×4): qty 1000

## 2014-04-22 MED ORDER — FUROSEMIDE 10 MG/ML IJ SOLN: 40.0000 mg | Freq: Once | INTRAMUSCULAR | Status: DC

## 2014-04-22 MED ORDER — FUROSEMIDE 10 MG/ML IJ SOLN
40.0000 mg | Freq: Once | INTRAMUSCULAR | Status: AC
  Administered 2014-04-22: 40 mg via INTRAVENOUS

## 2014-04-22 NOTE — Progress Notes (Signed)
150 mL of fentanyl wasted in sink. Witnessed by Holland Commons, RN.

## 2014-04-22 NOTE — Procedures (Signed)
Extubation Procedure Note  Patient Details:   Name: Patrick Lane DOB: 1928/07/15 MRN: [id number]   Airway Documentation:  Airway 8 mm (Active)  Secured at (cm) 22 cm 04/22/2014  7:45 AM  Measured From Lips 04/22/2014  7:45 AM  Secured Location Left 04/22/2014  7:45 AM  Secured By [employer] 04/22/2014  7:45 AM  Tube Holder Repositioned Yes 04/22/2014  7:45 AM  Cuff Pressure (cm H2O) 26 cm H2O 04/22/2014  3:40 AM  Site Condition Dry 04/21/2014  4:21 AM    Evaluation  O2 sats: stable throughout and currently acceptable Complications: No apparent complications Patient did tolerate procedure well. Bilateral Breath Sounds: Clear;Diminished Suctioning: Airway No- Pt is unable to phonate post-extubation.  Dr. Alva Garnet is aware.  Prior to extubation: Pt suctioned orally, via subglottic and ETT tube.  Positive cuff leak noted.  Post-extubation:  Pt able to cough to produce sputum, no stridor noted.  Miquel Dunn 04/22/2014, 10:51 AM

## 2014-04-22 NOTE — Progress Notes (Signed)
Patient ID: Patrick Lane, male   DOB: 1928/03/09, 80 y.o.   MRN: [id number] Subjective:  The patient is alert and nods appropriately. He is in no apparent distress.  Objective: Vital signs in last 24 hours: Temp:  [97.6 F (36.4 C)-100.2 F (37.9 C)] 98.1 F (36.7 C) (10/05 0749) Pulse Rate:  [72-97] 78 (10/05 0800) Resp:  [16-25] 20 (10/05 0800) BP: (106-159)/(46-62) 136/52 mmHg (10/05 0800) SpO2:  [97 %-100 %] 100 % (10/05 0800) FiO2 (%):  [40 %] 40 % (10/05 0745)  Intake/Output from previous day: 10/04 0701 - 10/05 0700 In: 2355 [I.V.:115; NG/GT:1440; IV Piggyback:100] Out: 7322 [Urine:1485] Intake/Output this shift: Total I/O In: 65 [I.V.:5; NG/GT:60] Out: -   Physical exam Glasgow Coma Scale 11 intubated. He follows commands and moves all 4 extremities.  Lab Results:  Recent Labs  04/21/14 0210 04/22/14 0237  WBC 5.7 5.8  HGB 7.4* 7.2*  HCT 22.8* 22.7*  PLT 242 265   BMET  Recent Labs  04/21/14 0210 04/22/14 0237  NA 137 139  K 4.0 3.7  CL 99 101  CO2 26 27  GLUCOSE 119* 125*  BUN 43* 43*  CREATININE 1.65* 1.65*  CALCIUM 8.9 8.9    Studies/Results: Dg Chest Port 1 View  04/21/2014   CLINICAL DATA:  Assess endotracheal tube and other support tube positioning  EXAM: PORTABLE CHEST - 1 VIEW  COMPARISON:  Portable chest x-ray of April 20, 2014  FINDINGS: The lungs are adequately inflated. The medial aspect of the left hemidiaphragm remains obscured but the lateral aspect is more distinct today. The cardiac silhouette is normal in size. The pulmonary vascularity is prominent centrally but stable. No significant pleural effusion is demonstrated. Known posttraumatic fractures of the posterior aspects of the fifth, sixth, and seventh ribs are visible.  The endotracheal tube tip lies 5.7 cm above the crotch of the carina. The esophagogastric tube tip in proximal port project below the level of the GE junction.  IMPRESSION: Positioning of the endotracheal tube  and esophagogastric tube is radiographically good. There has been mild interval improvement in the appearance of the left lung base but lower lobe atelectasis medially persists. The interstitial markings of both lungs remain mildly increased.   Electronically Signed   By: David  Martinique   On: 04/21/2014 07:08    Assessment/Plan: Traumatic subarachnoid hemorrhage, subdural hematoma: The patient is doing well neurologically. He has multiple medical issues. I will sign off. Please call if I can be of further assistance.  LOS: 7 days     Shireen Rayburn D 04/22/2014, 9:43 AM

## 2014-04-22 NOTE — Progress Notes (Signed)
Chaplain initiated follow up with pt and family. Chaplain reintroduced herself to pt who was recently extubated. Chaplain will continue to follow.   04/22/14 1100  Clinical Encounter Type  Visited With Patient and family together  Visit Type Follow-up  Tyra Gural, Barbette Hair, Chaplain 04/22/2014 11:08 AM

## 2014-04-22 NOTE — Progress Notes (Signed)
Pt having increased WOB, diaphoretic, high respirations 30-36, oxygenating at 95% on 10L non rebreather.  Pt c/o pain that is not relieved with 2mg  morphine each hour over the last 2 hours.   Reginal Lutes MD, Dr. Joya Gaskins to make aware. Called Respiratory to come take a look to see if any bedside adjustments can be made to avoid re-intubation.   Sorayah Schrodt GARNER

## 2014-04-22 NOTE — Progress Notes (Signed)
PULMONARY / CRITICAL CARE MEDICINE   Name: GEMAYEL MASCIO MRN: [id number] DOB: Nov 18, 1927    ADMISSION DATE:  04/02/2014 CONSULTATION DATE:  03/28/2014  REFERRING MD :  EDP  CHIEF COMPLAINT:  SAH - Syncope  INITIAL PRESENTATION: 80 yo with fall and LOC >> found to have Lt SAH.  PCCM asked to admit.  SIGNIFICANT EVENTS: 9/28 admitted for [facility], likely 2nd to syncopal episode. Traumatic SAH noted 9/29 consulted neurosurgery, palliative care, urology 10/2 Palliative care meeting >> limited code >> no CPR/defibrillation 10/05 Passed SBT. RASS 0. + F/C. Coughed on deep suctioning. Mild mucoid secretions. Extubated and initially tolerated. Then progressive hypoxemia and resp distress due to inability to cough and clear airway effectively. Minimal gag reflex. Lengthy discussion with godson who has HCPOA. Strongly urged that we consider a more palliative course  STUDIES: 9/28 CT head/neck >> Lt [facility] 9/28 CT abd/pelvis >> increased size Lt bladder mass with mets to Lt adrenal, Lt hydronephrosis, 6.2 cm AAA 9/29 Echo >> EF 60 to 39%, grade 1 diastolic dysfx 7/67 Doppler legs >> no DVT  SUBJECTIVE: Passed SBT. RASS 0. + F/C. Coughed on deep suctioning. Mild mucoid secretions. Extubated and initially tolerated. Then progressive hypoxemia and resp distress due to inability to cough and clear airway effectively. Minimal gag reflex. Lengthy discussion with godson who has HCPOA. Strongly urged that we consider a more palliative course  VITAL SIGNS: Temp:  [97.6 F (36.4 C)-99.2 F (37.3 C)] 99.2 F (37.3 C) (10/05 1539) Pulse Rate:  [72-113] 107 (10/05 1500) Resp:  [16-37] 24 (10/05 1500) BP: (106-191)/(46-79) 144/49 mmHg (10/05 1500) SpO2:  [87 %-100 %] 100 % (10/05 1500) FiO2 (%):  [40 %] 40 % (10/05 0745)  VENTILATOR SETTINGS: Vent Mode:  [-] PSV;CPAP FiO2 (%):  [40 %] 40 % Set Rate:  [16 bmp] 16 bmp Vt Set:  [640 mL] 640 mL PEEP:  [5 cmH20] 5 cmH20 Pressure Support:  [10 cmH20] 10  cmH20 Plateau Pressure:  [18 cmH20-20 cmH20] 19 cmH20  INTAKE / OUTPUT:  Intake/Output Summary (Last 24 hours) at 04/22/14 1628 Last data filed at 04/22/14 1600  Gross per 24 hour  Intake   1420 ml  Output    685 ml  Net    735 ml   PHYSICAL EXAMINATION: General: no distress Neuro: follows commands, moves extremities HEENT: edentulous, no overt trauma noted Cardiovascular: reg, no M Lungs: b/l rhonchi Abdomen:  Soft, non tender Musculoskeletal: no edema Skin: no rashes  LABS: I have reviewed all of today's lab results. Relevant abnormalities are discussed in the A/P section   ASSESSMENT / PLAN:  PULMONARY OETT 9/29 >> 10/05 A: Acute hypoxic respiratory failrue 2nd to traumatic SAH. Hx of COPD. High risk of re-intubation P:   Supp O2 to maintain SpO2 > 90 % NTS PRN PRN Nebs  CARDIOVASCULAR A:  PAF > sinus. Hx of AAA - minimal change from prior CT. P:  Holding amiodarone  RENAL A:   Bladder cancer with obstructive nephropathy >> urology recommending palliative care. P:   Monitor BMET intermittently Monitor I/Os Correct electrolytes as indicated Cont Foley cath drainage Maintenance IVFs post extubation  GASTROINTESTINAL A:   Hx of GERD. P:   SUP: N/I post extubation NPO post extubation  HEMATOLOGIC A:   AOCD Anemia of critical illness P:  DVT px: SCDs Monitor CBC intermittently Transfuse per usual ICU guidelines Minimize phlebotomy  INFECTIOUS A:   Pseudomonas tracheobronchitis. Diarrhea. P:   Day 8/10 of Abx  Sputum  9/28 >> Pseudomonas aerouginosa. Urine cx 10/03 >> NEG C diff PCR 10/03 >> Neg  ENDOCRINE A:   DM 2 P:   Cont SSI  NEUROLOGIC A:   Syncope with traumatic SAH/SDH. Seizures, controlled P:   RASS goal 0 AED's per neurosurgery  Family updates:  10/05 Updated god son and nephew  Interdisciplinary Family Meeting v Palliative Care Meeting:  Palliative care to meet again with [facility] today  CC time 45  minutes.  Merton Border, MD ; [facility] [phone].  After 5:30 PM or weekends, call [phone]

## 2014-04-23 LAB — GLUCOSE, CAPILLARY
GLUCOSE-CAPILLARY: 112 mg/dL — AB (ref 70–99)
GLUCOSE-CAPILLARY: 109 mg/dL — AB (ref 70–99)
Glucose-Capillary: 131 mg/dL — ABNORMAL HIGH (ref 70–99)
Glucose-Capillary: 121 mg/dL — ABNORMAL HIGH (ref 70–99)
Glucose-Capillary: 138 mg/dL — ABNORMAL HIGH (ref 70–99)
Glucose-Capillary: 133 mg/dL — ABNORMAL HIGH (ref 70–99)

## 2014-04-23 NOTE — Progress Notes (Signed)
ANTIBIOTIC CONSULT NOTE - Follow Up  Pharmacy Consult for ceftazidime Indication: Pseudomonas tracheobronchitis   Allergies  Allergen Reactions  . Celebrex [Celecoxib] Hives    Patient Measurements: Height: 6\' 1"  (185.4 cm) Weight: 161 lb 9.6 oz (73.3 kg) IBW/kg (Calculated) : 79.9   Vital Signs: Temp: 98.2 F (36.8 C) (10/06 0755) Temp Source: Axillary (10/06 0755) BP: 134/54 mmHg (10/06 0900) Pulse Rate: 90 (10/06 0900) Intake/Output from previous day: 10/05 0701 - 10/06 0700 In: 3092.5 [I.V.:732.5; NG/GT:180; IV Piggyback:200] Out: 1270 [Urine:1270] Intake/Output from this shift: Total I/O In: -  Out: 200 [Urine:200]  Labs:  Recent Labs  04/21/14 0210 04/22/14 0237  WBC 5.7 5.8  HGB 7.4* 7.2*  PLT 242 265  CREATININE 1.65* 1.65*   Estimated Creatinine Clearance: 34.6 ml/min (by C-G formula based on Cr of 1.65). No results found for this basename: VANCOTROUGH, VANCOPEAK, VANCORANDOM, GENTTROUGH, GENTPEAK, GENTRANDOM, TOBRATROUGH, TOBRAPEAK, TOBRARND, AMIKACINPEAK, AMIKACINTROU, AMIKACIN,  in the last 72 hours    Assessment: 80 yo M with SAH on ceftazidime for Pseudomonas tracheobronchitis pneumonia. Today is D#9/10 of antibiotic therapy. Renal fx remains stable. Pt is afebrile and WBC is wnl.     zoysn 9/29>>10/3 Ceftazidime 10/3>>  Urine cx 10/03 >>  C diff PCR 10/03 >>  Trach aspirate 9/28 >> pseudomonas (sens to ceftaz)    Goal of Therapy:  Eradicate infection  Plan:  -Continue ceftazidime 1 gm IV q12h  Albertina Parr, PharmD.  Clinical Pharmacist Pager [phone]

## 2014-04-23 NOTE — Progress Notes (Signed)
PULMONARY / CRITICAL CARE MEDICINE   Name: Patrick Lane MRN: [id number] DOB: 05-15-1928    ADMISSION DATE:  03/25/2014 CONSULTATION DATE:  04/06/2014  REFERRING MD :  EDP  CHIEF COMPLAINT:  SAH - Syncope  INITIAL PRESENTATION: 80 yo with fall and LOC >> found to have L SAH.  PCCM asked to admit.  SIGNIFICANT EVENTS: 9/28 admitted for [facility], likely 2nd to syncopal episode. Traumatic SAH noted 9/29 consulted neurosurgery, palliative care, urology 10/2 Palliative care meeting >> limited code >> no CPR/defibrillation 10/05 Passed SBT. RASS 0. + F/C. Coughed on deep suctioning. Mild mucoid secretions. Extubated and initially tolerated. Then progressive hypoxemia and resp distress due to inability to cough and clear airway effectively. Minimal gag reflex. Lengthy discussion with godson who has HCPOA. Strongly urged that we consider a more palliative course 10/06 Intermittent episodes of dyspnea due to secretions in UA with poor cough reflex and mechanics. On NRB mask with SpO2 in 90s. Palliative Care to meet with godson again today  STUDIES: 9/28 CT head: Extensive subarachnoid hemorrhage throughout the left calvarium, most pronounced in the left temporal lobe with foci also present in the frontal and parietal lobes on the left. There is a small subdural hematoma on the left measuring 5 mm in maximal thickness. There is a small tentorial subdural hematoma on the left as well. No midline shift. Underlying atrophy with small vessel disease. A prior lacunar type infarct in the left medial temporal lobe currently contains a focus of hemorrhage 9/28 CT neck: Multifocal osteoarthritic change. No fracture or spondylolisthesis. Calcification in both carotid arteries 9/28 CT abd/pelvis: increased size L bladder mass with mets to L adrenal, L hydronephrosis, 6.2 cm AAA 9/29 Echo:  EF 60 to 53%, grade 1 diastolic dysfx 6/64 Doppler legs:  no DVT 9/29 CT head: Acute left subdural, subarachnoid, and  intraparenchymal hemorrhage demonstrating some progression since previous study   SUBJECTIVE: Presently RASS -2. Receiving MSO4 for comfort. Not F/C presently but was doing so yesterday. No overt distress presently  VITAL SIGNS: Temp:  [98.2 F (36.8 C)-99.2 F (37.3 C)] 98.2 F (36.8 C) (10/06 0755) Pulse Rate:  [90-113] 90 (10/06 0900) Resp:  [18-37] 21 (10/06 0900) BP: (117-191)/(43-79) 134/54 mmHg (10/06 0900) SpO2:  [87 %-100 %] 100 % (10/06 0900) Weight:  [73.3 kg (161 lb 9.6 oz)] 73.3 kg (161 lb 9.6 oz) (10/06 0253)  VENTILATOR SETTINGS:    INTAKE / OUTPUT:  Intake/Output Summary (Last 24 hours) at 04/23/14 1104 Last data filed at 04/23/14 0908  Gross per 24 hour  Intake 2332.5 ml  Output   1470 ml  Net  862.5 ml   PHYSICAL EXAMINATION: General: no distress Neuro: MAEs HEENT: edentulous Cardiovascular: reg, no M Lungs: b/l rhonchi Abdomen:  Soft, non tender Musculoskeletal: no edema Skin: no rashes  LABS: I have reviewed all of today's lab results. Relevant abnormalities are discussed in the A/P section  CXR: NNF  ASSESSMENT / PLAN:  PULMONARY OETT 9/29 >> 10/05 A: Acute hypoxic respiratory failrue 2nd to traumatic SAH. Hx of COPD. Poor airway protection, diminished gag and cough High risk of re-intubation - poor candidate for this P:   Supp O2 to maintain SpO2 > 90 % NTS PRN PRN Nebs Cont EOL discussions  CARDIOVASCULAR A:  PAF > sinus. Hx of AAA - minimal change from prior CT. P:  Holding amiodarone  RENAL A:   Bladder cancer with obstructive nephropathy >> urology recommending palliative care. P:   Monitor BMET intermittently  Monitor I/Os Correct electrolytes as indicated Cont Foley cath drainage Maintenance IVFs post extubation  GASTROINTESTINAL A:   Hx of GERD Dysphagia P:   SUP: N/I post extubation Cont NPO   HEMATOLOGIC A:   AOCD Anemia of critical illness P:  DVT px: SCDs Monitor CBC intermittently Transfuse per  usual ICU guidelines Minimize phlebotomy  INFECTIOUS A:   Pseudomonas tracheobronchitis. Diarrhea. P:   Day 9/10 of Abx  Sputum 9/28 >> Pseudomonas aerouginosa. Urine cx 10/03 >> NEG C diff PCR 10/03 >> Neg  ENDOCRINE A:   DM 2, controlled P:   Cont SSI  NEUROLOGIC A:   Syncope with traumatic SAH/SDH. Seizures, controlled P:   RASS goal 0 Cont current anti-convulsants  Family updates:  10/05 Updated nephew  Interdisciplinary Family Meeting v Palliative Care Meeting:  Palliative care to meet again with [facility] today  CC time 30 minutes.  Merton Border, MD ; [facility] [phone].  After 5:30 PM or weekends, call [phone]

## 2014-04-23 NOTE — Progress Notes (Addendum)
  Critical care team working with family regarding pulmonary status as it relates to ventilator support.   This NP has been involved with this family during this hospital stay helping them navigate treatment options and advanced care decisions.  I  contacted Legrand Como Melton/ HPO[employer]  by telephone to offer continued holistic support.    We discussed current medical situation, diagnosis, prognosis, GOC and anticipatory care needs.  We discussed in detail the fragility of the patient and  the importance of clarifying decision related to re-intubation, in order to enhance patient centered care.  Although he is stable at the moment it is a tenuous situation and a rapid pulmonary decompensation would not be unexpected.    We revisited the fact that the patient's advanced directive requested no extraordinary measures in a terminal/incurabe situation.    It was difficult for Legrand Como but he was able to verbalize his decision for no further intubation and that he understands the limited prognosis.  In previous conversations family expressed strong interest in  "getting the patient home to die".  I asked Legrand Como if he would like to discuss this as a  possibility and he agrees to meet me in the morning at 7 am for further discussion.  Discussed with Dr Bosie Clos NP  Palliative Medicine Team Team Phone # [phone] Pager [phone]

## 2014-04-23 NOTE — Progress Notes (Signed)
NUTRITION FOLLOW-UP  Pt meets criteria for SEVERE MALNUTRITION in the context of chronic illness as evidenced by 23% weight loss x 3 months and severe muscle depletion.  DOCUMENTATION CODES Per approved criteria  -Severe malnutrition in the context of chronic illness   INTERVENTION: Supplement diet once advanced.   NUTRITION DIAGNOSIS: Inadequate oral intake related to inability to eat as evidenced by NPO status; ongoing.   Goal: Pt to meet >/= 90% of their estimated nutrition needs; not met.   Monitor:  Goals of care, weight trends, labs  ASSESSMENT: History includes bladder Ca (s/p chemo, radiation. Last therapy 2-3 months ago). Pt lives at home with live-in helper.  Pt admitted with acute traumatic SAH, intubated 9/29.   Family meeting with Palliative care again 10/6.   Pt extubated 10/5 and currently remains NPO, family deciding on goals of care.  Pt on supplemental potassium.   Height: Ht Readings from Last 1 Encounters:  04/16/14 $RemoveB'6\' 1"'oRBqeABm$  (1.854 m)    Weight: Wt Readings from Last 1 Encounters:  04/23/14 161 lb 9.6 oz (73.3 kg)  Weight range 156-169 lb  BMI:  Body mass index is 21.32 kg/(m^2).  Estimated Nutritional Needs: Kcal: 1700-1900 Protein: 75-85 grams  Fluid: > 1.7 L/day  Skin: intact  Diet Order: NPO   Intake/Output Summary (Last 24 hours) at 04/23/14 1432 Last data filed at 04/23/14 0908  Gross per 24 hour  Intake 2087.5 ml  Output   1470 ml  Net  617.5 ml    Last BM: 10/6   Labs:   Recent Labs Lab 04/17/14 0322 04/18/14 0300  04/20/14 0300 04/21/14 0210 04/22/14 0237  NA 137 137  < > 137 137 139  K 3.9 3.1*  < > 3.9 4.0 3.7  CL 99 99  < > 101 99 101  CO2 22 23  < > $R'23 26 27  'IH$ BUN 27* 29*  < > 40* 43* 43*  CREATININE 1.68* 1.61*  < > 1.56* 1.65* 1.65*  CALCIUM 8.8 8.4  < > 8.8 8.9 8.9  MG 1.7 1.9  --  1.7  --   --   PHOS 4.1 3.0  --  2.4  --   --   GLUCOSE 203* 158*  < > 150* 119* 125*  < > = values in this interval not  displayed.  CBG (last 3)   Recent Labs  04/23/14 0256 04/23/14 0754 04/23/14 1132  GLUCAP 131* 121* 112*    Scheduled Meds: . antiseptic oral rinse  7 mL Mouth Rinse QID  . cefTAZidime (FORTAZ)  IV  1 g Intravenous Q12H  . chlorhexidine  15 mL Mouth Rinse BID  . insulin aspart  0-9 Units Subcutaneous 6 times per day  . levETIRAcetam  1,000 mg Intravenous Q12H    Continuous Infusions: . dextrose 5 % and 0.45 % NaCl with KCl 20 mEq/L 75 mL/hr at 04/23/14 [place], [place], CNSC [phone] Pager [phone] After Hours Pager

## 2014-04-24 LAB — GLUCOSE, CAPILLARY: GLUCOSE-CAPILLARY: 163 mg/dL — AB (ref 70–99)

## 2014-05-16 NOTE — Discharge Summary (Addendum)
DEATH SUMMARY  DATE OF ADMISSION:  16-Apr-2014  DATE OF DISCHARGE/DEATH:  Apr 25, 2014  ADMISSION DIAGNOSES:   Metastatic bladder cancer   Syncope Acute traumatic SAH  Hypertensive emergency  Acute respiratory failure  H/o COPD  PAF  H/O AAA, no evidence of expansion or rupture AKI H/o GERD  Anemia, chronic  Leukocytosis  DM 2   DISCHARGE DIAGNOSES:   Metastatic bladder cancer with obstructive uropathy  Syncope Acute traumatic SAH  Seizures Hypertensive emergency  Acute respiratory failure, recurrent - due to AMS and poor cough mechanics H/o COPD  PAF  H/O AAA, no evidence of expansion or rupture AKI H/o GERD  Dysphagia Anemia, chronic disease Leukocytosis  Pseudomonas tracheobronchitis Diarrhea DM 2 Protein-calorie malnutrition  PRESENTATION:   Pt was admitted with the following HPI and the above admission diagnoses:  HISTORY OF PRESENT ILLNESS: 80 year old male with PHM as outlined below, which includes bladder Ca (s/p chemo, radiation. Last therapy 2-3 months ago), AAA, DM, PAF. He has been living at home with a live in helper. Family reports they were told his cancer was gone and that he is pretty functional. Still drives, cooks sometimes, etc. 04-17-2023 he was at the store with his helper. He had an unwitnessed fall with subsequent loss of consciousness. He was brought to ED where his mental status was altered. A CT scan of his head shows extensive Left SAH. He was seen by neurosurgery in ED and they do not feel that surgical intervention is warranted at this time. They recommended close monitoring in ICU.    HOSPITAL COURSE:    Admitted to PCCM service with neurosurgery consultation  SIGNIFICANT EVENTS:  04-17-2023 admitted for [facility], likely 2nd to syncopal episode. Traumatic SAH noted  9/29 consulted neurosurgery, palliative care, urology  10/2 Palliative care meeting >> limited code >> no CPR/defibrillation  10/05 Passed SBT. RASS 0. + F/C. Coughed on deep suctioning. Mild  mucoid secretions. Extubated and initially tolerated. Then progressive hypoxemia and resp distress due to inability to cough and clear airway effectively. Minimal gag reflex. Lengthy discussion with godson who has HCPOA. Strongly urged that we consider a more palliative course  10/06 Intermittent episodes of dyspnea due to secretions in UA with poor cough reflex and mechanics. On NRB mask with SpO2 in 90s. Palliative Care again spoke with god son.  Planned to meet face to face 04/26/23 AM 2023/04/26 early AM: progressive dyspnea and hypoxemia. Several attempts to contact god son were unsuccessful. Pt was noted to be DNR and palliation was provided. Passed away peacefully shortly thereafter. No autopsy was performed  STUDIES:  17-Apr-2023 CT head: Extensive subarachnoid hemorrhage throughout the left calvarium, most pronounced in the left temporal lobe with foci also present in the frontal and parietal lobes on the left. There is a small subdural hematoma on the left measuring 5 mm in maximal thickness. There is a small tentorial subdural hematoma on the left as well. No midline shift. Underlying atrophy with small vessel disease. A prior lacunar type infarct in the left medial temporal lobe currently contains a focus of hemorrhage  April 17, 2023 CT neck: Multifocal osteoarthritic change. No fracture or spondylolisthesis. Calcification in both carotid arteries  Apr 17, 2023 CT abd/pelvis: increased size L bladder mass with mets to L adrenal, L hydronephrosis, 6.2 cm AAA  9/29 Echo: EF 60 to 88%, grade 1 diastolic dysfx  3/25 Doppler legs: no DVT  9/29 CT head: Acute left subdural, subarachnoid, and intraparenchymal hemorrhage demonstrating some progression since previous study  LINES/TUBES/DEVICES:  ETT 9/29 >> 10/05  Cause of death:  Subarachnoid hemorrhage  Contributing factors: Acute respiratory failure Metastatic bladder cancer   Merton Border, MD;  PCCM service; Mobile [phone]

## 2014-05-19 NOTE — Progress Notes (Signed)
3:10 Pt experiencing increased WOB and began desaturating, called [facility] to inform them of decline in respiratory status.  Pt is a Full DNR. 03:15 Called Pt contact: Barth Kirks. [phone] Jonathon Jordan) to inform him of pt rapidly declining status  03:45 Patient passed with 4 staff members at the bedside.  03:48 Called Barth Kirks. 2nd time, Left VM about there being a status change in pt. 03:49 Called Juliann Pulse on pt contact list, Left VM about change in status of the pt.  05:10 Called Barth Kirks. 3rd time, Left VM to call the unit.  05:30 Pt transferred to [facility] check list is incomplete due to being unable to get in touch with pt contacts.  All pt belongings (denture in denture cup, cards) are with pt in the morge.  I have received no return phone calls from either contact at this time. Courtnie Brenes GARNER

## 2014-05-19 DEATH — deceased

## 2014-05-21 ENCOUNTER — Ambulatory Visit (HOSPITAL_COMMUNITY): Admission: RE | Admit: 2014-05-21 | Payer: Commercial Managed Care - HMO | Source: Ambulatory Visit

## 2014-05-21 ENCOUNTER — Other Ambulatory Visit: Payer: Commercial Managed Care - HMO

## 2014-05-23 ENCOUNTER — Ambulatory Visit: Payer: Commercial Managed Care - HMO | Admitting: Oncology

## 2015-10-23 IMAGING — CR DG CHEST 1V PORT
1 series · 1 of 1 positions shown · non-contrast
Comparison: DG CHEST 2 VIEW dated 05/14/2013;

CLINICAL DATA: Shortness of breath.

EXAM:
PORTABLE CHEST - 1 VIEW

[AP]
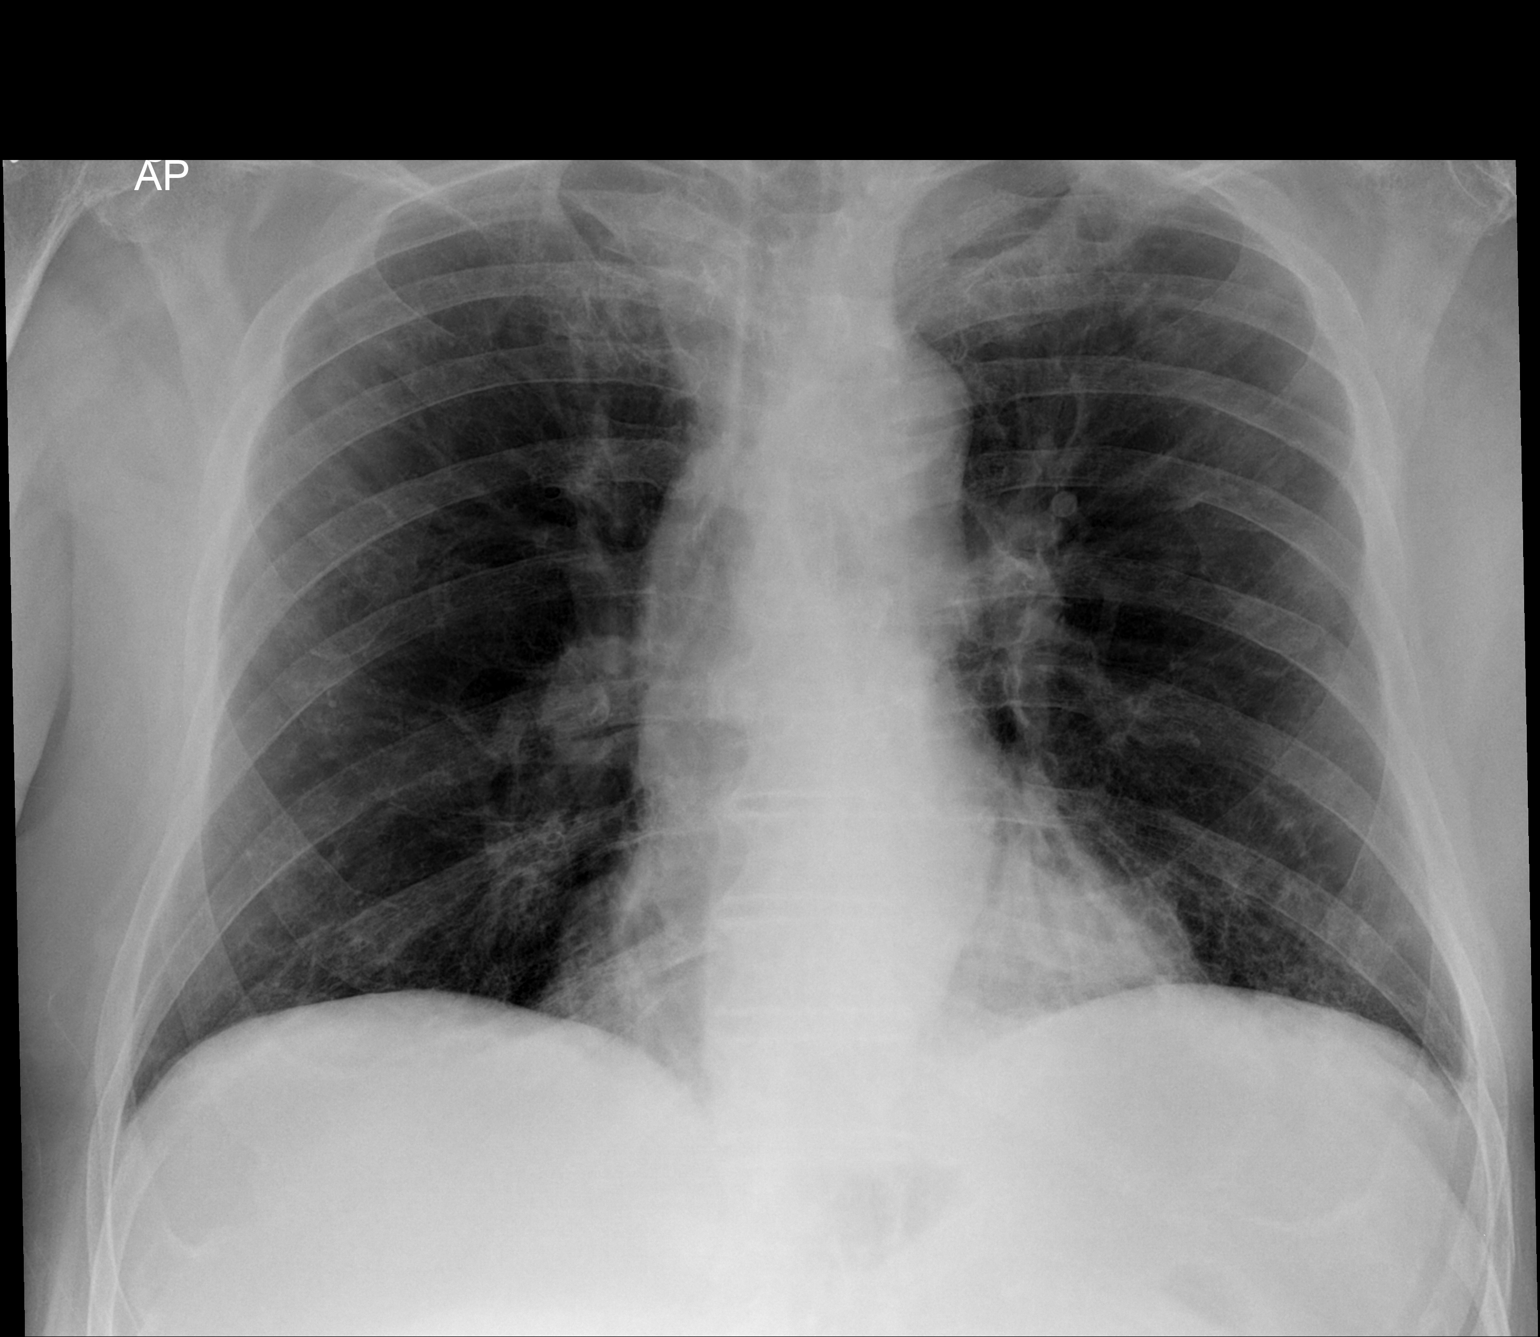

[1 of 1 positions shown; findings below may reference images not displayed]

DG CHEST 2V dated
05/24/2012; CT CHEST W/CM dated 12/07/2002; CT ANGIO CHEST dated
03/05/2005
FINDINGS: Ill-defined nodularity in both upper lobes the. Linear reticular
opacities at the lung bases bilaterally. Possible emphysema.

No paratracheal adenopathy observed. Atherosclerotic aortic arch.
Subtly blunted left lateral costophrenic angle.
IMPRESSION: 1. Vague nodularity in the upper lobes. I cannot exclude neoplastic
pulmonary nodules although much of this may be due to superimposed
vascular and osseous structures. Chest CT (with contrast if
feasible) is recommended for further characterization.
2. Subtle blunting of the left lateral costophrenic angle suggesting
small left pleural effusion.
3. Faint reticular interstitial accentuation in the lung bases,
nonspecific but possibly due to low grade inflammation or drug
reaction.
4. Atherosclerotic aortic arch.
5. Suspected emphysema.
These results will be called to the ordering clinician or
representative by the Radiologist Assistant, and communication
documented in the PACS Dashboard.

## 2015-10-23 IMAGING — CT CT CHEST W/ CM
2 of 3 series · 15 of 36 positions shown, 18 images · IV contrast (OMNIPAQUE)
Comparison: DG CHEST 1V PORT dated 08/29/2013

CLINICAL DATA: Pneumonia. Abnormal chest x-ray. History of bladder
cancer.

EXAM:
CT CHEST WITH CONTRAST
TECHNIQUE: Multidetector CT imaging of the chest was performed during
intravenous contrast administration.
CONTRAST:  100mL OMNIPAQUE IOHEXOL 300 MG/ML  SOLN

[Series 2: chest with st · axial · 0.88mm/px · z∈[-121,+144]mm · 12 of 63 slices shown, 15 images]
[im 5/63  mediastinal]
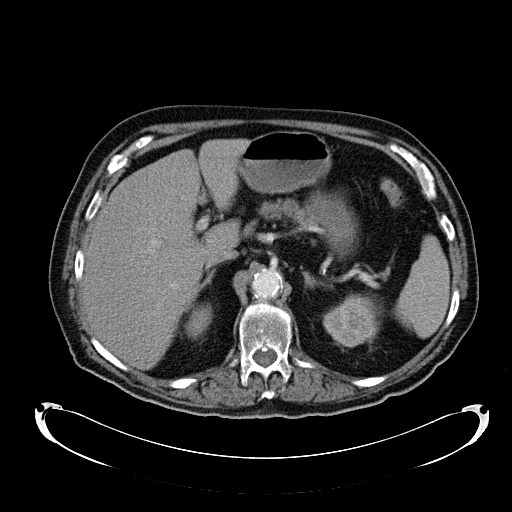
[im 5/63  lung]
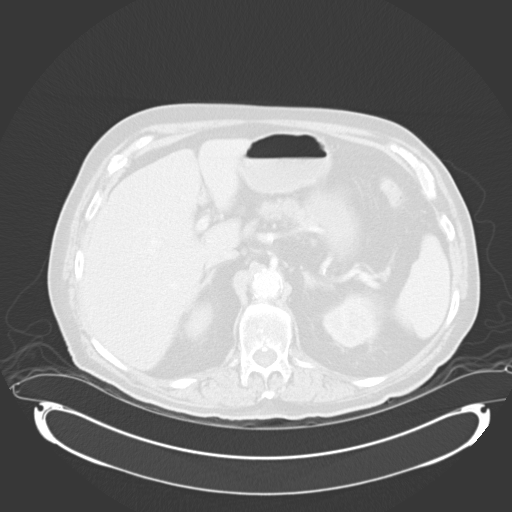
[im 10/63  lung]
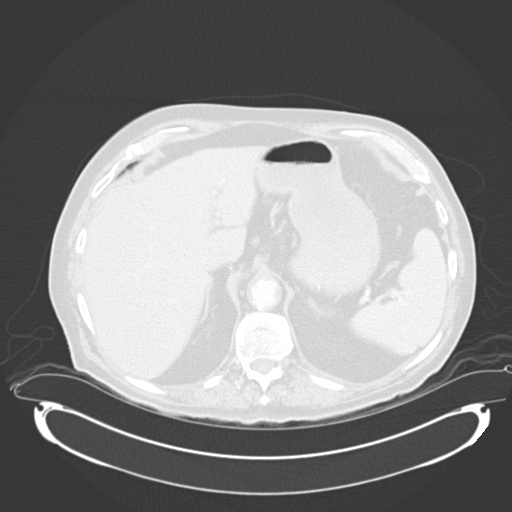
[im 14/63  lung]
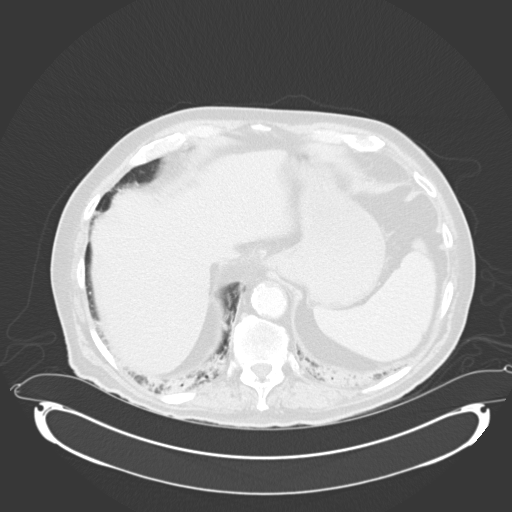
[im 19/63  lung]
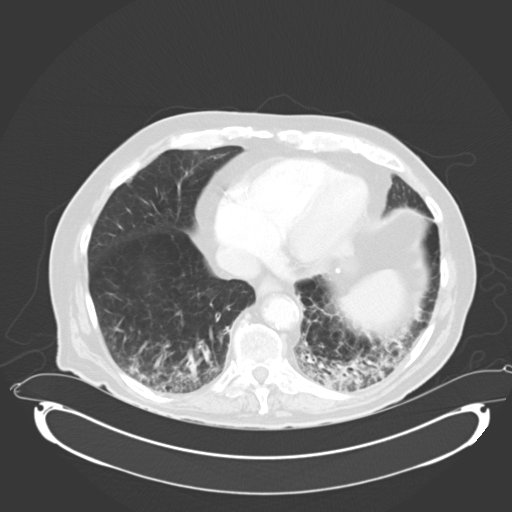
[im 23/63  mediastinal]
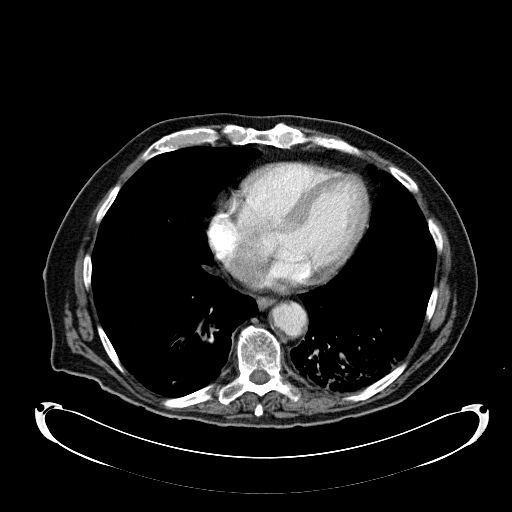
[im 23/63  lung]
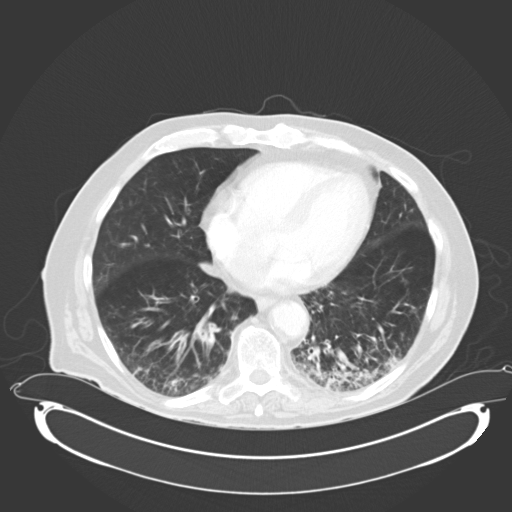
[im 28/63  lung]
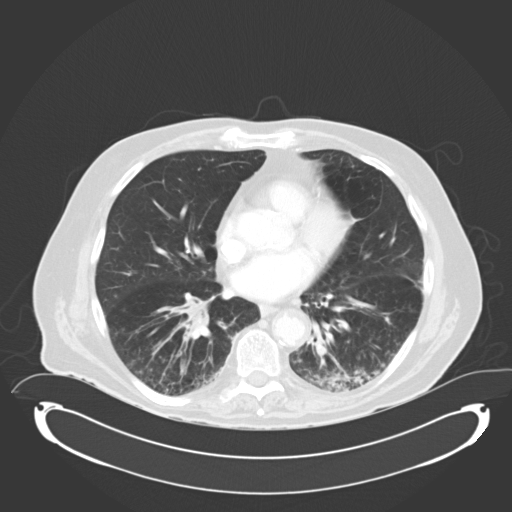
[im 35/63  lung]
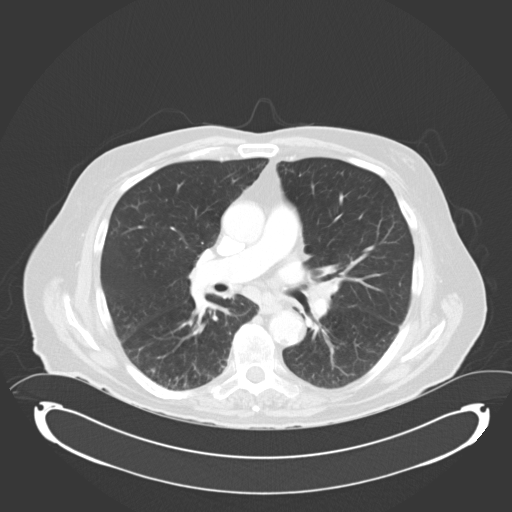
[im 40/63  lung]
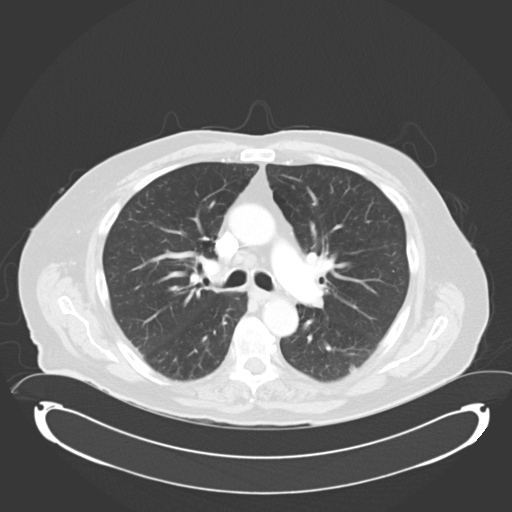
[im 44/63  mediastinal]
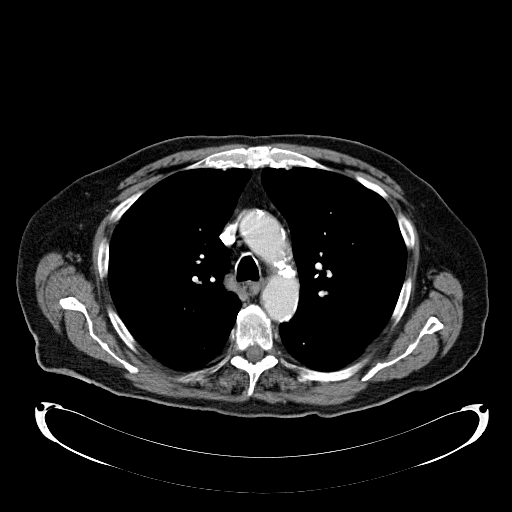
[im 44/63  lung]
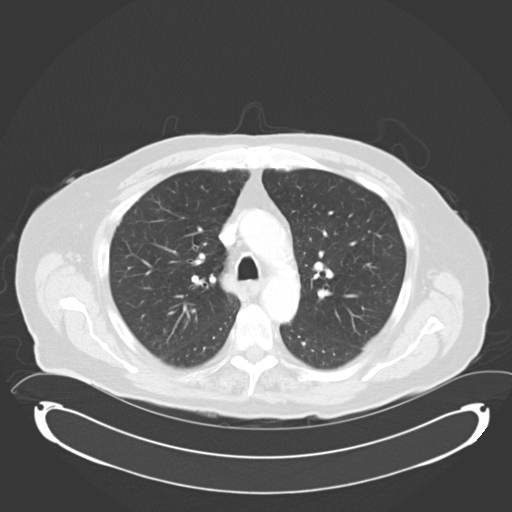
[im 49/63  lung]
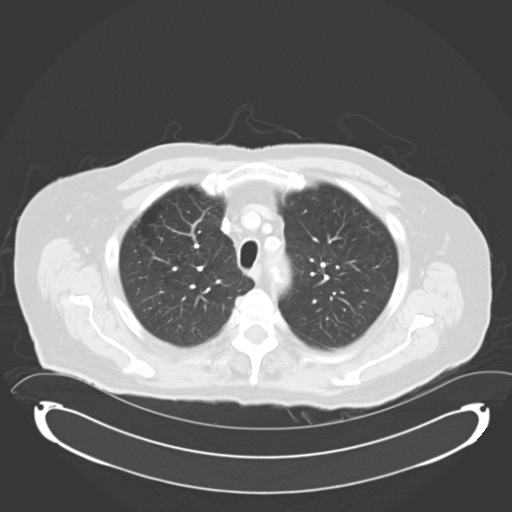
[im 53/63  lung]
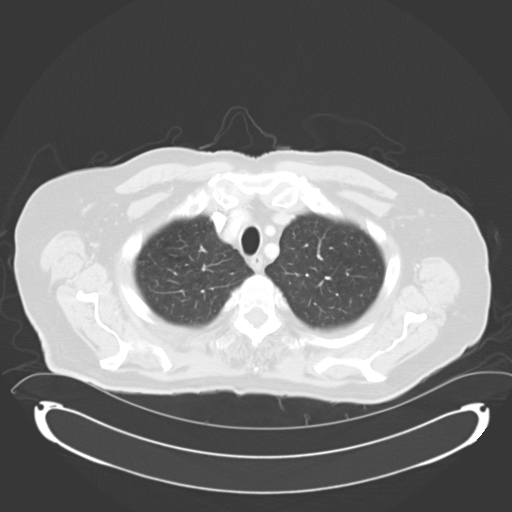
[im 58/63  lung]
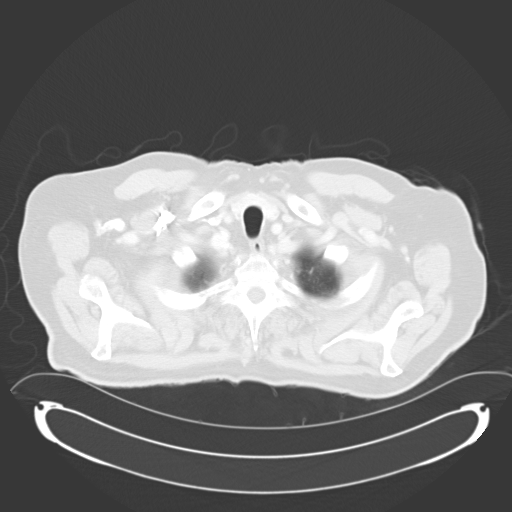

[Series 602: <mpr thick range> · coronal · 0.88mm/px · 3 of 89 slices shown]
[im 18/89  lung]
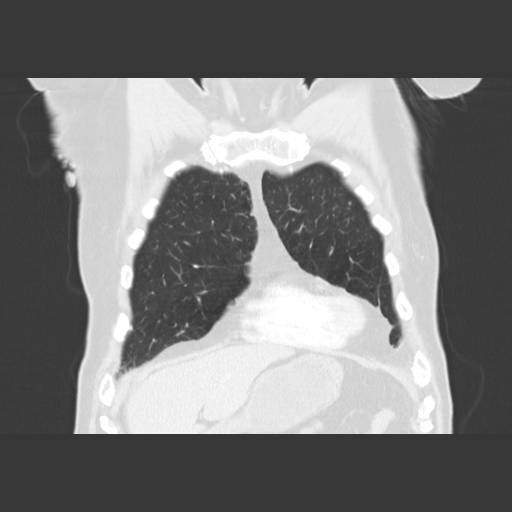
[im 36/89  lung]
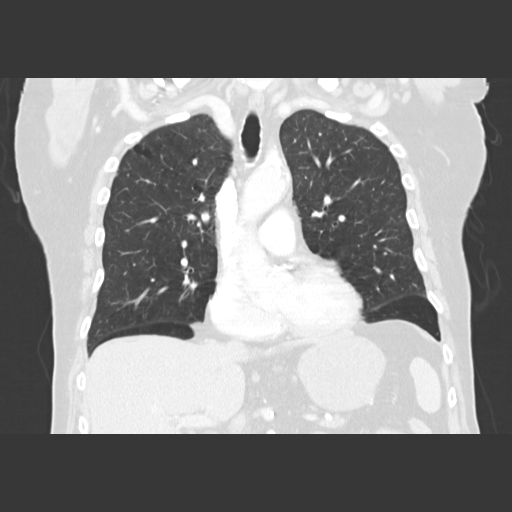
[im 53/89  lung]
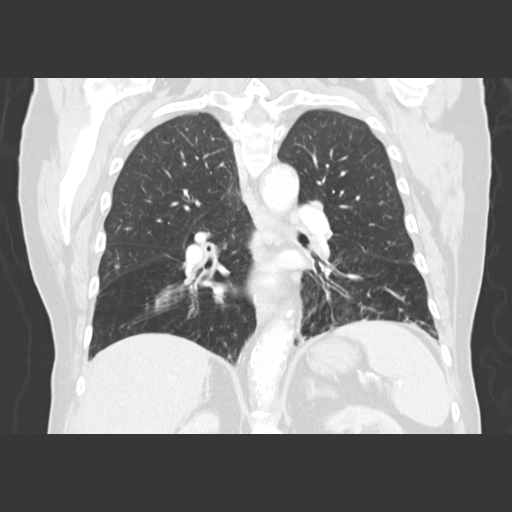

[15 of 36 positions shown; findings below may reference images not displayed]

FINDINGS: Mild emphysema is present in the upper lobes. There is no upper lobe
airspace disease. Scattered areas of tree-in-bud micro nodularity or
present diffusely in the lower lobes. Additionally, there is
bronchial wall thickening compatible with chronic bronchitis.
Collapse and some consolidation of both posterior basal lower lobes
is present, suggesting chronic infection including atypical such as
mycobacterium avium. Mycobacterium avium is commonly associated with
the tree in bud pattern although the distribution is somewhat
atypical with sparing of the anterior basis. Coronary artery
atherosclerosis is present. If office based assessment of coronary
risk factors has not been performed, it is now recommended. There is
no pericardial effusion. No pleural effusion. Incidental imaging of
the upper abdomen is within normal limits. Tiny calcification is
present along the superior gastric fundus. Aortic and branch vessel
atherosclerosis is present with shaggy mural plaque extending into
the upper abdomen. Old granulomatous disease of the liver.
Osteopenia is present. No destructive osseous lesions of the
thoracic spine. The sternum appears within normal limits.

There is no axillary adenopathy. Bilateral hilar adenopathy is
present, probably reactive. Right hilar lymph node measures 16 mm
short axis (image 34 series 2). Left hilar lymph node measures 11 mm
short axis (image 30 series 2).

Notably, the opacity at the apices on radiography was due in part to
projection and extensive costochondral calcification. The only
architectural distortion of the pulmonary parenchyma at the apices
is due to emphysema.
IMPRESSION: 1. Bilateral lower lobe bronchial wall thickening and tree-in-bud
micronodularity. This is most commonly associated with infection but
can also be seen in chronic aspiration. Mycobacterium avium is in
the differential considerations although the pattern and
distribution is somewhat atypical.
2. Emphysema.
3. Bilateral hilar adenopathy is probably reactive.
4. Atherosclerosis and coronary artery disease.

## 2016-01-02 ENCOUNTER — Other Ambulatory Visit: Payer: Self-pay | Admitting: Nurse Practitioner
